# Patient Record
Sex: Male | Born: 1940 | Race: White | Hispanic: No | Marital: Single | Smoking: Former smoker
Health system: Southern US, Community
[De-identification: ages and names within clinical notes are randomized; demographics above are authoritative.]

## PROBLEM LIST (undated history)

## (undated) DIAGNOSIS — I5042 Chronic combined systolic (congestive) and diastolic (congestive) heart failure: Secondary | ICD-10-CM

## (undated) DIAGNOSIS — K5730 Diverticulosis of large intestine without perforation or abscess without bleeding: Secondary | ICD-10-CM

## (undated) DIAGNOSIS — J45909 Unspecified asthma, uncomplicated: Secondary | ICD-10-CM

## (undated) DIAGNOSIS — H547 Unspecified visual loss: Secondary | ICD-10-CM

## (undated) DIAGNOSIS — I4891 Unspecified atrial fibrillation: Secondary | ICD-10-CM

## (undated) DIAGNOSIS — R011 Cardiac murmur, unspecified: Secondary | ICD-10-CM

## (undated) DIAGNOSIS — K859 Acute pancreatitis without necrosis or infection, unspecified: Secondary | ICD-10-CM

## (undated) DIAGNOSIS — E119 Type 2 diabetes mellitus without complications: Secondary | ICD-10-CM

## (undated) DIAGNOSIS — J449 Chronic obstructive pulmonary disease, unspecified: Secondary | ICD-10-CM

## (undated) DIAGNOSIS — I350 Nonrheumatic aortic (valve) stenosis: Secondary | ICD-10-CM

## (undated) DIAGNOSIS — I6340 Cerebral infarction due to embolism of unspecified cerebral artery: Secondary | ICD-10-CM

## (undated) DIAGNOSIS — I499 Cardiac arrhythmia, unspecified: Secondary | ICD-10-CM

## (undated) DIAGNOSIS — H353190 Nonexudative age-related macular degeneration, unspecified eye, stage unspecified: Secondary | ICD-10-CM

## (undated) DIAGNOSIS — Z7409 Other reduced mobility: Secondary | ICD-10-CM

## (undated) DIAGNOSIS — R0609 Other forms of dyspnea: Secondary | ICD-10-CM

## (undated) DIAGNOSIS — R5381 Other malaise: Secondary | ICD-10-CM

## (undated) DIAGNOSIS — R0600 Dyspnea, unspecified: Secondary | ICD-10-CM

## (undated) DIAGNOSIS — D649 Anemia, unspecified: Secondary | ICD-10-CM

## (undated) DIAGNOSIS — G4733 Obstructive sleep apnea (adult) (pediatric): Secondary | ICD-10-CM

## (undated) DIAGNOSIS — Z953 Presence of xenogenic heart valve: Secondary | ICD-10-CM

## (undated) DIAGNOSIS — R609 Edema, unspecified: Secondary | ICD-10-CM

## (undated) DIAGNOSIS — M1990 Unspecified osteoarthritis, unspecified site: Secondary | ICD-10-CM

## (undated) DIAGNOSIS — Z8679 Personal history of other diseases of the circulatory system: Secondary | ICD-10-CM

## (undated) DIAGNOSIS — Z9889 Other specified postprocedural states: Secondary | ICD-10-CM

## (undated) DIAGNOSIS — I2510 Atherosclerotic heart disease of native coronary artery without angina pectoris: Secondary | ICD-10-CM

## (undated) DIAGNOSIS — I10 Essential (primary) hypertension: Secondary | ICD-10-CM

## (undated) DIAGNOSIS — I639 Cerebral infarction, unspecified: Secondary | ICD-10-CM

## (undated) DIAGNOSIS — R911 Solitary pulmonary nodule: Secondary | ICD-10-CM

## (undated) DIAGNOSIS — Z951 Presence of aortocoronary bypass graft: Secondary | ICD-10-CM

## (undated) DIAGNOSIS — E785 Hyperlipidemia, unspecified: Secondary | ICD-10-CM

## (undated) HISTORY — PX: TONSILLECTOMY: SUR1361

## (undated) HISTORY — DX: Edema, unspecified: R60.9

## (undated) HISTORY — PX: EYE SURGERY: SHX253

## (undated) HISTORY — DX: Nonrheumatic aortic (valve) stenosis: I35.0

## (undated) HISTORY — DX: Solitary pulmonary nodule: R91.1

## (undated) HISTORY — DX: Chronic combined systolic (congestive) and diastolic (congestive) heart failure: I50.42

## (undated) HISTORY — DX: Diverticulosis of large intestine without perforation or abscess without bleeding: K57.30

## (undated) HISTORY — DX: Obstructive sleep apnea (adult) (pediatric): G47.33

## (undated) HISTORY — DX: Other malaise: R53.81

## (undated) HISTORY — PX: CHOLECYSTECTOMY: SHX55

## (undated) HISTORY — DX: Other reduced mobility: Z74.09

## (undated) HISTORY — DX: Dyspnea, unspecified: R06.00

## (undated) HISTORY — DX: Anemia, unspecified: D64.9

## (undated) HISTORY — DX: Unspecified atrial fibrillation: I48.91

## (undated) HISTORY — DX: Nonexudative age-related macular degeneration, unspecified eye, stage unspecified: H35.3190

## (undated) HISTORY — DX: Morbid (severe) obesity due to excess calories: E66.01

## (undated) HISTORY — DX: Type 2 diabetes mellitus without complications: E11.9

## (undated) HISTORY — DX: Hyperlipidemia, unspecified: E78.5

## (undated) HISTORY — DX: Other forms of dyspnea: R06.09

## (undated) HISTORY — DX: Unspecified visual loss: H54.7

## (undated) HISTORY — DX: Atherosclerotic heart disease of native coronary artery without angina pectoris: I25.10

## (undated) HISTORY — DX: Unspecified osteoarthritis, unspecified site: M19.90

## (undated) HISTORY — DX: Chronic obstructive pulmonary disease, unspecified: J44.9

## (undated) HISTORY — DX: Acute pancreatitis without necrosis or infection, unspecified: K85.90

---

## 1995-03-02 DIAGNOSIS — I639 Cerebral infarction, unspecified: Secondary | ICD-10-CM

## 1995-03-02 HISTORY — DX: Cerebral infarction, unspecified: I63.9

## 2003-12-05 ENCOUNTER — Encounter: Payer: Self-pay | Admitting: Internal Medicine

## 2003-12-13 ENCOUNTER — Encounter: Payer: Self-pay | Admitting: Emergency Medicine

## 2003-12-19 ENCOUNTER — Encounter: Payer: Self-pay | Admitting: Internal Medicine

## 2008-04-18 ENCOUNTER — Encounter: Payer: Self-pay | Admitting: Internal Medicine

## 2009-07-04 ENCOUNTER — Encounter: Payer: Self-pay | Admitting: Internal Medicine

## 2009-08-07 LAB — HM COLONOSCOPY

## 2009-10-29 ENCOUNTER — Encounter: Payer: Self-pay | Admitting: Internal Medicine

## 2009-10-31 ENCOUNTER — Encounter: Payer: Self-pay | Admitting: Internal Medicine

## 2009-11-18 ENCOUNTER — Ambulatory Visit: Payer: Self-pay | Admitting: Internal Medicine

## 2009-11-24 DIAGNOSIS — H269 Unspecified cataract: Secondary | ICD-10-CM

## 2009-11-24 DIAGNOSIS — I2510 Atherosclerotic heart disease of native coronary artery without angina pectoris: Secondary | ICD-10-CM | POA: Insufficient documentation

## 2009-11-24 DIAGNOSIS — D649 Anemia, unspecified: Secondary | ICD-10-CM

## 2009-11-24 DIAGNOSIS — M25519 Pain in unspecified shoulder: Secondary | ICD-10-CM

## 2009-11-24 DIAGNOSIS — Z8719 Personal history of other diseases of the digestive system: Secondary | ICD-10-CM | POA: Insufficient documentation

## 2009-11-24 DIAGNOSIS — G4733 Obstructive sleep apnea (adult) (pediatric): Secondary | ICD-10-CM | POA: Insufficient documentation

## 2009-11-24 DIAGNOSIS — M1990 Unspecified osteoarthritis, unspecified site: Secondary | ICD-10-CM | POA: Insufficient documentation

## 2009-11-24 DIAGNOSIS — H353190 Nonexudative age-related macular degeneration, unspecified eye, stage unspecified: Secondary | ICD-10-CM | POA: Insufficient documentation

## 2009-11-24 DIAGNOSIS — Z9989 Dependence on other enabling machines and devices: Secondary | ICD-10-CM

## 2009-12-18 ENCOUNTER — Ambulatory Visit: Payer: Self-pay | Admitting: Internal Medicine

## 2009-12-27 ENCOUNTER — Encounter: Payer: Self-pay | Admitting: Internal Medicine

## 2009-12-31 ENCOUNTER — Ambulatory Visit: Payer: Self-pay | Admitting: Emergency Medicine

## 2010-01-13 ENCOUNTER — Ambulatory Visit: Payer: Self-pay | Admitting: Internal Medicine

## 2010-01-13 LAB — CONVERTED CEMR LAB
ALT: 14 units/L (ref 0–53)
Albumin: 3.8 g/dL (ref 3.5–5.2)
CO2: 31 meq/L (ref 19–32)
GFR calc non Af Amer: 61.65 mL/min (ref >60)
Glucose, Bld: 72 mg/dL (ref 70–99)
HDL: 33.7 mg/dL — ABNORMAL LOW (ref >39.00)
Potassium: 4.3 meq/L (ref 3.5–5.1)
Sodium: 138 meq/L (ref 135–145)
Total Bilirubin: 0.9 mg/dL (ref 0.3–1.2)
Total CHOL/HDL Ratio: 4
Triglycerides: 104 mg/dL (ref 0.0–149.0)
VLDL: 20.8 mg/dL (ref 0.0–40.0)

## 2010-01-15 ENCOUNTER — Encounter: Payer: Self-pay | Admitting: Internal Medicine

## 2010-02-02 ENCOUNTER — Telehealth: Payer: Self-pay | Admitting: Internal Medicine

## 2010-02-05 ENCOUNTER — Encounter: Payer: Self-pay | Admitting: Emergency Medicine

## 2010-02-09 ENCOUNTER — Ambulatory Visit: Payer: Self-pay | Admitting: Internal Medicine

## 2010-02-09 ENCOUNTER — Telehealth: Payer: Self-pay | Admitting: Internal Medicine

## 2010-02-11 ENCOUNTER — Ambulatory Visit: Payer: Self-pay | Admitting: Emergency Medicine

## 2010-02-27 ENCOUNTER — Telehealth (INDEPENDENT_AMBULATORY_CARE_PROVIDER_SITE_OTHER): Payer: Self-pay | Admitting: *Deleted

## 2010-03-17 ENCOUNTER — Ambulatory Visit: Payer: Self-pay | Admitting: Internal Medicine

## 2010-03-19 ENCOUNTER — Encounter (INDEPENDENT_AMBULATORY_CARE_PROVIDER_SITE_OTHER): Payer: Self-pay | Admitting: *Deleted

## 2010-03-19 DIAGNOSIS — K1120 Sialoadenitis, unspecified: Secondary | ICD-10-CM | POA: Insufficient documentation

## 2010-03-23 ENCOUNTER — Ambulatory Visit: Admit: 2010-03-23 | Payer: Self-pay | Admitting: Internal Medicine

## 2010-03-23 ENCOUNTER — Encounter: Payer: Self-pay | Admitting: Emergency Medicine

## 2010-03-24 ENCOUNTER — Encounter (INDEPENDENT_AMBULATORY_CARE_PROVIDER_SITE_OTHER): Payer: Self-pay | Admitting: *Deleted

## 2010-03-31 ENCOUNTER — Encounter: Payer: Self-pay | Admitting: Internal Medicine

## 2010-03-31 NOTE — Medication Information (Signed)
Summary: Glucose Supplies/[facility]  Glucose Supplies/[facility]   Imported By: Sherian Rein 11/25/2009 09:14:41  _____________________________________________________________________  External Attachment:    Type:   Image     Comment:   External Document

## 2010-03-31 NOTE — Letter (Signed)
Summary: CMN/[facility]  CMN/[facility]   Imported By: Lester Cumberland 12/30/2009 09:39:20  _____________________________________________________________________  External Attachment:    Type:   Image     Comment:   External Document

## 2010-03-31 NOTE — Assessment & Plan Note (Signed)
Summary: NEW PT /MEDICARE / [name]  #   Vital Signs:  Patient profile:   70 year old male Height:      73 inches Weight:      326 pounds BMI:     43.17 O2 Sat:      93 % on Room air Temp:     97.1 degrees F oral Pulse rate:   68 / minute BP sitting:   142 / 62  (left arm) Cuff size:   large  Vitals Entered By: Ami Bullins CMA (November 18, 2009 1:21 PM)  O2 Flow:  Room air CC: New pt here to est care with primary md/ ab   Primary Care Provider:  Jacques Navy MD  CC:  New pt here to est care with primary md/ ab.  History of Present Illness: Patient presents to [facility] for on-going care.   Patient is diabetic - he is well supplied with medications but needs insulin.  He is moving from [place] , [place] - he is a native of [place] and has decided to return home.   He reports that he is medically stable but does suffer from SOB and has some difficulty with steps/stairs.  He remains fully independent in all ADLs and manages all his affairs, including rental property and investment accounts..   Preventive Screening-Counseling & Management  Alcohol-Tobacco     Alcohol drinks/day: <1     Alcohol type: wine, beer,scotch     >5/day in last 3 mos: yes     Smoking Status: quit     Year Quit: 1996  Caffeine-Diet-Exercise     Caffeine use/day: 1 cup daily     Diet Comments: diabetic diet     Does Patient Exercise: no     MSH Depression Score: no depression  Hep-HIV-STD-Contraception     Hepatitis Risk: no risk noted     HIV Risk: no risk noted     STD Risk Counseling: not indicated-no STD risk noted     Dental Visit-last 6 months no     Dental Care Counseling: to seek dental care; no dental care within six months     Sun Exposure-Excessive: no  Safety-Violence-Falls     Seat Belt Use: yes     Helmet Use: yes     Firearms in the Home: no firearms in the home     Smoke Detectors: yes     Violence in the Home: no risk noted     Sexual Abuse: no      Drug  Use:  never.        Blood Transfusions:  yes and after 2001.    Current Medications (verified): 1)  Dorzolamide Hcl 2 % Soln (Dorzolamide Hcl) .Marland Kitchen.. 1 Drop Right Eye Once Daily 2)  Novolin N 100 Unit/ml Susp (Insulin Isophane Human) .... Inject 15 Units Subcutaneously Two Times A Day 3)  Onetouch Test  Strp (Glucose Blood) .Marland Kitchen.. 1 Strip Three Times A Day 4)  Proair Hfa 108 (90 Base) Mcg/act Aers (Albuterol Sulfate) .... Two Puffs Q 4 To 6 Hours 5)  Atenolol 50 Mg Tabs (Atenolol) .... One Tab At Bedtime 6)  Hydrochlorothiazide 25 Mg Tabs (Hydrochlorothiazide) .... One Tab Once Daily 7)  Lisinopril 20 Mg Tabs (Lisinopril) .... One Tab Daily 8)  Metformin Hcl 500 Mg Tabs (Metformin Hcl) .... One Tab Qam and Two Tabs Qpm 9)  Novolin R 100 Unit/ml Soln (Insulin Regular Human) .... Inject 15-20 Units Sub Q Twice Daily Before Breakfast  and Before Dinner 10)  Simvastatin 40 Mg Tabs (Simvastatin) .... Take One Tab Qpm 11)  Ofloxacin 0.3 % Soln (Ofloxacin) .... One Drop Three Times A Day After A Shot 12)  Spiriva Handihaler 18 Mcg Caps (Tiotropium Bromide Monohydrate) .... 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 Smoking Status:  quit Caffeine use/day:  1 cup daily Does Patient Exercise:  no Dental Care w/in 6 mos.:  no Sun Exposure-Excessive:  no Seat Belt Use:  yes Drug Use:  never Hepatitis Risk:  no risk noted HIV Risk:  no risk noted Blood Transfusions:  yes, after 2001  Review of Systems       The patient complains of difficulty walking.  The patient denies anorexia, fever, weight loss, weight gain, decreased hearing, hoarseness, chest pain, syncope, dyspnea on exertion, prolonged cough, headaches, abdominal pain, severe indigestion/heartburn, incontinence, suspicious skin lesions, depression, unusual weight change, abnormal bleeding, enlarged lymph nodes, and testicular masses.    Physical Exam  General:  obese white male in no distress Head:  Normocephalic and atraumatic without obvious abnormalities. No apparent alopecia or balding. Eyes:  C&S injected OD, pupil round and reactive. Blind OS Ears:  R ear normal and L ear normal.   Mouth:  partial dentures, nor oral lesions. Throat clear Neck:  supple, full ROM, no thyromegaly, and no carotid bruits.   Chest Wall:  No deformities, masses, tenderness or gynecomastia noted. Lungs:  Normal respiratory effort, chest expands symmetrically. Lungs are clear to auscultation, no crackles or wheezes. Heart:  Normal rate and regular rhythm. S1 and S2 normal without gallop, murmur, click, rub or other extra sounds. Abdomen:  obese,soft and normal bowel sounds.   Msk:  no joint swelling, no joint warmth, no redness over joints, and no joint deformities.   Pulses:  2+ radial Neurologic:  alert & oriented X3, cranial nerves II-XII intact except for blindness OS, and gait normal.   Skin:  turgor normal and color normal.   Cervical Nodes:  no anterior cervical adenopathy and no posterior cervical adenopathy.   Psych:  Oriented X3,  normally interactive, good eye contact, and not anxious appearing.    Diabetes Management Exam:    Eye Exam:       Eye Exam done elsewhere          Date: 11/17/2009          Results: normal          Done by: Dr Allyne Gee   Impression & Recommendations:  Problem # 1:  OBESITY, MORBID (ICD-278.01) Discussed weight management: smar food choices, PORTION SIZE CONTROL, and regular exercise.  Problem # 2:  COPD (ICD-496) He does report DOE. No Increased work of breathing at rest.  Plan - continue present medications.  His updated medication list for this problem includes:    Proair Hfa 108 (  90 Base) Mcg/act Aers (Albuterol sulfate) .Marland Kitchen..Marland Kitchen Two puffs q 4 to 6 hours    Spiriva Handihaler 18 Mcg Caps (Tiotropium bromide monohydrate) .Marland Kitchen... As needed  Problem # 3:  OBSTRUCTIVE SLEEP APNEA (ICD-327.23) Using CPAP and had recent titration study. 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 Had a negative PET scan. Will need annual follow-up: last study 04/18/08  Problem # 6:  OTHER AND UNSPECIFIED HYPERLIPIDEMIA (ICD-272.4) Will look for recent lab: needs q6 months follow-up  His updated medication list for this problem includes:    Simvastatin 40 Mg Tabs (Simvastatin) .Marland Kitchen... Take one tab qpm  Problem # 7:  DIABETES-TYPE 2 (ICD-250.00) On complex regimen but by his report he has been controlled and stable. Will need to review recent lab and he will need q 3-6 month follow-up.  His updated medication list for this problem includes:    Novolin N 100 Unit/ml Susp (Insulin isophane human) ..... Inject 15 units subcutaneously two times a day    Lisinopril 20 Mg Tabs (Lisinopril) ..... One tab daily    Metformin Hcl 500 Mg Tabs (Metformin hcl) ..... One tab qam and two tabs qpm    Novolin  R 100 Unit/ml Soln (Insulin regular human) ..... Inject 15-20 units sub q twice daily before breakfast and before dinner  Problem # 8:  CAD (ICD-414.00) Many risk factors and he does appear to have a program for risk reduction. At some point will need to establish with cardiology.  His updated medication list for this problem includes:    Atenolol 50 Mg Tabs (Atenolol) ..... One tab at bedtime    Hydrochlorothiazide 25 Mg Tabs (Hydrochlorothiazide) ..... One tab once daily    Lisinopril 20 Mg Tabs (Lisinopril) ..... One tab daily  Problem # 9:  Preventive Health Care (ICD-V70.0)  Current with colonscopy, current with opthalmology. Will be obtaining lab reports and will order lab as appropriate. His immunizations are up to date.  He has no signs of depression and has a good attitude. He is cognitively intact - see HPI. He has fall risk related to obesity and visual loss and is counselled on safety and fall risk reduction. He is counselled on diet management.   In summary- a nice man who presents to establish for on-going care. He will return in 4-8 weeks for follow-up.  Orders: [facility] -Subsequent Annual Wellness Visit [phone])  Complete Medication List: 1)  Dorzolamide Hcl 2 % Soln (Dorzolamide hcl) .Marland Kitchen.. 1 drop right eye once daily 2)  Novolin N 100 Unit/ml Susp (Insulin isophane human) .... Inject 15 units subcutaneously two times a day 3)  Onetouch Test Strp (Glucose blood) .Marland Kitchen.. 1 strip three times a day 4)  Proair Hfa 108 (90 Base) Mcg/act Aers (Albuterol sulfate) .... Two puffs q 4 to 6 hours 5)  Atenolol 50 Mg Tabs (Atenolol) .... One tab at bedtime 6)  Hydrochlorothiazide 25 Mg Tabs (Hydrochlorothiazide) .... One tab once daily 7)  Lisinopril 20 Mg Tabs (Lisinopril) .... One tab daily 8)  Metformin Hcl 500 Mg Tabs (Metformin hcl) .... One tab qam and two tabs qpm 9)  Novolin R 100 Unit/ml Soln (Insulin regular human) .... Inject 15-20 units sub q twice daily before breakfast and before  dinner 10)  Simvastatin 40 Mg Tabs (Simvastatin) .... Take one tab qpm  11)  Ofloxacin 0.3 % Soln (Ofloxacin) .... One drop three times a day after a shot 12)  Spiriva Handihaler 18 Mcg Caps (Tiotropium bromide monohydrate) .... As needed  Other Orders: Flu Vaccine 59yrs + MEDICARE PATIENTS (Q5956) Administration Flu vaccine - MCR (L8756) Prescriptions: SPIRIVA HANDIHALER 18 MCG CAPS (TIOTROPIUM BROMIDE MONOHYDRATE) as needed  #1 x 12   Entered by:   Ami Bullins CMA   Authorized by:   Jacques Navy MD   Signed by:   Bill Salinas CMA on 11/18/2009   Method used:   Electronically to        [facility]* (retail)       [address]       [place], [place]  [address]       Ph: [phone]       Fax: [phone]   RxID:   [id number] SIMVASTATIN 40 MG TABS (SIMVASTATIN) take one tab qpm  #30 x 12   Entered by:   Ami Bullins CMA   Authorized by:   Jacques Navy MD   Signed by:   Bill Salinas CMA on 11/18/2009   Method used:   Electronically to        [facility]* (retail)       [address]       [place], [place]  [address]       Ph: [phone]       Fax: [phone]   RxID:   [id number] NOVOLIN R 100 UNIT/ML SOLN (INSULIN REGULAR HUMAN) inject 15-20 units sub q twice daily before breakfast and before dinner  #1 month supp x 12   Entered by:   Ami Bullins CMA   Authorized by:   Jacques Navy MD   Signed by:   Bill Salinas CMA on 11/18/2009   Method used:   Electronically to        [facility]* (retail)       [address]       [place], [place]  [address]       Ph: [phone]       Fax: [phone]   RxID:   [id number] METFORMIN HCL 500 MG TABS (METFORMIN HCL) one tab qam and two tabs qpm  #90 x 12   Entered by:   Ami Bullins CMA   Authorized by:   Jacques Navy MD   Signed by:   Bill Salinas CMA on 11/18/2009   Method used:   Electronically to        [facility]* (retail)       [address]       [place], [place]  [address]       Ph: [phone]       Fax: [phone]   RxID:   [id number] LISINOPRIL 20 MG TABS (LISINOPRIL) one tab daily  #30 x 12   Entered by:   Ami Bullins CMA   Authorized by:   Jacques Navy MD   Signed by:   Bill Salinas CMA on 11/18/2009   Method used:   Electronically to        [facility]* (retail)       [address]       [place], [place]  [address]       Ph: [phone]       Fax: [phone]   RxID:   [id number] HYDROCHLOROTHIAZIDE 25 MG TABS (HYDROCHLOROTHIAZIDE) one tab once daily  #30 x 12  Entered by:   Bill Salinas CMA   Authorized by:   Jacques Navy MD   Signed by:   Bill Salinas CMA on 11/18/2009   Method used:   Electronically to        [facility]* (retail)       [address]       [place], [place]  [address]       Ph: [phone]       Fax: [phone]   RxID:   [id number] ATENOLOL 50 MG TABS (ATENOLOL) one tab at bedtime  #30 x 12   Entered by:   Ami Bullins CMA   Authorized by:   Jacques Navy MD   Signed by:   Bill Salinas CMA on 11/18/2009   Method used:   Electronically to        [facility]* (retail)       [address]       [place], [place]  [address]       Ph: [phone]       Fax: [phone]   RxID:   [id number] PROAIR HFA 108 (90 BASE) MCG/ACT AERS (ALBUTEROL SULFATE) two puffs q 4 to 6 hours  #1 x 12   Entered by:   Ami Bullins CMA   Authorized by:   Jacques Navy MD   Signed by:   Bill Salinas CMA on 11/18/2009   Method used:   Electronically to        [facility]* (retail)       [address]       [place], [place]  [address]       Ph: [phone]       Fax: [phone]   RxID:   [id number] [facility] TEST  STRP (GLUCOSE BLOOD) 1 strip three times a day  #1 month supp x 12   Entered by:   Ami Bullins CMA   Authorized by:   Jacques Navy MD   Signed by:   Bill Salinas CMA on 11/18/2009    Method used:   Electronically to        [facility]* (retail)       [address]       [place], [place]  [address]       Ph: [phone]       Fax: [phone]   RxID:   [id number] NOVOLIN N 100 UNIT/ML SUSP (INSULIN ISOPHANE HUMAN) Inject 15 units subcutaneously two times a day  #1 month supp x 12   Entered by:   Ami Bullins CMA   Authorized by:   Jacques Navy MD   Signed by:   Bill Salinas CMA on 11/18/2009   Method used:   Electronically to        [facility]* (retail)       [address]       [place], [place]  [address]       Ph: [phone]       Fax: [phone]   RxID:   [id number]    Preventive Care Screening  Last Flu Shot:    Date:  03/22/2008    Results:  given   Colonoscopy:    Date:  07/27/2007    Results:  Adenomatous Polyp   Last Pneumovax:    Date:  11/22/2006    Results:  given   Last Tetanus Booster:    Date:  10/02/2003    Results:  Historical  Flu Vaccine Consent Questions     Do you have a history of severe allergic reactions to this vaccine? no    Any prior history of allergic reactions to egg and/or gelatin? no    Do you have a sensitivity to the preservative Thimersol? no    Do you have a past history of Guillan-Barre Syndrome? no    Do you currently have an acute febrile illness? no    Have you ever had a severe reaction to latex? no    Vaccine information given and explained to patient? yes    Are you currently pregnant? no    Lot Number:[id number]   Exp Date:08/29/2010   Site Given  Left Deltoid IMedflu

## 2010-03-31 NOTE — Miscellaneous (Signed)
Summary: Orders Update  Clinical Lists Changes  Orders: Added new Referral order of Sleep Disorder Referral (Sleep Disorder) - Signed 

## 2010-03-31 NOTE — Miscellaneous (Signed)
Summary: [facility]   Imported By: Sherian Rein 11/28/2009 10:45:52  _____________________________________________________________________  External Attachment:    Type:   Image     Comment:   External Document

## 2010-03-31 NOTE — Progress Notes (Signed)
Summary: REQ FOR RX  Phone Note Call from Patient Call back at [phone]   Summary of Call: Patient is requesting rx to help with "flare" of rosacea.  Initial call taken by: Lamar Sprinkles, CMA,  February 02, 2010 2:10 PM    New/Updated Medications: METROGEL 1 % GEL (METRONIDAZOLE) apply daily to affected area of rosacea. May d/c when flare is under control. Prescriptions: METROGEL 1 % GEL (METRONIDAZOLE) apply daily to affected area of rosacea. May d/c when flare is under control.  #60g x 2   Entered and Authorized by:   Jacques Navy MD   Signed by:   Jacques Navy MD on 02/02/2010   Method used:   Electronically to        [facility]* (retail)       [address]       [place], [place]  [address]       Ph: [phone]       Fax: [phone]   RxID:   [id number]

## 2010-03-31 NOTE — Assessment & Plan Note (Signed)
Summary: 1 mos f.u    Vital Signs:  Patient profile:   70 year old male Height:      73 inches Weight:      321 pounds BMI:     42.50 O2 Sat:      94 % on Room air Temp:     97.4 degrees F oral Pulse rate:   67 / minute BP sitting:   124 / 64  (left arm) Cuff size:   large  Vitals Entered By: Bill Salinas CMA (December 18, 2009 2:14 PM)  O2 Flow:  Room air  Primary Care Breken Nazari:  Jacques Navy MD   History of Present Illness: Patient recently seen as a new patient. He reports to additions or corrections to the history as mailed to him. He has brought recent lab reports from my review. He has no additional problems or concerns.  Current Medications (verified): 1)  Dorzolamide Hcl 2 % Soln (Dorzolamide Hcl) .Marland Kitchen.. 1 Drop Right Eye Once Daily 2)  Novolin N 100 Unit/ml Susp (Insulin Isophane Human) .... Inject 20 Units Subcutaneously Every Morning and 30 Units At Bedtime 3)  Onetouch Test  Strp (Glucose Blood) .Marland Kitchen.. 1 Strip Three Times A Day 4)  Proair Hfa 108 (90 Base) Mcg/act Aers (Albuterol Sulfate) .... Two Puffs Q 4 To 6 Hours 5)  Atenolol 50 Mg Tabs (Atenolol) .... One Tab At Bedtime 6)  Hydrochlorothiazide 25 Mg Tabs (Hydrochlorothiazide) .... One Tab Once Daily 7)  Lisinopril 20 Mg Tabs (Lisinopril) .... One Tab Daily 8)  Metformin Hcl 500 Mg Tabs (Metformin Hcl) .... One Tab Qam and Two Tabs Qpm 9)  Novolin R 100 Unit/ml Soln (Insulin Regular Human) .... Inject 15-20 Units Sub Q Twice Daily Before Breakfast and Before Dinner and 5-15 At Bedtime 10)  Simvastatin 40 Mg Tabs (Simvastatin) .... Take One Tab Qpm 11)  Ofloxacin 0.3 % Soln (Ofloxacin) .... One Drop Three Times A Day After A Shot 12)  Spiriva Handihaler 18 Mcg Caps (Tiotropium Bromide Monohydrate) .... As Needed 13)  Fish Oil 1000 Mg Caps (Omega-3 Fatty Acids) .Marland Kitchen.. 1 Capsule Once Daily 14)  Preservision/lutein  Caps (Multiple Vitamins-Minerals) .... 2 Capsules Daily  Allergies (verified): No Known Drug  Allergies PMH-FH-SH reviewed-no changes except otherwise noted  Physical Exam  General:  obese white male in no distress Eyes:  vision grossly intact, pupils equal, pupils round, and corneas and lenses clear.   Lungs:  normal respiratory effort.   Heart:  normal rate and regular rhythm.     Impression & Recommendations:  Problem # 1:  OBESITY, MORBID (ICD-278.01) reviewed labs and previous notes. He is encouraged to follow a weight management program  Problem # 2:  COPD (ICD-496)  He will continue his present regimen. He does need to establish with a pulmonary doctor.  His updated medication list for this problem includes:    Proair Hfa 108 (90 Base) Mcg/act Aers (Albuterol sulfate) .Marland Kitchen..Marland Kitchen Two puffs q 4 to 6 hours    Spiriva Handihaler 18 Mcg Caps (Tiotropium bromide monohydrate) .Marland Kitchen... As needed  Orders: Pulmonary Referral (Pulmonary)  Complete Medication List: 1)  Dorzolamide Hcl 2 % Soln (Dorzolamide hcl) .Marland Kitchen.. 1 drop right eye once daily 2)  Novolin N 100 Unit/ml Susp (Insulin isophane human) .... Inject 20 units subcutaneously every morning and 30 units at bedtime 3)  Onetouch Test Strp (Glucose blood) .Marland Kitchen.. 1 strip three times a day 4)  Proair Hfa 108 (90 Base) Mcg/act Aers (Albuterol sulfate) .... Two  puffs q 4 to 6 hours 5)  Atenolol 50 Mg Tabs (Atenolol) .... One tab at bedtime 6)  Hydrochlorothiazide 25 Mg Tabs (Hydrochlorothiazide) .... One tab once daily 7)  Lisinopril 20 Mg Tabs (Lisinopril) .... One tab daily 8)  Metformin Hcl 500 Mg Tabs (Metformin hcl) .... One tab qam and two tabs qpm 9)  Novolin R 100 Unit/ml Soln (Insulin regular human) .... Inject 15-20 units sub q twice daily before breakfast and before dinner and 5-15 at bedtime 10)  Simvastatin 40 Mg Tabs (Simvastatin) .... Take one tab qpm 11)  Ofloxacin 0.3 % Soln (Ofloxacin) .... One drop three times a day after a shot 12)  Spiriva Handihaler 18 Mcg Caps (Tiotropium bromide monohydrate) .... As needed 13)   Fish Oil 1000 Mg Caps (Omega-3 fatty acids) .Marland Kitchen.. 1 capsule once daily 14)  Preservision/lutein Caps (Multiple vitamins-minerals) .... 2 capsules daily Prescriptions: SIMVASTATIN 40 MG TABS (SIMVASTATIN) take one tab qpm  #90 x 3   Entered and Authorized by:   Jacques Navy MD   Signed by:   Jacques Navy MD on 12/18/2009   Method used:   Electronically to        [facility]* (retail)       [address]       [place], [place]  [address]       Ph: [phone]       Fax: [phone]   RxID:   [id number] METFORMIN HCL 500 MG TABS (METFORMIN HCL) one tab qam and two tabs qpm  #180 x 3   Entered and Authorized by:   Jacques Navy MD   Signed by:   Jacques Navy MD on 12/18/2009   Method used:   Electronically to        [facility]* (retail)       [address]       [place], [place]  [address]       Ph: [phone]       Fax: [phone]   RxID:   [id number] LISINOPRIL 20 MG TABS (LISINOPRIL) one tab daily  #90 x 3   Entered and Authorized by:   Jacques Navy MD   Signed by:   Jacques Navy MD on 12/18/2009   Method used:   Electronically to        [facility]* (retail)       [address]       [place], [place]  [address]       Ph: [phone]       Fax: [phone]   RxID:   [id number] HYDROCHLOROTHIAZIDE 25 MG TABS (HYDROCHLOROTHIAZIDE) one tab once daily  #90 x 3   Entered and Authorized by:   Jacques Navy MD   Signed by:   Jacques Navy MD on 12/18/2009   Method used:   Electronically to        [facility]* (retail)       [address]       [place], [place]  [address]       Ph: [phone]       Fax: [phone]   RxID:   [id number] ATENOLOL 50 MG TABS (ATENOLOL) one tab at bedtime  #90 x 3   Entered and Authorized by:   Jacques Navy MD   Signed by:   Jacques Navy MD on 12/18/2009   Method used:   Electronically to  [facility]*  (retail)       [address]       [place], [place]  [address]       Ph: [phone]       Fax: [phone]   RxID:   [id number] NOVOLIN R 100 UNIT/ML SOLN (INSULIN REGULAR HUMAN) inject 15-20 units sub q twice daily before breakfast and before dinner and 5-15 at bedtime  #3 vials x 12   Entered and Authorized by:   Jacques Navy MD   Signed by:   Jacques Navy MD on 12/18/2009   Method used:   Electronically to        [facility]* (retail)       [address]       [place], [place]  [address]       Ph: [phone]       Fax: [phone]   RxID:   [id number] NOVOLIN N 100 UNIT/ML SUSP (INSULIN ISOPHANE HUMAN) Inject 20 units subcutaneously every morning and 30 units at bedtime  #3 vials x 12   Entered and Authorized by:   Jacques Navy MD   Signed by:   Jacques Navy MD on 12/18/2009   Method used:   Electronically to        [facility]* (retail)       [address]       [place], [place]  [address]       Ph: [phone]       Fax: [phone]   RxID:   [id number]    Orders Added: 1)  Pulmonary Referral [Pulmonary] 2)  Est. Patient Level II [[address]]

## 2010-03-31 NOTE — Letter (Signed)
[facility] [address] [place], [place]  [address] Phone: [phone]      January 15, 2010   [name] [address] [place], [place] [address]  RE:  LAB RESULTS  Dear  Mr. Beilke,  The following is an interpretation of your most recent lab tests.  Please take note of any instructions provided or changes to medications that have resulted from your lab work.  ELECTROLYTES:  Good - no changes needed  KIDNEY FUNCTION TESTS:  Good - no changes needed  LIVER FUNCTION TESTS:  Good - no changes needed  LIPID PANEL:  Good - no changes needed Triglyceride: 104.0   Cholesterol: 150   LDL: 96   HDL: 33.70   Chol/HDL%:  4  DIABETIC STUDIES:  Good - no changes needed Blood Glucose: 72   HgbA1C: 7.4     Lab results look fine. Please come see me if you have any questions about these lab results.   Sincerely Yours,    Jacques Navy MD  Patient: Charles [name] Note: All result statuses are Final unless otherwise noted.  Tests: (1) Hemoglobin A1C (A1C)   Hemoglobin A1C       [H]  7.4 %                       4.6-6.5     Glycemic Control Guidelines for People with Diabetes:     Non Diabetic:  <6%     Goal of Therapy: <7%     Additional Action Suggested:  >8%   Tests: (2) Lipid Panel (LIPID)   Cholesterol               150 mg/dL                   5-732     ATP III Classification            Desirable:  < 200 mg/dL                    Borderline High:  200 - 239 mg/dL               High:  > = 240 mg/dL   Triglycerides             104.0 mg/dL                 2.0-254.2     Normal:  <150 mg/dL     Borderline High:  706 - 199 mg/dL   HDL                  [L]  23.76 mg/dL                 >28.31   VLDL Cholesterol          20.8 mg/dL                  5.1-76.1   LDL Cholesterol           96 mg/dL                    6-07  CHO/HDL Ratio:  CHD Risk                             4  Men          Women     1/2 Average Risk     3.4           3.3     Average Risk          5.0          4.4     2X Average Risk          9.6          7.1     3X Average Risk          15.0          11.0                           Tests: (3) Hepatic/Liver Function Panel (HEPATIC)   Total Bilirubin           0.9 mg/dL                   0.9-8.1   Direct Bilirubin          0.2 mg/dL                   1.9-1.4   Alkaline Phosphatase      61 U/L                      39-117   AST                       31 U/L                      0-37   ALT                       14 U/L                      0-53   Total Protein             6.6 g/dL                    7.8-2.9   Albumin                   3.8 g/dL                    5.6-2.1  Tests: (4) BMP (METABOL)   Sodium                    138 mEq/L                   135-145   Potassium                 4.3 mEq/L                   3.5-5.1   Chloride                  98 mEq/L                    96-112   Carbon Dioxide            31 mEq/L                    19-32   Glucose  72 mg/dL                    16-10   BUN                       23 mg/dL                    9-60   Creatinine                1.2 mg/dL                   4.5-4.0   Calcium                   9.1 mg/dL                   9.8-11.9   GFR                       61.65 mL/min                >60

## 2010-03-31 NOTE — Assessment & Plan Note (Signed)
Summary: COPD, nodule, OSA   Visit Type:  Initial Consult Copy to:  Dr. Debby Bud Primary Provider/Referring Provider:  Jacques Navy MD  CC:  Pulmonary consult for COPD...the patient c/o increased SOB with exertion.  History of Present Illness: 70 yo former smoker, hx obesity, DM, HTN, OSA on CPAP 8. Also hx gallstone pancreatitis s/p chole in the past. Dx with COPD by pulmonologist in [place] many yrs ago. Has been on combivent, more recently Spiriva + ProAir. He doesn't take the Spiriva daily, doesn't see that it helps him. he does use ProAir, feels it helps him clear sputum. Also was followed for pulm nodule, he reports was stable on serial CTs over 5 yrs.   Able to walk around the house, but has to stop to rest if he walks over a block, climbs stairs. His last PFTs were done 6 -7  mo ago in [place]. Good compliance w CPAP, pressure never rechecked since '97.   Preventive Screening-Counseling & Management  Alcohol-Tobacco     Alcohol drinks/day: <1     Smoking Status: quit     Smoking Cessation Counseling: yes     Smoke Cessation Stage: quit     Packs/Day: 2.0     Year Started: 1958     Year Quit: 1996     Tobacco Counseling: to remain off tobacco products  Current Medications (verified): 1)  Dorzolamide Hcl 2 % Soln (Dorzolamide Hcl) .Marland Kitchen.. 1 Drop Right Eye Once Daily 2)  Novolin N 100 Unit/ml Susp (Insulin Isophane Human) .... Inject 20 Units Subcutaneously Every Morning and 30 Units At Bedtime 3)  Onetouch Test  Strp (Glucose Blood) .Marland Kitchen.. 1 Strip Three Times A Day 4)  Proair Hfa 108 (90 Base) Mcg/act Aers (Albuterol Sulfate) .... Two Puffs Q 4 To 6 Hours 5)  Atenolol 50 Mg Tabs (Atenolol) .... One Tab At Bedtime 6)  Hydrochlorothiazide 25 Mg Tabs (Hydrochlorothiazide) .... One Tab Once Daily 7)  Lisinopril 20 Mg Tabs (Lisinopril) .... One Tab Daily 8)  Metformin Hcl 500 Mg Tabs (Metformin Hcl) .... One Tab Qam and Two Tabs Qpm 9)  Novolin R 100 Unit/ml Soln (Insulin Regular Human) ....  Inject 15-20 Units Sub Q Twice Daily Before Breakfast and Before Dinner and 5-15 At Bedtime 10)  Simvastatin 40 Mg Tabs (Simvastatin) .... Take One Tab Qpm 11)  Ofloxacin 0.3 % Soln (Ofloxacin) .... One Drop Three Times A Day After A Shot 12)  Spiriva Handihaler 18 Mcg Caps (Tiotropium Bromide Monohydrate) .... As Needed 13)  Fish Oil 1000 Mg Caps (Omega-3 Fatty Acids) .Marland Kitchen.. 1 Capsule Once Daily 14)  Preservision/lutein  Caps (Multiple Vitamins-Minerals) .... 2 Capsules Daily 15)  Avastin 100 Mg/45ml Soln (Bevacizumab) .... Ocular Injection Every 4 Weeks  Allergies (verified): No Known Drug Allergies  Family History: Reviewed history from 11/18/2009 and no changes required. Father - deceased @ 84: metatstatic cancer; CHF Mogther - deceased @ 52: CAD/MI, HTN, DM Neg- colon or prostate cancer  Social History: Reviewed history from 11/18/2009 and no changes required. 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 Packs/Day:  2.0  Review of Systems       has been trying to lose wt.   Vital Signs:  Patient profile:   70 year old male Height:      73 inches (185.42 cm) Weight:      325.38 pounds (147.90 kg) BMI:     43.08 O2 Sat:  95 % on Room air Temp:     98.3 degrees F (36.83 degrees C) oral Pulse rate:   66 / minute BP sitting:   128 / 64  (left arm) Cuff size:   large  Vitals Entered By: Michel Bickers CMA (December 31, 2009 2:52 PM)  O2 Sat at Rest %:  95 O2 Flow:  Room air CC: Pulmonary consult for COPD...the patient c/o increased SOB with exertion Is Patient Diabetic? Yes Comments Medications reviewed with patient Michel Bickers CMA  December 31, 2009 3:04 PM   Physical Exam  General:  pleasant man, obese, comfortable on RA Head:  normocephalic and atraumatic Eyes:  conjunctiva and sclera clear Nose:  erythematous, no drainage Mouth:  no deformity or lesions Neck:  no  masses, thyromegaly, or abnormal cervical nodes Lungs:  distant, very soft end-exp wheeze Heart:  regular rate and rhythm, S1, S2 without murmurs, rubs, gallops, or clicks Extremities:  trace ankle edema Neurologic:  non-focal Psych:  alert and cooperative; normal mood and affect; normal attention span and concentration   Impression & Recommendations:  Problem # 1:  COPD (ICD-496)  Problem # 2:  OBESITY, MORBID (ICD-278.01)  Orders: Consultation Level IV (60454)  Problem # 3:  PULMONARY NODULE, 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 Ocular injection every 4 weeks  Patient Instructions: 1)  Stop your Spiriva for now 2)  Continue your ProAir as needed  3)  We will ask [facility] to perform a CPAP auto-titration 4)  We will obtain your PFT's from [facility] 5)  We will review your CT scans of the chest and decide when or if you need follow up studies.  6)  Follow with Dr Delton Coombes in 1 month or as needed

## 2010-03-31 NOTE — Letter (Signed)
Summary: Pt's Hx/[facility]  Pt's Hx/[facility]   Imported By: Sherian Rein 11/28/2009 10:57:17  _____________________________________________________________________  External Attachment:    Type:   Image     Comment:   External Document

## 2010-04-02 NOTE — Assessment & Plan Note (Signed)
Summary: COPD, OSA, RUL nodule   Visit Type:  Follow-up Copy to:  Dr. Debby Bud Primary Charles [name]/Referring Delawrence Fridman:  Jacques Navy MD  CC:  COPD...OSA.Marland KitchenMarland KitchenPt says his breathing is doing well...discuss cpap auto titrate.  History of Present Illness: 70 yo former smoker, hx obesity, DM, HTN, OSA on CPAP 8. Also hx gallstone pancreatitis s/p chole in the past. Dx with COPD by pulmonologist in [place] many yrs ago. Has been on combivent, more recently Spiriva + ProAir. He doesn't take the Spiriva daily, doesn't see that it helps him. he does use ProAir, feels it helps him clear sputum. Also was followed for pulm nodule, he reports was stable on serial CTs over 5 yrs.   Able to walk around the house, but has to stop to rest if he walks over a block, climbs stairs. His last PFTs were done 6 -7  mo ago in [place]. Good compliance w CPAP, pressure never rechecked since '97.   ROV 02/11/10 -- follows up today for OSA/OHS, COPD. Last time we stopped Spiriva because he was only using as needed. He occas uses ProAir (about qod), helps him clear phlegm. Auto-titration CPAP study performed and shows that optimal CPAP is 14cmH2O (currently set on 8). CT scans available for review dating back to 2005 - There has been very little change up to last one done in 11/01/08: In fact it looks smaller to me (1.6x1.5cm from 1.8x1.5).   Current Medications (verified): 1)  Dorzolamide Hcl 2 % Soln (Dorzolamide Hcl) .Marland Kitchen.. 1 Drop Right Eye Once Daily 2)  Novolin N 100 Unit/ml Susp (Insulin Isophane Human) .... Inject 20 Units Subcutaneously Every Morning and 30 Units At Bedtime 3)  Onetouch Test  Strp (Glucose Blood) .Marland Kitchen.. 1 Strip Three Times A Day 4)  Proair Hfa 108 (90 Base) Mcg/act Aers (Albuterol Sulfate) .... Two Puffs Q 4 To 6 Hours 5)  Atenolol 50 Mg Tabs (Atenolol) .... One Tab At Bedtime 6)  Hydrochlorothiazide 25 Mg Tabs (Hydrochlorothiazide) .... One Tab Once Daily 7)  Lisinopril 20 Mg Tabs (Lisinopril) .... One Tab Daily 8)   Metformin Hcl 500 Mg Tabs (Metformin Hcl) .... One Tab Qam and Two Tabs Qpm 9)  Novolin R 100 Unit/ml Soln (Insulin Regular Human) .... Inject 15-20 Units Sub Q Twice Daily Before Breakfast and Before Dinner and 5-15 At Bedtime 10)  Simvastatin 40 Mg Tabs (Simvastatin) .... Take One Tab Qpm 11)  Ofloxacin 0.3 % Soln (Ofloxacin) .... One Drop Three Times A Day After A Shot 12)  Fish Oil 1000 Mg Caps (Omega-3 Fatty Acids) .Marland Kitchen.. 1 Capsule Once Daily 13)  Preservision/lutein  Caps (Multiple Vitamins-Minerals) .... 2 Capsules Daily 14)  Avastin 100 Mg/19ml Soln (Bevacizumab) .... Ocular Injection Every 4 Weeks 15)  Metrogel 1 % Gel (Metronidazole) .... Apply Daily To Affected Area of Rosacea. May D/c When Flare Is Under Control. 16)  Amoxicillin-Pot Clavulanate 875-125 Mg Tabs (Amoxicillin-Pot Clavulanate) .Marland Kitchen.. 1 By Mouth Two Times A Day For Possible Parotiditis  Allergies (verified): No Known Drug Allergies  Vital Signs:  Patient profile:   70 year old male Height:      73 inches (185.42 cm) Weight:      322.50 pounds (146.59 kg) BMI:     42.70 O2 Sat:      93 % on Room air Temp:     97.6 degrees F (36.44 degrees C) oral Pulse rate:   72 / minute BP sitting:   118 / 70  (right arm) Cuff size:  regular  Vitals Entered By: Michel Bickers CMA (February 11, 2010 2:34 PM)  O2 Sat at Rest %:  93 O2 Flow:  Room air CC: COPD...OSA.Marland KitchenMarland KitchenPt says his breathing is doing well...discuss cpap auto titrate Comments Medications reviewed with patient Michel Bickers CMA  February 11, 2010 2:42 PM   Physical Exam  General:  pleasant man, obese, comfortable on RA Head:  normocephalic and atraumatic Eyes:  conjunctiva and sclera clear Nose:  erythematous, no drainage Mouth:  no deformity or lesions Neck:  no masses, thyromegaly, or abnormal cervical nodes Lungs:  distant, very soft end-exp wheeze Heart:  regular rate and rhythm, S1, S2 without murmurs, rubs, gallops, or clicks Extremities:  trace ankle  edema Neurologic:  non-focal Psych:  alert and cooperative; normal mood and affect; normal attention span and concentration   Impression & Recommendations:  Problem # 1:  PULMONARY NODULE, 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 Overall I believe the nodule has been stable. The measurements in 10/2008 are 1.6x1.5cm. Would not recommend any more scans at this time unless new symptoms or a change on CXR  Orders: Est. Patient Level IV (04540)  Problem # 2:  COPD (ICD-496) off scheduled meds - as needed ProAir  Problem # 3:  OBSTRUCTIVE SLEEP APNEA (ICD-327.23)  Auto-set study suggests increase from 8 to 14cmH2O  Orders: Est. Patient Level IV (98119) DME Referral (DME)  Patient Instructions: 1)  Your CT scan in 10/2008 is stable compared with 2006; the nodule looks slightly smaller than in 04/2008. I do not believe we need to keep doing repeat scans unless your symptoms or CXR change.  2)  We will increase your CPAP pressure to 14cmH2O using ramp-up feature.  3)  Continue to use ProAir as needed  4)  Follow up with Dr Delton Coombes in 6 months or as needed  5)  Call our office if you have any problems with the new CPAP pressure.

## 2010-04-02 NOTE — Letter (Signed)
Summary: Primary Care Consult Scheduled Letter  [facility]  [address] [place], [place] [address]   Phone: [phone]  Fax: [phone]      03/24/2010 MRN: [id number]  Charles [name] [address] [place], [place]  [address]    Dear [name],      We have scheduled an appointment for you.  At the recommendation of Dr.Norins, we have scheduled you a consult with [facility](Dr. Norins) on 03-31-2010 at 9:00 am .Their address is [address] [place], [place] [address]. The office phone number is [phone] .  If this appointment day and time is not convenient for you, please feel free to call the office of the doctor you are being referred to at the number listed above and reschedule the appointment.     It is important for you to keep your scheduled appointments. We are here to make sure you are given good patient care. If you have questions or you have made changes to your appointment, please notify us at  [phone]        , ask for    Debra          .    Thank you,  Patient Care Coordinator [facility]

## 2010-04-02 NOTE — Letter (Signed)
Summary: Certification for PAP/[facility]  Certification for PAP/[facility]   Imported By: Sherian Rein 03/26/2010 15:11:24  _____________________________________________________________________  External Attachment:    Type:   Image     Comment:   External Document

## 2010-04-02 NOTE — Progress Notes (Signed)
Summary: OV TODAY  Phone Note Call from Patient Call back at Home Phone [phone]   Summary of Call: Pt c/o swelling in a gland behind his right ear everytime he eats.  Initial call taken by: Lamar Sprinkles, CMA,  February 09, 2010 12:35 PM  Follow-up for Phone Call        Checked schedule - pt has office visit today w/MD for eval.  Follow-up by: Lamar Sprinkles, CMA,  February 09, 2010 12:36 PM

## 2010-04-02 NOTE — Progress Notes (Signed)
  Phone Note Other Incoming   Request: Send information Summary of Call: Records received from [facility]. 15 pages forwarded to Dr. Delton Coombes for review.

## 2010-04-02 NOTE — Assessment & Plan Note (Signed)
Summary: AFTER EAT BEHIND EAR SWELLING--TENDER-  AND DIARRHEA STC   Vital Signs:  Patient profile:   70 year old male Height:      73 inches Weight:      319 pounds BMI:     42.24 Temp:     98.5 degrees F oral Pulse rate:   68 / minute Pulse rhythm:   regular Resp:     16 per minute BP sitting:   110 / 64  (left arm) Cuff size:   regular  Vitals Entered By: Lanier Prude, Beverly Gust) (February 09, 2010 4:33 PM) CC: diarrhea X 4 days, Rt side jaw/nack pain and swollen Is Patient Diabetic? Yes Comments pt states he is holding Metformin since Sat 02-07-10   Primary Care Provider:  Jacques Navy MD  CC:  diarrhea X 4 days and Rt side jaw/nack pain and swollen.  History of Present Illness: last Tuesday had a meal that did not agree with him and he has had diearrhea, up to 5 times a day, after that.  Several days later he had another bout of diarrhea after a burger.He read on-line that metformin can cause diarrhea. He stopped several days ago and hasn't had recurrent diarrhea. He blood sugar has remained controlled.    He has also had swelling in the area of the right parotid gland that occurs with meals. He has some tenderness in this area but it is mild. No fevers, chills or sweats.   Current Medications (verified): 1)  Dorzolamide Hcl 2 % Soln (Dorzolamide Hcl) .Marland Kitchen.. 1 Drop Right Eye Once Daily 2)  Novolin N 100 Unit/ml Susp (Insulin Isophane Human) .... Inject 20 Units Subcutaneously Every Morning and 30 Units At Bedtime 3)  Onetouch Test  Strp (Glucose Blood) .Marland Kitchen.. 1 Strip Three Times A Day 4)  Proair Hfa 108 (90 Base) Mcg/act Aers (Albuterol Sulfate) .... Two Puffs Q 4 To 6 Hours 5)  Atenolol 50 Mg Tabs (Atenolol) .... One Tab At Bedtime 6)  Hydrochlorothiazide 25 Mg Tabs (Hydrochlorothiazide) .... One Tab Once Daily 7)  Lisinopril 20 Mg Tabs (Lisinopril) .... One Tab Daily 8)  Metformin Hcl 500 Mg Tabs (Metformin Hcl) .... One Tab Qam and Two Tabs Qpm 9)  Novolin R 100  Unit/ml Soln (Insulin Regular Human) .... Inject 15-20 Units Sub Q Twice Daily Before Breakfast and Before Dinner and 5-15 At Bedtime 10)  Simvastatin 40 Mg Tabs (Simvastatin) .... Take One Tab Qpm 11)  Ofloxacin 0.3 % Soln (Ofloxacin) .... One Drop Three Times A Day After A Shot 12)  Spiriva Handihaler 18 Mcg Caps (Tiotropium Bromide Monohydrate) .... As Needed 13)  Fish Oil 1000 Mg Caps (Omega-3 Fatty Acids) .Marland Kitchen.. 1 Capsule Once Daily 14)  Preservision/lutein  Caps (Multiple Vitamins-Minerals) .... 2 Capsules Daily 15)  Avastin 100 Mg/62ml Soln (Bevacizumab) .... Ocular Injection Every 4 Weeks 16)  Metrogel 1 % Gel (Metronidazole) .... Apply Daily To Affected Area of Rosacea. May D/c When Flare Is Under Control.  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    Physical Exam  General:  overweight white male in no distress Head:  normocephalic and atraumatic.  Right parotid gland slightly enlarged and tender. Eyes:  vision grossly intact, pupils equal, and pupils round.     Mouth:  no buccal lesions Neck:  supple.   Lungs:  normal respiratory effort.   Heart:  normal rate and regular rhythm.   Abdomen:  obese, normal bowel sounds.   Pulses:  2+ radial Neurologic:  alert & oriented X3, cranial nerves II-XII intact, and gait normal.     Impression & Recommendations:  Problem # 1:  SIALOLITHIASIS (ICD-527.5) symptoms are c/w intermittent obstruction of parotid gland duct. No palpable mass.  Plan - CT face to r/o parotid stone           sugar free sour balls  Orders: Radiology Referral (Radiology)  Problem # 2:  DIARRHEA (ICD-787.91) Patient with several bouts of diarrhea. Doubt that this is a delayed (several years) reaction to metformin but more likely an infectious process, most likely viral.  Plan - adequate hydration           restart metformin when the diarrhea has completely stopped.   Complete Medication List: 1)  Dorzolamide Hcl 2 % Soln (Dorzolamide hcl) .Marland Kitchen.. 1 drop right eye once daily 2)  Novolin N 100 Unit/ml Susp (Insulin isophane human) .... Inject 20 units subcutaneously every morning and 30 units at bedtime 3)  Onetouch Test Strp (Glucose blood) .Marland Kitchen.. 1 strip three times a day 4)  Proair Hfa 108 (90 Base) Mcg/act Aers (Albuterol sulfate) .... Two puffs q 4 to 6 hours 5)  Atenolol 50 Mg Tabs (Atenolol) .... One tab at bedtime 6)  Hydrochlorothiazide 25 Mg Tabs (Hydrochlorothiazide) .... One tab once daily 7)  Lisinopril 20 Mg Tabs (Lisinopril) .... One tab daily 8)  Metformin Hcl 500 Mg Tabs (Metformin hcl) .... One tab qam and two tabs qpm 9)  Novolin R 100 Unit/ml Soln (Insulin regular human) .... Inject 15-20 units sub q twice daily before breakfast and before dinner and 5-15 at bedtime 10)  Simvastatin 40 Mg Tabs (Simvastatin) .... Take one tab qpm 11)  Ofloxacin 0.3 % Soln (Ofloxacin) .... One drop three times a day after a shot 12)  Spiriva Handihaler 18 Mcg Caps (Tiotropium bromide monohydrate) .... As needed 13)  Fish Oil 1000  Mg Caps (Omega-3 fatty acids) .Marland Kitchen.. 1 capsule once daily 14)  Preservision/lutein Caps (Multiple vitamins-minerals) .... 2 capsules daily 15)  Avastin 100 Mg/60ml Soln (Bevacizumab) .... Ocular injection every 4 weeks 16)  Metrogel 1 % Gel (Metronidazole) .... Apply daily to affected area of rosacea. may d/c when flare is under control. 17)  Amoxicillin-pot Clavulanate 875-125 Mg Tabs (Amoxicillin-pot clavulanate) .Marland Kitchen.. 1 by mouth two times a day for possible parotiditis Prescriptions: AMOXICILLIN-POT CLAVULANATE 875-125 MG TABS (AMOXICILLIN-POT CLAVULANATE) 1 by mouth two times a day for possible parotiditis  #10 x 0   Entered and Authorized by:   Jacques Navy MD   Signed by:   Jacques Navy MD on 02/09/2010   Method used:   Electronically to        [facility] (retail)       [address]       [place], [place]  [id number]  Ph: [phone]       Fax: [phone]   RxID:   [id number]    Orders Added: 1)  Radiology Referral [Radiology] 2)  Est. Patient Level IV [84132]

## 2010-04-16 NOTE — Consult Note (Signed)
Summary: Serena Colonel MD/[facility]  Serena Colonel MD/[facility]   Imported By: Lester [place] 04/09/2010 09:13:30  _____________________________________________________________________  External Attachment:    Type:   Image     Comment:   External Document

## 2010-04-16 NOTE — Letter (Signed)
Summary: Dorthula Rue MD/[facility]  Dorthula Rue MD/[facility]   Imported By: Lester [place] 04/06/2010 09:32:17  _____________________________________________________________________  External Attachment:    Type:   Image     Comment:   External Document

## 2010-04-22 NOTE — Miscellaneous (Addendum)
Summary: Orders Update  Clinical Lists Changes  Problems: Added new problem of PAROTITIS (ICD-527.2) - Signed Orders: Added new Referral order of ENT Referral (ENT) - Signed

## 2010-08-04 ENCOUNTER — Telehealth: Payer: Self-pay | Admitting: *Deleted

## 2010-08-04 MED ORDER — METFORMIN HCL 500 MG PO TABS: 500.0000 mg | ORAL_TABLET | Freq: Every day | ORAL | Status: DC

## 2010-08-04 NOTE — Telephone Encounter (Signed)
refill 

## 2010-08-24 ENCOUNTER — Encounter: Payer: Self-pay | Admitting: Emergency Medicine

## 2010-08-25 ENCOUNTER — Ambulatory Visit: Payer: Self-pay | Admitting: Emergency Medicine

## 2010-08-25 ENCOUNTER — Ambulatory Visit (INDEPENDENT_AMBULATORY_CARE_PROVIDER_SITE_OTHER): Payer: Medicare Other | Admitting: Emergency Medicine

## 2010-08-25 ENCOUNTER — Encounter: Payer: Self-pay | Admitting: Emergency Medicine

## 2010-08-25 DIAGNOSIS — J449 Chronic obstructive pulmonary disease, unspecified: Secondary | ICD-10-CM

## 2010-08-25 DIAGNOSIS — G4733 Obstructive sleep apnea (adult) (pediatric): Secondary | ICD-10-CM

## 2010-08-25 DIAGNOSIS — J984 Other disorders of lung: Secondary | ICD-10-CM

## 2010-08-25 NOTE — Assessment & Plan Note (Addendum)
Trial Symbicort bid, samples given, he will call us if he wants [place] to order at pharmacy Continue SABA prn.

## 2010-08-25 NOTE — Assessment & Plan Note (Signed)
Stable over > 5 yrs by CT scans.

## 2010-08-25 NOTE — Assessment & Plan Note (Signed)
CPAP 14 qhs

## 2010-08-25 NOTE — Progress Notes (Signed)
70 yo former smoker, hx obesity, DM, HTN, OSA on CPAP 8. Also hx gallstone pancreatitis s/p chole in the past. Dx with COPD by pulmonologist in [place] many yrs ago. Has been on combivent, more recently Spiriva + ProAir. He doesn't take the Spiriva daily, doesn't see that it helps him. he does use ProAir, feels it helps him clear sputum. Also was followed for pulm nodule, he reports was stable on serial CTs over 5 yrs.  Able to walk around the house, but has to stop to rest if he walks over a block, climbs stairs. His last PFTs were done 6 -7 mo ago in [place]. Good compliance w CPAP, pressure never rechecked since '97.   ROV 02/11/10 -- follows up today for OSA/OHS, COPD. Last time we stopped Spiriva because he was only using as needed. He occas uses ProAir (about qod), helps him clear phlegm. Auto-titration CPAP study performed and shows that optimal CPAP is 14cmH2O (currently set on 8). CT scans available for review dating back to 2005 - There has been very little change up to last one done in 11/01/08: In fact it looks smaller to me (1.6x1.5cm from 1.8x1.5).   ROV 08/25/10 -- OSA/OHS, COPD. He is tolerating CPAP 14cmH2O. He is doing well, using ProAir prn - uses it when he is congested, very rare. He gets SOB with exertion, moves slowly,able to do projects around the house, work in the yard. He is happy w the breathing meds right now.   Gen: Pleasant, obese man, in no distress,  normal affect  ENT: No lesions,  mouth clear,  oropharynx clear, no postnasal drip  Neck: No JVD, no TMG, no carotid bruits  Lungs: No use of accessory muscles, no dullness to percussion, clear without rales or rhonchi  Cardiovascular: RRR, heart sounds normal, no murmur or gallops, no peripheral edema  Abdomen: obese, non-tender  Musculoskeletal: No deformities, no cyanosis or clubbing  Neuro: alert, non focal  Skin: Warm, no lesions or rashes   COPD Trial Symbicort bid, samples given, he will call us if he wants [place] to  order at pharmacy Continue SABA prn.   OBSTRUCTIVE SLEEP APNEA CPAP 14 qhs  PULMONARY NODULE, SOLITARY Stable over > 5 yrs by CT scans.

## 2010-08-25 NOTE — Patient Instructions (Signed)
We will try Symbicort 2 puffs twice a day If you benefit on the samples then we we will order this medication at your pharmacy Continue your CPAP every night Follow up with Dr Delton Coombes in 6 months or as needed.

## 2010-10-08 ENCOUNTER — Telehealth: Payer: Self-pay | Admitting: Emergency Medicine

## 2010-10-08 NOTE — Telephone Encounter (Signed)
All records faxed to [facility]/Jamie @ [phone]  10/08/10/km

## 2010-10-19 ENCOUNTER — Other Ambulatory Visit: Payer: Self-pay | Admitting: *Deleted

## 2010-10-19 MED ORDER — METFORMIN HCL 500 MG PO TABS: 500.0000 mg | ORAL_TABLET | Freq: Four times a day (QID) | ORAL | Status: DC

## 2010-10-22 ENCOUNTER — Other Ambulatory Visit: Payer: Self-pay | Admitting: Internal Medicine

## 2010-12-30 ENCOUNTER — Ambulatory Visit (INDEPENDENT_AMBULATORY_CARE_PROVIDER_SITE_OTHER): Payer: Medicare Other

## 2010-12-30 DIAGNOSIS — Z23 Encounter for immunization: Secondary | ICD-10-CM

## 2011-01-08 ENCOUNTER — Other Ambulatory Visit: Payer: Self-pay | Admitting: Internal Medicine

## 2011-01-29 ENCOUNTER — Other Ambulatory Visit: Payer: Self-pay | Admitting: Internal Medicine

## 2011-03-04 ENCOUNTER — Ambulatory Visit: Payer: Medicare Other | Admitting: Emergency Medicine

## 2011-03-25 ENCOUNTER — Encounter: Payer: Self-pay | Admitting: Emergency Medicine

## 2011-03-25 ENCOUNTER — Ambulatory Visit (INDEPENDENT_AMBULATORY_CARE_PROVIDER_SITE_OTHER): Payer: Medicare Other | Admitting: Emergency Medicine

## 2011-03-25 DIAGNOSIS — J984 Other disorders of lung: Secondary | ICD-10-CM

## 2011-03-25 DIAGNOSIS — G4733 Obstructive sleep apnea (adult) (pediatric): Secondary | ICD-10-CM

## 2011-03-25 DIAGNOSIS — B079 Viral wart, unspecified: Secondary | ICD-10-CM | POA: Insufficient documentation

## 2011-03-25 DIAGNOSIS — J449 Chronic obstructive pulmonary disease, unspecified: Secondary | ICD-10-CM

## 2011-03-25 NOTE — Assessment & Plan Note (Signed)
Stable by CT scan, no further scans planned

## 2011-03-25 NOTE — Patient Instructions (Signed)
Please start Symbicort 160/4.56mcg, 2 puffs twice a day for the next month.  Keep track of how your breathing is doing so we can discuss next time Use your ProAir as needed Wear your CPAP every night Follow with Dr Delton Coombes in 4 -6 weeks

## 2011-03-25 NOTE — Assessment & Plan Note (Signed)
He wants to be referred to see about having this removed

## 2011-03-25 NOTE — Assessment & Plan Note (Signed)
CPAP 14

## 2011-03-25 NOTE — Progress Notes (Signed)
71 yo former smoker, hx obesity, DM, HTN, OSA on CPAP 8. Also hx gallstone pancreatitis s/p chole in the past. Dx with COPD by pulmonologist in [place] many yrs ago. Has been on combivent, more recently Spiriva + ProAir. He doesn't take the Spiriva daily, doesn't see that it helps him. he does use ProAir, feels it helps him clear sputum. Also was followed for pulm nodule, he reports was stable on serial CTs over 5 yrs.  Able to walk around the house, but has to stop to rest if he walks over a block, climbs stairs. His last PFTs were done 6 -7 mo ago in [place]. Good compliance w CPAP, pressure never rechecked since '97.   ROV 02/11/10 -- follows up today for OSA/OHS, COPD. Last time we stopped Spiriva because he was only using as needed. He occas uses ProAir (about qod), helps him clear phlegm. Auto-titration CPAP study performed and shows that optimal CPAP is 14cmH2O (currently set on 8). CT scans available for review dating back to 2005 - There has been very little change up to last one done in 11/01/08: In fact it looks smaller to me (1.6x1.5cm from 1.8x1.5).   ROV 08/25/10 -- OSA/OHS, COPD. He is tolerating CPAP 14cmH2O. He is doing well, using ProAir prn - uses it when he is congested, very rare. He gets SOB with exertion, moves slowly,able to do projects around the house, work in the yard. He is happy w the breathing meds right now.   ROV 03/25/11 --  OSA/OHS, COPD. Regular f/u. We tried Symbicort last time to see if he would benefit. He took it for a week and then stopped. He didn't notice much difference. He uses ProAir but rarely. He is still having chest congestion, daily phlegm.   Gen: Pleasant, obese man, in no distress,  normal affect  ENT: No lesions,  mouth clear,  oropharynx clear, no postnasal drip  Neck: No JVD, no TMG, no carotid bruits  Lungs: No use of accessory muscles, no dullness to percussion, clear without rales or rhonchi  Cardiovascular: RRR, heart sounds normal, no murmur or gallops,  no peripheral edema  Abdomen: obese, non-tender  Musculoskeletal: No deformities, no cyanosis or clubbing  Neuro: alert, non focal  Skin: Warm, no lesions or rashes   OBSTRUCTIVE SLEEP APNEA CPAP 14  PULMONARY NODULE, SOLITARY Stable by CT scan, no further scans planned  COPD Has not noticed improvement w Spiriva or Symbicort (although only tried this one for a week). He does use some ProAir. Still has exertional SOB. One aspect of this is his deconditioning. Discussed today possibly retrying long-acting meds.  - retry Symbicort, this time for longer to see if he benefit.  - prn SABA

## 2011-03-25 NOTE — Assessment & Plan Note (Signed)
Has not noticed improvement w Spiriva or Symbicort (although only tried this one for a week). He does use some ProAir. Still has exertional SOB. One aspect of this is his deconditioning. Discussed today possibly retrying long-acting meds.  - retry Symbicort, this time for longer to see if he benefit.  - prn SABA

## 2011-05-01 ENCOUNTER — Other Ambulatory Visit: Payer: Self-pay | Admitting: Internal Medicine

## 2011-05-03 ENCOUNTER — Encounter: Payer: Self-pay | Admitting: Emergency Medicine

## 2011-05-03 ENCOUNTER — Ambulatory Visit (INDEPENDENT_AMBULATORY_CARE_PROVIDER_SITE_OTHER): Payer: Medicare Other | Admitting: Emergency Medicine

## 2011-05-03 DIAGNOSIS — R0609 Other forms of dyspnea: Secondary | ICD-10-CM

## 2011-05-03 DIAGNOSIS — J4489 Other specified chronic obstructive pulmonary disease: Secondary | ICD-10-CM

## 2011-05-03 DIAGNOSIS — J984 Other disorders of lung: Secondary | ICD-10-CM

## 2011-05-03 DIAGNOSIS — J449 Chronic obstructive pulmonary disease, unspecified: Secondary | ICD-10-CM

## 2011-05-03 DIAGNOSIS — G4733 Obstructive sleep apnea (adult) (pediatric): Secondary | ICD-10-CM

## 2011-05-03 MED ORDER — BUDESONIDE-FORMOTEROL FUMARATE 160-4.5 MCG/ACT IN AERO: 2.0000 | INHALATION_SPRAY | Freq: Two times a day (BID) | RESPIRATORY_TRACT | Status: DC

## 2011-05-03 NOTE — Progress Notes (Signed)
71 yo former smoker, hx obesity, DM, HTN, OSA on CPAP 8. Also hx gallstone pancreatitis s/p chole in the past. Dx with COPD by pulmonologist in [place] many yrs ago. Has been on combivent, more recently Spiriva + ProAir. He doesn't take the Spiriva daily, doesn't see that it helps him. he does use ProAir, feels it helps him clear sputum. Also was followed for pulm nodule, he reports was stable on serial CTs over 5 yrs.  Able to walk around the house, but has to stop to rest if he walks over a block, climbs stairs. His last PFTs were done 6 -7 mo ago in [place]. Good compliance w CPAP, pressure never rechecked since '97.   ROV 02/11/10 -- follows up today for OSA/OHS, COPD. Last time we stopped Spiriva because he was only using as needed. He occas uses ProAir (about qod), helps him clear phlegm. Auto-titration CPAP study performed and shows that optimal CPAP is 14cmH2O (currently set on 8). CT scans available for review dating back to 2005 - There has been very little change up to last one done in 11/01/08: In fact it looks smaller to me (1.6x1.5cm from 1.8x1.5).   ROV 08/25/10 -- OSA/OHS, COPD. He is tolerating CPAP 14cmH2O. He is doing well, using ProAir prn - uses it when he is congested, very rare. He gets SOB with exertion, moves slowly,able to do projects around the house, work in the yard. He is happy w the breathing meds right now.   ROV 03/25/11 --  OSA/OHS, COPD. Regular f/u. We tried Symbicort last time to see if he would benefit. He took it for a week and then stopped. He didn't notice much difference. He uses ProAir but rarely. He is still having chest congestion, daily phlegm.   ROV 05/03/11 --  OSA/OHS, COPD. Has not benefited in past from long-acting meds, but last time we restarted Symbicort to see if it would help. He believes that his cough and chest congestion are both better. Still with significant exertional SOB. Good compliance w CPAP.     Gen: Pleasant, obese man, in no distress,  normal  affect  ENT: No lesions,  mouth clear,  oropharynx clear, no postnasal drip  Neck: No JVD, no TMG, no carotid bruits  Lungs: No use of accessory muscles, no dullness to percussion, clear without rales or rhonchi  Cardiovascular: RRR, heart sounds normal, no murmur or gallops, no peripheral edema  Abdomen: obese, non-tender  Musculoskeletal: No deformities, no cyanosis or clubbing  Neuro: alert, non focal  Skin: Warm, no lesions or rashes   PULMONARY NODULE, SOLITARY Stable by serial CT scans  OBSTRUCTIVE SLEEP APNEA Continue current CPAP   DOE (dyspnea on exertion) Walking oximetry today  Check screening TTE to r/o PAH rov to review the results  COPD Continue symbicort bid SABA prn.

## 2011-05-03 NOTE — Assessment & Plan Note (Signed)
Continue symbicort bid SABA prn.

## 2011-05-03 NOTE — Patient Instructions (Signed)
Please continue your CPAP every night We will continue Symbicort twice a day Use your albuterol inhaler 2 puffs  We will check a walking oxygen test today We will check an echocardiogram and review the results at your next visit Follow with Dr Delton Coombes in 6 weeks to review your testing.

## 2011-05-03 NOTE — Assessment & Plan Note (Signed)
Continue current CPAP 

## 2011-05-03 NOTE — Assessment & Plan Note (Signed)
Walking oximetry today  Check screening TTE to r/o PAH rov to review the results

## 2011-05-03 NOTE — Assessment & Plan Note (Signed)
Stable by serial CT scans 

## 2011-05-12 ENCOUNTER — Other Ambulatory Visit (HOSPITAL_COMMUNITY): Payer: Self-pay | Admitting: Radiology

## 2011-05-12 DIAGNOSIS — R0600 Dyspnea, unspecified: Secondary | ICD-10-CM

## 2011-05-13 ENCOUNTER — Ambulatory Visit (HOSPITAL_COMMUNITY): Payer: Medicare Other | Attending: Cardiology

## 2011-05-13 ENCOUNTER — Other Ambulatory Visit: Payer: Self-pay

## 2011-05-13 DIAGNOSIS — R0989 Other specified symptoms and signs involving the circulatory and respiratory systems: Secondary | ICD-10-CM | POA: Insufficient documentation

## 2011-05-13 DIAGNOSIS — I379 Nonrheumatic pulmonary valve disorder, unspecified: Secondary | ICD-10-CM | POA: Insufficient documentation

## 2011-05-13 DIAGNOSIS — R0600 Dyspnea, unspecified: Secondary | ICD-10-CM

## 2011-05-13 DIAGNOSIS — E119 Type 2 diabetes mellitus without complications: Secondary | ICD-10-CM | POA: Insufficient documentation

## 2011-05-13 DIAGNOSIS — R0609 Other forms of dyspnea: Secondary | ICD-10-CM | POA: Insufficient documentation

## 2011-05-13 DIAGNOSIS — E669 Obesity, unspecified: Secondary | ICD-10-CM | POA: Insufficient documentation

## 2011-05-13 DIAGNOSIS — I359 Nonrheumatic aortic valve disorder, unspecified: Secondary | ICD-10-CM | POA: Insufficient documentation

## 2011-05-13 DIAGNOSIS — I10 Essential (primary) hypertension: Secondary | ICD-10-CM | POA: Insufficient documentation

## 2011-06-16 ENCOUNTER — Other Ambulatory Visit: Payer: Self-pay | Admitting: Internal Medicine

## 2011-06-21 ENCOUNTER — Encounter: Payer: Self-pay | Admitting: Emergency Medicine

## 2011-06-21 ENCOUNTER — Ambulatory Visit (INDEPENDENT_AMBULATORY_CARE_PROVIDER_SITE_OTHER): Payer: Medicare Other | Admitting: Emergency Medicine

## 2011-06-21 VITALS — BP 124/74 | HR 51 | Temp 97.8°F | Ht 73.0 in | Wt 331.6 lb

## 2011-06-21 DIAGNOSIS — J4489 Other specified chronic obstructive pulmonary disease: Secondary | ICD-10-CM

## 2011-06-21 DIAGNOSIS — J449 Chronic obstructive pulmonary disease, unspecified: Secondary | ICD-10-CM

## 2011-06-21 DIAGNOSIS — R0609 Other forms of dyspnea: Secondary | ICD-10-CM

## 2011-06-21 DIAGNOSIS — J984 Other disorders of lung: Secondary | ICD-10-CM

## 2011-06-21 DIAGNOSIS — G4733 Obstructive sleep apnea (adult) (pediatric): Secondary | ICD-10-CM

## 2011-06-21 MED ORDER — ALBUTEROL SULFATE HFA 108 (90 BASE) MCG/ACT IN AERS: 2.0000 | INHALATION_SPRAY | Freq: Four times a day (QID) | RESPIRATORY_TRACT | Status: DC | PRN

## 2011-06-21 NOTE — Assessment & Plan Note (Signed)
Continue the Spiriva, prn SABA

## 2011-06-21 NOTE — Assessment & Plan Note (Signed)
I believe that obesity and deconditioning are a big part of this. No indication of PAH on TTE. I believe that obesity and deconditioning are largest contributor right now.

## 2011-06-21 NOTE — Progress Notes (Signed)
71 yo former smoker, hx obesity, DM, HTN, OSA on CPAP 8. Also hx gallstone pancreatitis s/p chole in the past. Dx with COPD by pulmonologist in [place] many yrs ago. Has been on combivent, more recently Spiriva + ProAir. He doesn't take the Spiriva daily, doesn't see that it helps him. he does use ProAir, feels it helps him clear sputum. Also was followed for pulm nodule, he reports was stable on serial CTs over 5 yrs.  Able to walk around the house, but has to stop to rest if he walks over a block, climbs stairs. His last PFTs were done 6 -7 mo ago in [place]. Good compliance w CPAP, pressure never rechecked since '97.   ROV 02/11/10 -- follows up today for OSA/OHS, COPD. Last time we stopped Spiriva because he was only using as needed. He occas uses ProAir (about qod), helps him clear phlegm. Auto-titration CPAP study performed and shows that optimal CPAP is 14cmH2O (currently set on 8). CT scans available for review dating back to 2005 - There has been very little change up to last one done in 11/01/08: In fact it looks smaller to me (1.6x1.5cm from 1.8x1.5).   ROV 08/25/10 -- OSA/OHS, COPD. He is tolerating CPAP 14cmH2O. He is doing well, using ProAir prn - uses it when he is congested, very rare. He gets SOB with exertion, moves slowly,able to do projects around the house, work in the yard. He is happy w the breathing meds right now.   ROV 03/25/11 --  OSA/OHS, COPD. Regular f/u. We tried Symbicort last time to see if he would benefit. He took it for a week and then stopped. He didn't notice much difference. He uses ProAir but rarely. He is still having chest congestion, daily phlegm.   ROV 05/03/11 --  OSA/OHS, COPD. Has not benefited in past from long-acting meds, but last time we restarted Symbicort to see if it would help. He believes that his cough and chest congestion are both better. Still with significant exertional SOB. Good compliance w CPAP.   ROV 06/21/11 -- OSA/OHS, COPD. He had TTE since last time to  screen for PAH > shows some LAE, normal RV size and fxn (05/13/11). He remains on CPAP. He is using symbicort, probably some improvement.    Gen: Pleasant, obese man, in no distress,  normal affect  ENT: No lesions,  mouth clear,  oropharynx clear, no postnasal drip  Neck: No JVD, no TMG, no carotid bruits  Lungs: No use of accessory muscles, no dullness to percussion, clear without rales or rhonchi  Cardiovascular: RRR, heart sounds normal, no murmur or gallops, no peripheral edema  Abdomen: obese, non-tender  Musculoskeletal: No deformities, no cyanosis or clubbing  Neuro: alert, non focal  Skin: Warm, no lesions or rashes   PULMONARY NODULE, SOLITARY Stable by serial CT's  OBSTRUCTIVE SLEEP APNEA Stable on CPAP 14  OBESITY, MORBID Discussed exercise today, possible cardiopulm rehab vs an overall exercise regimen  DOE (dyspnea on exertion) I believe that obesity and deconditioning are a big part of this. No indication of PAH on TTE. I believe that obesity and deconditioning are largest contributor right now.   COPD Continue the Spiriva, prn SABA

## 2011-06-21 NOTE — Assessment & Plan Note (Signed)
Discussed exercise today, possible cardiopulm rehab vs an overall exercise regimen

## 2011-06-21 NOTE — Assessment & Plan Note (Signed)
Stable on CPAP 14

## 2011-06-21 NOTE — Assessment & Plan Note (Signed)
Stable by serial CTs. 

## 2011-06-21 NOTE — Patient Instructions (Signed)
Please continue your Spiriva We will refer you to Pulmonary Rehab to initiate an exercise program Continue your CPAP Follow with Dr Delton Coombes in 4 months

## 2011-08-11 ENCOUNTER — Encounter (HOSPITAL_COMMUNITY)
Admission: RE | Admit: 2011-08-11 | Discharge: 2011-08-11 | Disposition: A | Discharge status: Home / Self Care | Payer: Medicare Other | Source: Ambulatory Visit | Attending: Emergency Medicine | Admitting: Emergency Medicine

## 2011-08-11 ENCOUNTER — Telehealth: Payer: Self-pay | Admitting: Emergency Medicine

## 2011-08-11 ENCOUNTER — Encounter (HOSPITAL_COMMUNITY): Payer: Self-pay

## 2011-08-11 ENCOUNTER — Ambulatory Visit (HOSPITAL_COMMUNITY)
Admission: RE | Admit: 2011-08-11 | Discharge: 2011-08-11 | Disposition: A | Discharge status: Home / Self Care | Payer: Medicare Other | Source: Ambulatory Visit | Attending: Internal Medicine | Admitting: Internal Medicine

## 2011-08-11 DIAGNOSIS — I4891 Unspecified atrial fibrillation: Secondary | ICD-10-CM

## 2011-08-11 DIAGNOSIS — R9431 Abnormal electrocardiogram [ECG] [EKG]: Secondary | ICD-10-CM | POA: Insufficient documentation

## 2011-08-11 HISTORY — DX: Unspecified asthma, uncomplicated: J45.909

## 2011-08-11 HISTORY — DX: Essential (primary) hypertension: I10

## 2011-08-11 HISTORY — DX: Cerebral infarction, unspecified: I63.9

## 2011-08-11 NOTE — Telephone Encounter (Signed)
Called by Jamesetta So at [facility] and notified that pt is in A Fib, HR in the 50-60's. He is hemodynamically stable, feels fine, tells them that he isn't sure whether he has ever had A Fib before (no hx A Fib in my records). He has had other cardiac eval before - echo, cath, etc. He has not established with cardiology in [place]. We agreed to defer starting his exercise program until he can be seen by cardiology. I will refer him now.   Current Outpatient Prescriptions on File Prior to Visit  Medication Sig Dispense Refill  . Aflibercept (EYLEA) 2 MG/0.05ML SOLN Injection every 12 weeks      . albuterol (PROAIR HFA) 108 (90 BASE) MCG/ACT inhaler Inhale 2 puffs into the lungs every 6 (six) hours as needed.  18 g  11  . atenolol (TENORMIN) 50 MG tablet TAKE ONE TABLET AT BEDTIME.  90 tablet  3  . B-D INS SYRINGE 0.5CC/30GX1/2" 30G X 1/2" 0.5 ML MISC USE AS DIRECTED.  200 each  PRN  . budesonide-formoterol (SYMBICORT) 160-4.5 MCG/ACT inhaler Inhale 2 puffs into the lungs 2 (two) times daily.  1 Inhaler  12  . doxycycline (VIBRAMYCIN) 100 MG capsule Take 100 mg by mouth 1 day or 1 dose. As needed for rosacea      . hydrochlorothiazide (HYDRODIURIL) 25 MG tablet TAKE 1 TABLET ONCE DAILY.  90 tablet  3  . lisinopril (PRINIVIL,ZESTRIL) 20 MG tablet TAKE 1 TABLET ONCE DAILY.  90 tablet  3  . metFORMIN (GLUCOPHAGE) 500 MG tablet TAKE 1 TABLET IN THE MORNING AND 2 TABLETS IN THE EVENING.  270 tablet  3  . NOVOLIN N 100 UNIT/ML injection 15 units every morning and 20 to 30 units every evening      . NOVOLIN R 100 UNIT/ML injection INJECT 15-20 UNITS TWICE DAILY BEFORE BREAKFAST AND DINNER,AND 5-15 UNTIS AT BEDTIME.  30 mL  0  . Omega-3 Fatty Acids (FISH OIL) 1000 MG CAPS Take 1 capsule by mouth daily.        . ONE TOUCH ULTRA TEST test strip CHECK BLOOD SUGAR 3 TIMES A DAY.  100 each  11  . simvastatin (ZOCOR) 40 MG tablet TAKE 1 TABLET IN THE P.M.  90 tablet  3  . Sulfacetamide Sodium 10 % SUSP Apply  topically daily as directed       Levy Pupa, MD, PhD 08/11/2011, 4:50 PM [facility] [phone] or if no answer [phone]

## 2011-08-11 NOTE — Progress Notes (Signed)
Mr. Phegley came to Pulmonary Rehab today for orientation.  Health history reviewed, medications reviewed and nurse assessment done. Vital signs were taken and patient was being prepared for 6 min walk test.  He was placed on Zoll monitor and rhythm strip showed atrial fibrillation.  Mr Suastegui states he has had an irregular heart beat for several years and his heart rate is also slow.  He is not currently on an anticoagulant. He took baby aspirin several years ago and discontinued after he had some problem with possible bleeding.  Unable to find an EKG for comparison, a 12 lead EKG was requested and done. This patient also states he has had 3 heart catheterizations in the [place] area and was told he did has some small blockages.  Dr Delton Coombes was notified of our finding and EKG was faxed to him at office.  Patient was reassured, given full explanation of the situation and advised if any problems to call 911.  Signs and symptoms were reviewed with patient and he voiced understanding.  He was informed that Dr. Delton Coombes is requesting a cardiology consult for evaluation of atrial fib. Since the patient had informed us he had a previous stroke, we did his grip strength test on the right and left.  Right 52  And left 33.   We will delay walk test and start of exercise in Pulmonary Rehab until cardiac work up is done and patient is cleared to exercise.

## 2011-08-12 ENCOUNTER — Encounter (HOSPITAL_COMMUNITY): Payer: Medicare Other

## 2011-08-12 NOTE — Telephone Encounter (Signed)
Thanks

## 2011-08-12 NOTE — Telephone Encounter (Signed)
Appointment scheduled with Dr. Myrtis Ser for Monday 08/16/11 at 4:00 at [facility] [address] 3rd floor. Pt was called and given appointment with directions to the clinic. Pt voiced understanding and was advised to call us if he had any questions or concerns. Rhonda J Cobb

## 2011-08-13 ENCOUNTER — Encounter: Payer: Self-pay | Admitting: Cardiology

## 2011-08-13 DIAGNOSIS — R9430 Abnormal result of cardiovascular function study, unspecified: Secondary | ICD-10-CM | POA: Insufficient documentation

## 2011-08-13 DIAGNOSIS — I350 Nonrheumatic aortic (valve) stenosis: Secondary | ICD-10-CM | POA: Insufficient documentation

## 2011-08-13 DIAGNOSIS — IMO0002 Reserved for concepts with insufficient information to code with codable children: Secondary | ICD-10-CM | POA: Insufficient documentation

## 2011-08-16 ENCOUNTER — Encounter: Payer: Self-pay | Admitting: Cardiology

## 2011-08-16 ENCOUNTER — Ambulatory Visit (INDEPENDENT_AMBULATORY_CARE_PROVIDER_SITE_OTHER): Payer: Medicare Other | Admitting: Cardiology

## 2011-08-16 VITALS — BP 138/80 | HR 78 | Resp 18 | Ht 74.0 in | Wt 319.1 lb

## 2011-08-16 DIAGNOSIS — I6350 Cerebral infarction due to unspecified occlusion or stenosis of unspecified cerebral artery: Secondary | ICD-10-CM

## 2011-08-16 DIAGNOSIS — I359 Nonrheumatic aortic valve disorder, unspecified: Secondary | ICD-10-CM

## 2011-08-16 DIAGNOSIS — G4733 Obstructive sleep apnea (adult) (pediatric): Secondary | ICD-10-CM

## 2011-08-16 DIAGNOSIS — R0609 Other forms of dyspnea: Secondary | ICD-10-CM

## 2011-08-16 DIAGNOSIS — E785 Hyperlipidemia, unspecified: Secondary | ICD-10-CM

## 2011-08-16 DIAGNOSIS — I10 Essential (primary) hypertension: Secondary | ICD-10-CM

## 2011-08-16 DIAGNOSIS — K859 Acute pancreatitis without necrosis or infection, unspecified: Secondary | ICD-10-CM

## 2011-08-16 DIAGNOSIS — R609 Edema, unspecified: Secondary | ICD-10-CM | POA: Insufficient documentation

## 2011-08-16 DIAGNOSIS — D649 Anemia, unspecified: Secondary | ICD-10-CM

## 2011-08-16 DIAGNOSIS — J449 Chronic obstructive pulmonary disease, unspecified: Secondary | ICD-10-CM

## 2011-08-16 DIAGNOSIS — K5730 Diverticulosis of large intestine without perforation or abscess without bleeding: Secondary | ICD-10-CM | POA: Insufficient documentation

## 2011-08-16 DIAGNOSIS — I4891 Unspecified atrial fibrillation: Secondary | ICD-10-CM | POA: Insufficient documentation

## 2011-08-16 DIAGNOSIS — R911 Solitary pulmonary nodule: Secondary | ICD-10-CM

## 2011-08-16 DIAGNOSIS — I639 Cerebral infarction, unspecified: Secondary | ICD-10-CM

## 2011-08-16 DIAGNOSIS — I350 Nonrheumatic aortic (valve) stenosis: Secondary | ICD-10-CM

## 2011-08-16 DIAGNOSIS — I2510 Atherosclerotic heart disease of native coronary artery without angina pectoris: Secondary | ICD-10-CM

## 2011-08-16 MED ORDER — ATENOLOL 50 MG PO TABS: 25.0000 mg | ORAL_TABLET | Freq: Every day | ORAL | Status: DC

## 2011-08-16 NOTE — Assessment & Plan Note (Signed)
By history there may be mild aortic stenosis. I did not hear a significant murmur today.

## 2011-08-16 NOTE — Assessment & Plan Note (Signed)
I believe that his venous insufficiency explains his edema. The patient himself is not even aware that he has edema.

## 2011-08-16 NOTE — Progress Notes (Signed)
HPI   The patient is seen as a new cardiac evaluation. He was noted recently in one of the rehabilitation programs that his monitor showed atrial fibrillation. According to the patient this had not been seen in the past. He was referred to me for further evaluation. The patient has a very extensive history. I reviewed it in the electronic medical record. I also discussed with him. He gave me a very long history about his problems in the past in the [place] area. The patient underwent cardiac catheterization on several occasions in the past according to him. He tells me he had no major coronary disease. His last cath was done at least 15 years ago. He has never been told of any significant arrhythmia. It sounds like he may have had some PACs or PVCs. He did have a stroke many years ago. He was not told of atrial fibrillation at that time. He was not put on any type of anticoagulation.   The patient has significant lung disease and is being treated for this.   The patient had a 2-D echo in March, 2013 for general assessment. He had good left ventricular function at that time.He also had good right ventricular function.  I have spent greater than 90 minutes reviewing old records, talking with the patient, calling his primary care doctor, and trying to figure out how to proceed in this case. No Known Allergies  Current Outpatient Prescriptions  Medication Sig Dispense Refill  . Aflibercept (EYLEA) 2 MG/0.05ML SOLN Injection every 12 weeks      . albuterol (PROAIR HFA) 108 (90 BASE) MCG/ACT inhaler Inhale 2 puffs into the lungs every 6 (six) hours as needed.  18 g  11  . atenolol (TENORMIN) 50 MG tablet Take 0.5 tablets (25 mg total) by mouth daily.  90 tablet  3  . B-D INS SYRINGE 0.5CC/30GX1/2" 30G X 1/2" 0.5 ML MISC USE AS DIRECTED.  200 each  PRN  . budesonide-formoterol (SYMBICORT) 160-4.5 MCG/ACT inhaler Inhale 2 puffs into the lungs 2 (two) times daily.  1 Inhaler  12  . doxycycline  (VIBRAMYCIN) 100 MG capsule Take 100 mg by mouth 1 day or 1 dose. As needed for rosacea      . hydrochlorothiazide (HYDRODIURIL) 25 MG tablet TAKE 1 TABLET ONCE DAILY.  90 tablet  3  . lisinopril (PRINIVIL,ZESTRIL) 20 MG tablet TAKE 1 TABLET ONCE DAILY.  90 tablet  3  . metFORMIN (GLUCOPHAGE) 500 MG tablet TAKE 1 TABLET IN THE MORNING AND 2 TABLETS IN THE EVENING.  270 tablet  3  . NOVOLIN N 100 UNIT/ML injection 15 units every morning and 20 to 30 units every evening      . NOVOLIN R 100 UNIT/ML injection INJECT 15-20 UNITS TWICE DAILY BEFORE BREAKFAST AND DINNER,AND 5-15 UNTIS AT BEDTIME.  30 mL  0  . Omega-3 Fatty Acids (FISH OIL) 1000 MG CAPS Take 1 capsule by mouth daily.        . ONE TOUCH ULTRA TEST test strip CHECK BLOOD SUGAR 3 TIMES A DAY.  100 each  11  . simvastatin (ZOCOR) 40 MG tablet TAKE 1 TABLET IN THE P.M.  90 tablet  3  . Sulfacetamide Sodium 10 % SUSP Apply topically daily as directed      . DISCONTD: atenolol (TENORMIN) 50 MG tablet TAKE ONE TABLET AT BEDTIME.  90 tablet  3    History   Social History  . Marital Status: Single    Spouse Name:  N/A    Number of Children: N/A  . Years of Education: N/A   Occupational History  . retired     Camera operator. professional musician for 12 years.    Social History Main Topics  . Smoking status: Former Smoker -- 3.0 packs/day for 35 years    Quit date: 03/01/1990  . Smokeless tobacco: Not on file  . Alcohol Use: Yes  . Drug Use: No  . Sexually Active: Not on file   Other Topics Concern  . 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     Family History  Problem Relation Age of Onset  . Cancer Father     metastatic  . Heart failure Father   . Heart disease Mother   . Heart attack Mother   . Hypertension Mother   . Diabetes Mother     Past Medical History    Diagnosis Date  . Morbid obesity   . Diverticulosis of colon   . Acute pancreatitis     Gallstone pancreatitis  . COPD (chronic obstructive pulmonary disease)   . OSA (obstructive sleep apnea)     Dr. Delton Coombes  . CAD (coronary artery disease)   . Cataract   . Nonexudative senile macular degeneration of retina   . Solitary pulmonary nodule     Dr. Delton Coombes  Followed by serial CTs  . Hyperlipidemia   . Osteoarthritis   . Anemia   . Diabetes mellitus, type 2   . Asthma   . Hypertension   . Stroke   1996     Remote past  . Ejection fraction     EF 55-60%, echo, March, 2013, Good RV function  . Aortic stenosis     Mild, echo, March, 2013,     Past Surgical History  Procedure Date  . Cholecystectomy   . Tonsillectomy     ROS Patient denies fever, chills, headache, sweats, rash, change in vision, change in hearing, chest pain, cough, nausea vomiting, urinary symptoms. All other systems are reviewed and are negative.  PHYSICAL EXAM   Patient is overweight. He is oriented to person time and place. Affect is normal. There is no jugular venous distention. There are no carotid bruits. Lung exam reveals scattered rhonchi. There is no respiratory distress. Cardiac exam reveals an S1 and S2. There no clicks or significant murmurs. The rhythm is irregularly irregular. The abdomen is soft. The patient does have 1+ peripheral edema. He has chronic venous stasis changes with skin discoloration in his lower legs. There are no musculoskeletal deformities. Filed Vitals:   08/16/11 1553  BP: 138/80  Pulse: 78  Resp: 18  Height: 6\' 2"  (1.88 m)  Weight: 319 lb 1.9 oz (144.752 kg)    I have reviewed the EKG from August 11, 2011. Patient has decreased anterior R wave progression. He has atrial fib with a slow ventricular response.  ASSESSMENT & PLAN

## 2011-08-16 NOTE — Assessment & Plan Note (Signed)
The patient is overweight. This plays a significant role in his overall shortness of breath.

## 2011-08-16 NOTE — Assessment & Plan Note (Signed)
We really do not know the specifics of the question of coronary disease. However it appears that in the past he did not have any significant coronary disease.

## 2011-08-16 NOTE — Assessment & Plan Note (Addendum)
The patient is a history of anemia. He says that he had a full GI workup at some time. He was told to stop his aspirin. He says he does not know of any definite proven GI bleeding. I do not think that these had any recent labs. I realize that only after he is left today. We will contact him to obtain more recent labs.There seems to be a hemoglobin of 15 documented from outside lab in 2011.

## 2011-08-16 NOTE — Assessment & Plan Note (Signed)
The patient has significant shortness of breath with exertion. It is felt by the pulmonary team that deconditioning and weight plate major role. At this point it does not appear that he has congestive heart failure. He does have some peripheral edema but I suspect this is from venous insufficiency. He does have normal systolic left ventricular function and right ventricular function by echo earlier this year. He had this problem before his atrial fibrillation was diagnosed. Although I really cannot tell when his last EKG was.

## 2011-08-16 NOTE — Assessment & Plan Note (Signed)
The patient has a small pulmonary nodule that is followed carefully by pulmonary.

## 2011-08-16 NOTE — Assessment & Plan Note (Addendum)
Atrial fibrillation his newly diagnosed from an EKG on August 11, 2011. He says he is not aware of atrial fib in the past. I cannot document any recent EKGs. The patient does not appear to have significant symptoms from the atrial fib. The rate today is slow. I've decided to cut his atenolol dose down to 25 mg daily. From the echo report of March, 2013, the left atrium was reported as only mildly dilated. It is not unreasonable to consider cardioversion. This could be done on a single occasion and we could then decide how to proceed on the long-term based on his response. The key issue however is anticoagulation. There is history of some type of stroke in the past. He has atrial fibrillation now. From the cardiac viewpoint he needs to be anticoagulated. I have called and asked Dr.Norins to help me with this decision. I am uncomfortable deciding on my own whether or not it is safe to use anticoagulation in this patient with his multitude of issues including the history of anemia. I told the patient that I would get back to him to let him know the next step.

## 2011-08-16 NOTE — Assessment & Plan Note (Signed)
This is followed carefully by the pulmonary team.

## 2011-08-16 NOTE — Assessment & Plan Note (Signed)
This is treated by the pulmonary team.

## 2011-08-16 NOTE — Assessment & Plan Note (Signed)
Historically it appears that the patient was not in atrial fibrillation at the time of his stroke in 1996.

## 2011-08-16 NOTE — Patient Instructions (Addendum)
Your physician recommends that you schedule a follow-up appointment in: 2-3 weeks  Your physician has recommended you make the following change in your medication: Decrease your atenolol to 25mg 

## 2011-08-17 ENCOUNTER — Telehealth: Payer: Self-pay | Admitting: Cardiology

## 2011-08-17 ENCOUNTER — Encounter (HOSPITAL_COMMUNITY): Payer: Medicare Other

## 2011-08-17 ENCOUNTER — Other Ambulatory Visit: Payer: Self-pay

## 2011-08-17 DIAGNOSIS — D649 Anemia, unspecified: Secondary | ICD-10-CM

## 2011-08-17 DIAGNOSIS — I10 Essential (primary) hypertension: Secondary | ICD-10-CM

## 2011-08-17 DIAGNOSIS — R0609 Other forms of dyspnea: Secondary | ICD-10-CM

## 2011-08-17 DIAGNOSIS — I4891 Unspecified atrial fibrillation: Secondary | ICD-10-CM

## 2011-08-17 DIAGNOSIS — J449 Chronic obstructive pulmonary disease, unspecified: Secondary | ICD-10-CM

## 2011-08-17 DIAGNOSIS — I2510 Atherosclerotic heart disease of native coronary artery without angina pectoris: Secondary | ICD-10-CM

## 2011-08-17 DIAGNOSIS — I359 Nonrheumatic aortic valve disorder, unspecified: Secondary | ICD-10-CM

## 2011-08-18 ENCOUNTER — Other Ambulatory Visit (INDEPENDENT_AMBULATORY_CARE_PROVIDER_SITE_OTHER): Payer: Medicare Other

## 2011-08-18 DIAGNOSIS — D649 Anemia, unspecified: Secondary | ICD-10-CM

## 2011-08-18 DIAGNOSIS — R0989 Other specified symptoms and signs involving the circulatory and respiratory systems: Secondary | ICD-10-CM

## 2011-08-18 DIAGNOSIS — I10 Essential (primary) hypertension: Secondary | ICD-10-CM

## 2011-08-18 DIAGNOSIS — I2510 Atherosclerotic heart disease of native coronary artery without angina pectoris: Secondary | ICD-10-CM

## 2011-08-18 DIAGNOSIS — J449 Chronic obstructive pulmonary disease, unspecified: Secondary | ICD-10-CM

## 2011-08-18 DIAGNOSIS — R0609 Other forms of dyspnea: Secondary | ICD-10-CM

## 2011-08-18 DIAGNOSIS — I359 Nonrheumatic aortic valve disorder, unspecified: Secondary | ICD-10-CM

## 2011-08-18 DIAGNOSIS — I4891 Unspecified atrial fibrillation: Secondary | ICD-10-CM

## 2011-08-18 LAB — CBC WITH DIFFERENTIAL/PLATELET
Basophils Absolute: 0 10*3/uL (ref 0.0–0.1)
Basophils Relative: 0.6 % (ref 0.0–3.0)
Hemoglobin: 15 g/dL (ref 13.0–17.0)
Lymphocytes Relative: 22.5 % (ref 12.0–46.0)
Monocytes Relative: 7.8 % (ref 3.0–12.0)
Neutro Abs: 5.6 10*3/uL (ref 1.4–7.7)
Neutrophils Relative %: 66.4 % (ref 43.0–77.0)
RBC: 4.94 Mil/uL (ref 4.22–5.81)

## 2011-08-18 LAB — HEPATIC FUNCTION PANEL
AST: 30 U/L (ref 0–37)
Albumin: 3.8 g/dL (ref 3.5–5.2)
Alkaline Phosphatase: 58 U/L (ref 39–117)
Bilirubin, Direct: 0.2 mg/dL (ref 0.0–0.3)

## 2011-08-18 LAB — BASIC METABOLIC PANEL
Calcium: 8.8 mg/dL (ref 8.4–10.5)
Creatinine, Ser: 1 mg/dL (ref 0.4–1.5)
GFR: 75.32 mL/min (ref >60.00)
Sodium: 140 mEq/L (ref 135–145)

## 2011-08-18 LAB — PROTIME-INR: Prothrombin Time: 11.2 s (ref 10.2–12.4)

## 2011-08-19 ENCOUNTER — Encounter (HOSPITAL_COMMUNITY): Payer: Medicare Other

## 2011-08-24 ENCOUNTER — Encounter (HOSPITAL_COMMUNITY): Payer: Medicare Other

## 2011-08-26 ENCOUNTER — Encounter (HOSPITAL_COMMUNITY): Payer: Medicare Other

## 2011-08-27 ENCOUNTER — Other Ambulatory Visit: Payer: Self-pay | Admitting: Internal Medicine

## 2011-08-27 NOTE — Telephone Encounter (Signed)
NOTIFIED PATIENT OF NEED TO MAKE APPT. WITH DR. Debby Bud. LEFT MESSAGE ON HOME # TO  CALL OFFICE. LAST OFFICE VISIT 01/2010

## 2011-08-30 ENCOUNTER — Ambulatory Visit (INDEPENDENT_AMBULATORY_CARE_PROVIDER_SITE_OTHER): Payer: Medicare Other | Admitting: Internal Medicine

## 2011-08-30 ENCOUNTER — Encounter: Payer: Self-pay | Admitting: Internal Medicine

## 2011-08-30 VITALS — BP 110/54 | HR 60 | Temp 98.5°F | Resp 16 | Wt 318.0 lb

## 2011-08-30 DIAGNOSIS — E119 Type 2 diabetes mellitus without complications: Secondary | ICD-10-CM

## 2011-08-30 DIAGNOSIS — I4891 Unspecified atrial fibrillation: Secondary | ICD-10-CM

## 2011-08-30 DIAGNOSIS — D649 Anemia, unspecified: Secondary | ICD-10-CM

## 2011-08-30 NOTE — Progress Notes (Signed)
Subjective:    Patient ID: Charles [name], male    DOB: 21-Dec-1940, 71 y.o.   MRN: [id number]  HPI In the interval since his last visit  he has been seen on several occasions by Dr. Delton Coombes for pulmonary disease - he has been stable on symbicort. He has been referred for pulmonary rehab. During his initial eval he had abnormal rhythm strip, EKG June '13 with a. Atrial fibrillation and was referred to Dr. Myrtis Ser. Dr. Myrtis Ser did reduce tenormin to 25 mg daily.  He has seen Dr. 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 He does have a h/o GI bleed with melanotic stools and anemia that lead to syncope several years ago with no history or recurrent problems. He does not have a chronic anemia problem by his record.   Past Medical History  Diagnosis Date  . Morbid obesity   . Diverticulosis of colon   . Acute pancreatitis     Gallstone pancreatitis  . COPD (chronic obstructive pulmonary disease)   . OSA (obstructive sleep apnea)     Dr. Delton Coombes  . CAD (coronary artery disease)   . Cataract   . Nonexudative senile macular degeneration of retina   . Solitary pulmonary nodule     Dr. Delton Coombes  Followed by serial CTs  . Hyperlipidemia   . Osteoarthritis   . Anemia   . Diabetes mellitus, type 2   . Asthma   . Hypertension   . Stroke   1996     Remote past  . Ejection fraction     EF 55-60%, echo, March, 2013, Good RV function  . Aortic stenosis     Mild, echo, March, 2013,   . Atrial fibrillation     New diagnosis August 11, 2011  . DOE (dyspnea on exertion)     Dr. Delton Coombes,  obesity and deconditioning  playing a major role. No definite pulmonary hypertension on echo.  . Edema     Probably from venous insufficiency   Past Surgical History  Procedure Date  . Cholecystectomy   . Tonsillectomy    Family History  Problem Relation Age of Onset  . Cancer Father     metastatic  . Heart failure Father   . Heart disease Mother   . Heart attack Mother   . Hypertension  Mother   . Diabetes Mother    History   Social History  . Marital Status: Single    Spouse Name: N/A    Number of Children: N/A  . Years of Education: N/A   Occupational History  . retired     Camera operator. professional musician for 12 years.    Social History Main Topics  . Smoking status: Former Smoker -- 3.0 packs/day for 35 years    Quit date: 03/01/1990  . Smokeless tobacco: Not on file  . Alcohol Use: Yes  . Drug Use: No  . Sexually Active: Not on file   Other Topics Concern  . 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     Current Outpatient Prescriptions on File Prior to Visit  Medication Sig Dispense Refill  . Aflibercept (EYLEA) 2 MG/0.05ML SOLN Injection every 12 weeks      . albuterol (PROAIR HFA) 108 (90 BASE) MCG/ACT inhaler Inhale 2 puffs into the lungs every 6 (six) hours as needed.  18 g  11  .  atenolol (TENORMIN) 50 MG tablet Take 0.5 tablets (25 mg total) by mouth daily.  90 tablet  3  . B-D INS SYRINGE 0.5CC/30GX1/2" 30G X 1/2" 0.5 ML MISC USE AS DIRECTED.  200 each  PRN  . budesonide-formoterol (SYMBICORT) 160-4.5 MCG/ACT inhaler Inhale 2 puffs into the lungs 2 (two) times daily.  1 Inhaler  12  . doxycycline (VIBRAMYCIN) 100 MG capsule Take 100 mg by mouth 1 day or 1 dose. As needed for rosacea      . hydrochlorothiazide (HYDRODIURIL) 25 MG tablet TAKE 1 TABLET ONCE DAILY.  90 tablet  3  . lisinopril (PRINIVIL,ZESTRIL) 20 MG tablet TAKE 1 TABLET ONCE DAILY.  90 tablet  3  . metFORMIN (GLUCOPHAGE) 500 MG tablet TAKE 1 TABLET IN THE MORNING AND 2 TABLETS IN THE EVENING.  270 tablet  3  . NOVOLIN N 100 UNIT/ML injection 15 units every morning and 20 to 30 units every evening      . NOVOLIN R 100 UNIT/ML injection INJECT 15-20 UNITS TWICE DAILY BEFORE BREAKFAST AND DINNER,AND 5-15 UNTIS AT BEDTIME.  3 mL  30    . Omega-3 Fatty Acids (FISH OIL) 1000 MG CAPS Take 1 capsule by mouth daily.        . ONE TOUCH ULTRA TEST test strip CHECK BLOOD SUGAR 3 TIMES A DAY.  100 each  11  . simvastatin (ZOCOR) 40 MG tablet TAKE 1 TABLET IN THE P.M.  90 tablet  3  . Sulfacetamide Sodium 10 % SUSP Apply topically daily as directed         Review of Systems System review is negative for any constitutional, cardiac, pulmonary, GI or neuro symptoms or complaints other than as described in the HPI.     Objective:   Physical Exam Filed Vitals:   08/30/11 1530  BP: 110/54  Pulse: 60  Temp: 98.5 F (36.9 C)  Resp: 16   Wt Readings from Last 3 Encounters:  08/30/11 318 lb (144.244 kg)  08/16/11 319 lb 1.9 oz (144.752 kg)  06/21/11 331 lb 9.6 oz (150.413 kg)   Obese White man in no distress HEENT- C&S clear, hearing is normal Chest - good breath sounds, no rales, wheezes or rhonchi Cor- 2+ radial, trace to 1+ DP pulse Abdomen - obese Neuro - A&O, cognition is normal, memory is good.  Lab Results  Component Value Date   WBC 8.4 08/18/2011   HGB 15.0 08/18/2011   HCT 45.7 08/18/2011   PLT 209.0 08/18/2011   GLUCOSE 136* 08/18/2011   CHOL 150 01/13/2010   TRIG 104.0 01/13/2010   HDL 33.70* 01/13/2010   LDLCALC 96 01/13/2010   ALT 17 08/18/2011   AST 30 08/18/2011   NA 140 08/18/2011   K 3.9 08/18/2011   CL 102 08/18/2011   CREATININE 1.0 08/18/2011   BUN 19 08/18/2011   CO2 31 08/18/2011   TSH 1.30 08/18/2011   INR 1.0 08/18/2011   HGBA1C 7.4* 01/13/2010          Assessment & Plan:

## 2011-08-31 ENCOUNTER — Encounter (HOSPITAL_COMMUNITY): Payer: Medicare Other

## 2011-08-31 NOTE — Assessment & Plan Note (Signed)
Lab Results  Component Value Date   HGBA1C 7.4* 01/13/2010   Definitely due for follow -up lab with recommendations to follow.

## 2011-08-31 NOTE — Assessment & Plan Note (Signed)
Newly diagnosed A. Fib. Reviewed 12 lead EKG on file: c/w a.fib He has been asytmptomatic and has not had SRVR. He is in A. Fib on exam.  Plan Medical cardiology management per Dr. Myrtis Ser  Anticoagulation seems appropriate with his increased risk of CVA - will defer to Dr. Myrtis Ser as to appropriate agent: cocumadin vs xeralto vs ASA

## 2011-08-31 NOTE — Assessment & Plan Note (Signed)
Lab Results  Component Value Date   HGB 15.0 08/18/2011   Excellent - not c/w active anemia

## 2011-09-02 ENCOUNTER — Encounter (HOSPITAL_COMMUNITY): Payer: Medicare Other

## 2011-09-02 DIAGNOSIS — J449 Chronic obstructive pulmonary disease, unspecified: Secondary | ICD-10-CM | POA: Insufficient documentation

## 2011-09-02 DIAGNOSIS — Z5189 Encounter for other specified aftercare: Secondary | ICD-10-CM | POA: Insufficient documentation

## 2011-09-02 DIAGNOSIS — G4733 Obstructive sleep apnea (adult) (pediatric): Secondary | ICD-10-CM | POA: Insufficient documentation

## 2011-09-02 DIAGNOSIS — J4489 Other specified chronic obstructive pulmonary disease: Secondary | ICD-10-CM | POA: Insufficient documentation

## 2011-09-06 ENCOUNTER — Ambulatory Visit (INDEPENDENT_AMBULATORY_CARE_PROVIDER_SITE_OTHER): Payer: Medicare Other | Admitting: Cardiology

## 2011-09-06 ENCOUNTER — Encounter: Payer: Self-pay | Admitting: Cardiology

## 2011-09-06 VITALS — BP 120/68 | HR 66 | Ht 74.0 in | Wt 319.0 lb

## 2011-09-06 DIAGNOSIS — D649 Anemia, unspecified: Secondary | ICD-10-CM

## 2011-09-06 DIAGNOSIS — I4891 Unspecified atrial fibrillation: Secondary | ICD-10-CM

## 2011-09-06 MED ORDER — RIVAROXABAN 20 MG PO TABS: 20.0000 mg | ORAL_TABLET | Freq: Every day | ORAL | Status: DC

## 2011-09-06 NOTE — Patient Instructions (Addendum)
Your physician has recommended you make the following change in your medication: START Xarelto 20mg  daily  Your physician recommends that you schedule a follow-up appointment in: 4 weeks

## 2011-09-06 NOTE — Assessment & Plan Note (Signed)
The patient's rate is controlled. Today we will start Xarelto. I've described this and discussed with the patient at great length. I will then see him back in several weeks and we will decide about proceeding with cardioversion. I am in favor of trying this on one occasion.

## 2011-09-06 NOTE — Assessment & Plan Note (Signed)
Hemoglobin is stable. We are able now to start anticoagulation.

## 2011-09-06 NOTE — Progress Notes (Signed)
OBJECTIVE:  HPI Patient is seen for followup atrial fibrillation. I've seen him for cardiac evaluation and felt that he should be treated with anticoagulants. However I was worried about his prior history of anemia. He has seen his primary physician since then. His hemoglobin is stable and is felt that he is not had any significant bleeding risk at this time. His atrial fibrillation controlled.  No Known Allergies  Current Outpatient Prescriptions  Medication Sig Dispense Refill  . Aflibercept (EYLEA) 2 MG/0.05ML SOLN Injection every 12 weeks      . albuterol (PROAIR HFA) 108 (90 BASE) MCG/ACT inhaler Inhale 2 puffs into the lungs every 6 (six) hours as needed.  18 g  11  . atenolol (TENORMIN) 50 MG tablet Take 0.5 tablets (25 mg total) by mouth daily.  90 tablet  3  . B-D INS SYRINGE 0.5CC/30GX1/2" 30G X 1/2" 0.5 ML MISC USE AS DIRECTED.  200 each  PRN  . budesonide-formoterol (SYMBICORT) 160-4.5 MCG/ACT inhaler Inhale 2 puffs into the lungs 2 (two) times daily.  1 Inhaler  12  . doxycycline (VIBRAMYCIN) 100 MG capsule Take 100 mg by mouth 1 day or 1 dose. As needed for rosacea      . hydrochlorothiazide (HYDRODIURIL) 25 MG tablet TAKE 1 TABLET ONCE DAILY.  90 tablet  3  . lisinopril (PRINIVIL,ZESTRIL) 20 MG tablet TAKE 1 TABLET ONCE DAILY.  90 tablet  3  . metFORMIN (GLUCOPHAGE) 500 MG tablet TAKE 1 TABLET IN THE MORNING AND 2 TABLETS IN THE EVENING.  270 tablet  3  . NOVOLIN N 100 UNIT/ML injection 15 units every morning and 20 to 30 units every evening      . NOVOLIN R 100 UNIT/ML injection INJECT 15-20 UNITS TWICE DAILY BEFORE BREAKFAST AND DINNER,AND 5-15 UNTIS AT BEDTIME.  3 mL  30  . Omega-3 Fatty Acids (FISH OIL) 1000 MG CAPS Take 1 capsule by mouth daily.        . ONE TOUCH ULTRA TEST test strip CHECK BLOOD SUGAR 3 TIMES A DAY.  100 each  11  . simvastatin (ZOCOR) 40 MG tablet TAKE 1 TABLET IN THE P.M.  90 tablet  3  . Sulfacetamide Sodium 10 % SUSP Apply topically daily as directed       . Rivaroxaban (XARELTO) 20 MG TABS Take 1 tablet (20 mg total) by mouth daily.  30 tablet  11    History   Social History  . Marital Status: Single    Spouse Name: N/A    Number of Children: N/A  . Years of Education: N/A   Occupational History  . retired     Camera operator. professional musician for 12 years.    Social History Main Topics  . Smoking status: Former Smoker -- 3.0 packs/day for 35 years    Quit date: 03/01/1990  . Smokeless tobacco: Not on file  . Alcohol Use: Yes  . Drug Use: No  . Sexually Active: Not on file   Other Topics Concern  . 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     Family History  Problem Relation Age of Onset  . Cancer Father     metastatic  . Heart failure Father   . Heart disease Mother   . Heart attack Mother   . Hypertension Mother   . Diabetes Mother     Past Medical History  Diagnosis Date  . Morbid obesity   . Diverticulosis of colon   . Acute pancreatitis     Gallstone pancreatitis  . COPD (chronic obstructive pulmonary disease)   . OSA (obstructive sleep apnea)     Dr. Delton Coombes  . CAD (coronary artery disease)   . Cataract   . Nonexudative senile macular degeneration of retina   . Solitary pulmonary nodule     Dr. Delton Coombes  Followed by serial CTs  . Hyperlipidemia   . Osteoarthritis   . Anemia   . Diabetes mellitus, type 2   . Asthma   . Hypertension   . Stroke   1996     Remote past  . Ejection fraction     EF 55-60%, echo, March, 2013, Good RV function  . Aortic stenosis     Mild, echo, March, 2013,   . Atrial fibrillation     New diagnosis August 11, 2011  . DOE (dyspnea on exertion)     Dr. Delton Coombes,  obesity and deconditioning  playing a major role. No definite pulmonary hypertension on echo.  . Edema     Probably from venous insufficiency    Past Surgical  History  Procedure Date  . Cholecystectomy   . Tonsillectomy     ROS  Patient denies fever, chills, headache, sweats, rash, change in vision, change in hearing, chest pain, cough, nausea vomiting, urinary symptoms. All other systems are reviewed and are negative.  PHYSICAL EXAM  Patient overweight. He is oriented to person time and place. Affect is normal. There is no jugular venous distention. Lungs are clear. Respiratory effort is nonlabored. Cardiac exam reveals S1 and S2. There no clicks or significant murmurs. The abdomen is soft. Is no peripheral edema.  Filed Vitals:   09/06/11 1153  BP: 120/68  Pulse: 66  Height: 6\' 2"  (1.88 m)  Weight: 319 lb (144.697 kg)   EKG is done today and reviewed by me. He is still in atrial fibrillation. The rate is controlled.  ASSESSMENT & PLAN

## 2011-09-07 ENCOUNTER — Encounter (HOSPITAL_COMMUNITY): Payer: Medicare Other

## 2011-09-08 ENCOUNTER — Telehealth: Payer: Self-pay

## 2011-09-08 NOTE — Telephone Encounter (Signed)
Preauthorization approved on pt.  Pt ID: [id number].  Reference #: [id number].  Exp date: 09/07/2012.  [facility] pharmacy was given this information.  Pt was notified.

## 2011-09-09 ENCOUNTER — Encounter (HOSPITAL_COMMUNITY)
Admission: RE | Admit: 2011-09-09 | Discharge: 2011-09-09 | Disposition: A | Discharge status: Home / Self Care | Payer: Medicare Other | Source: Ambulatory Visit | Attending: Emergency Medicine | Admitting: Emergency Medicine

## 2011-09-09 NOTE — Progress Notes (Signed)
Ist day of exercise in Pulmonary Rehab.  Charles [name] was oriented to equipment use and safety.  Demonstration and practice of PLB on each work station.  Tolerated exercise well.  We will support and encourage.

## 2011-09-14 ENCOUNTER — Encounter (HOSPITAL_COMMUNITY)
Admission: RE | Admit: 2011-09-14 | Discharge: 2011-09-14 | Disposition: A | Discharge status: Home / Self Care | Payer: Medicare Other | Source: Ambulatory Visit | Attending: Emergency Medicine | Admitting: Emergency Medicine

## 2011-09-14 LAB — GLUCOSE, CAPILLARY: Glucose-Capillary: 194 mg/dL — ABNORMAL HIGH (ref 70–99)

## 2011-09-16 ENCOUNTER — Encounter (HOSPITAL_COMMUNITY)
Admission: RE | Admit: 2011-09-16 | Discharge: 2011-09-16 | Disposition: A | Discharge status: Home / Self Care | Payer: Medicare Other | Source: Ambulatory Visit | Attending: Emergency Medicine | Admitting: Emergency Medicine

## 2011-09-16 LAB — GLUCOSE, CAPILLARY: Glucose-Capillary: 195 mg/dL — ABNORMAL HIGH (ref 70–99)

## 2011-09-21 ENCOUNTER — Encounter (HOSPITAL_COMMUNITY)
Admission: RE | Admit: 2011-09-21 | Discharge: 2011-09-21 | Disposition: A | Discharge status: Home / Self Care | Payer: Medicare Other | Source: Ambulatory Visit | Attending: Emergency Medicine | Admitting: Emergency Medicine

## 2011-09-23 ENCOUNTER — Encounter (HOSPITAL_COMMUNITY)
Admission: RE | Admit: 2011-09-23 | Discharge: 2011-09-23 | Disposition: A | Discharge status: Home / Self Care | Payer: Medicare Other | Source: Ambulatory Visit | Attending: Emergency Medicine | Admitting: Emergency Medicine

## 2011-09-28 ENCOUNTER — Encounter (HOSPITAL_COMMUNITY)
Admission: RE | Admit: 2011-09-28 | Discharge: 2011-09-28 | Disposition: A | Discharge status: Home / Self Care | Payer: Medicare Other | Source: Ambulatory Visit | Attending: Emergency Medicine | Admitting: Emergency Medicine

## 2011-09-30 ENCOUNTER — Encounter (HOSPITAL_COMMUNITY)
Admission: RE | Admit: 2011-09-30 | Discharge: 2011-09-30 | Disposition: A | Discharge status: Home / Self Care | Payer: Medicare Other | Source: Ambulatory Visit | Attending: Emergency Medicine | Admitting: Emergency Medicine

## 2011-09-30 DIAGNOSIS — J449 Chronic obstructive pulmonary disease, unspecified: Secondary | ICD-10-CM | POA: Insufficient documentation

## 2011-09-30 DIAGNOSIS — G4733 Obstructive sleep apnea (adult) (pediatric): Secondary | ICD-10-CM | POA: Insufficient documentation

## 2011-09-30 DIAGNOSIS — Z5189 Encounter for other specified aftercare: Secondary | ICD-10-CM | POA: Insufficient documentation

## 2011-09-30 DIAGNOSIS — J4489 Other specified chronic obstructive pulmonary disease: Secondary | ICD-10-CM | POA: Insufficient documentation

## 2011-09-30 LAB — GLUCOSE, CAPILLARY: Glucose-Capillary: 138 mg/dL — ABNORMAL HIGH (ref 70–99)

## 2011-10-05 ENCOUNTER — Encounter (HOSPITAL_COMMUNITY)
Admission: RE | Admit: 2011-10-05 | Discharge: 2011-10-05 | Disposition: A | Discharge status: Home / Self Care | Payer: Medicare Other | Source: Ambulatory Visit | Attending: Emergency Medicine | Admitting: Emergency Medicine

## 2011-10-07 ENCOUNTER — Telehealth: Payer: Self-pay

## 2011-10-07 ENCOUNTER — Encounter (HOSPITAL_COMMUNITY)
Admission: RE | Admit: 2011-10-07 | Discharge: 2011-10-07 | Payer: Medicare Other | Source: Ambulatory Visit | Attending: Emergency Medicine | Admitting: Emergency Medicine

## 2011-10-07 ENCOUNTER — Other Ambulatory Visit: Payer: Self-pay | Admitting: Cardiology

## 2011-10-07 ENCOUNTER — Ambulatory Visit (INDEPENDENT_AMBULATORY_CARE_PROVIDER_SITE_OTHER)
Admission: RE | Admit: 2011-10-07 | Discharge: 2011-10-07 | Disposition: A | Discharge status: Home / Self Care | Payer: Medicare Other | Source: Ambulatory Visit | Attending: Cardiology | Admitting: Cardiology

## 2011-10-07 ENCOUNTER — Ambulatory Visit (INDEPENDENT_AMBULATORY_CARE_PROVIDER_SITE_OTHER): Payer: Medicare Other | Admitting: Cardiology

## 2011-10-07 ENCOUNTER — Encounter: Payer: Self-pay | Admitting: Cardiology

## 2011-10-07 VITALS — BP 110/60 | HR 57 | Ht 74.0 in | Wt 316.0 lb

## 2011-10-07 DIAGNOSIS — R0609 Other forms of dyspnea: Secondary | ICD-10-CM

## 2011-10-07 DIAGNOSIS — I4891 Unspecified atrial fibrillation: Secondary | ICD-10-CM

## 2011-10-07 DIAGNOSIS — R0989 Other specified symptoms and signs involving the circulatory and respiratory systems: Secondary | ICD-10-CM

## 2011-10-07 LAB — BASIC METABOLIC PANEL
CO2: 29 mEq/L (ref 19–32)
Chloride: 99 mEq/L (ref 96–112)
Potassium: 3.7 mEq/L (ref 3.5–5.1)
Sodium: 138 mEq/L (ref 135–145)

## 2011-10-07 LAB — CBC WITH DIFFERENTIAL/PLATELET
Basophils Relative: 0.6 % (ref 0.0–3.0)
Eosinophils Absolute: 0.3 10*3/uL (ref 0.0–0.7)
HCT: 44.5 % (ref 39.0–52.0)
Hemoglobin: 14.4 g/dL (ref 13.0–17.0)
Lymphocytes Relative: 17.5 % (ref 12.0–46.0)
Lymphs Abs: 1.5 10*3/uL (ref 0.7–4.0)
MCHC: 32.4 g/dL (ref 30.0–36.0)
MCV: 93.1 fl (ref 78.0–100.0)
Monocytes Absolute: 0.5 10*3/uL (ref 0.1–1.0)
Neutro Abs: 6 10*3/uL (ref 1.4–7.7)
RBC: 4.78 Mil/uL (ref 4.22–5.81)

## 2011-10-07 NOTE — Progress Notes (Signed)
Charles [name] 71 y.o. male             Pulmonary Rehab Nutrition Screen  Please answer the following questions:             YES  NO    Do you live in a nursing home?  X   Do you eat out more than 3 times per week?    X If yes, how many times per week do you eat out?   Do you have food allergies?   X If yes, what are you allergic to?  Have you gained or lost more than 10 lbs without trying?               X If yes, how much weight have you  lost or gained?  lbs over  weeks/mo   Do you want to lose weight?    X  If yes, what is a goal weight or amount of weight you would like to lose? 100 lbs  Do you eat alone most of the time?  X    Do you eat less than 2 meals/day?  X If yes, how many meals do you eat?  Do you use canned and convenience food? X    Do you use a salt shaker?  X   Do you drink more than 3 alcoholic drinks/day?  X If yes, how many drinks per day?  Are you having trouble with constipation? * X  If yes, what are you doing to help relieve constipation?  Do you have financial difficulties with buying food? *  X   Do you usually need help with grocery shopping or with cooking? *  X   Do you have a poor appetite? *                                       X   Do you have trouble chewing/ swallowing? *   X   Do you take vitamin and mineral or herbal supplements? * X  If yes, what kind of supplements do you currently take? Fish oil    Ht: 73" Ht Readings from Last 1 Encounters:  10/07/11 6\' 2"  (1.88 m)    Wt:   323.8 lb (147.2 kg) Wt Readings from Last 3 Encounters:  10/07/11 316 lb (143.337 kg)  09/06/11 319 lb (144.697 kg)  08/30/11 318 lb (144.244 kg)    BMI: 42.8  40.6%body fat                       Rate Your Plate Score: 44  Diagnosis: COPD Past Medical History  Diagnosis Date  . Morbid obesity   . Diverticulosis of colon   . Acute pancreatitis     Gallstone pancreatitis  . COPD (chronic obstructive pulmonary disease)   . OSA (obstructive sleep apnea)     Dr.  Delton Coombes  . CAD (coronary artery disease)   . Cataract   . Nonexudative senile macular degeneration of retina   . Solitary pulmonary nodule     Dr. Delton Coombes  Followed by serial CTs  . Hyperlipidemia   . Osteoarthritis   . Anemia   . Diabetes mellitus, type 2   . Asthma   . Hypertension   . Stroke   1996     Remote past  . Ejection fraction     EF 55-60%, echo, March,  2013, Good RV function  . Aortic stenosis     Mild, echo, March, 2013,   . Atrial fibrillation     New diagnosis August 11, 2011  . DOE (dyspnea on exertion)     Dr. Delton Coombes,  obesity and deconditioning  playing a major role. No definite pulmonary hypertension on echo.  . Edema     Probably from venous insufficiency     Meds reviewed: Hydrochlorothiazide, Metformin, Novolin N, Novolin R, Fish oil    Wt goal: 300-312 lb (136.3-141.7 kg) Current tobacco use? No Food/Drug Interaction? No Labs:  Lipid Panel  No recent lipids noted. Lab Results  Component Value Date   HGBA1C 7.4* 01/13/2010   Estimated Daily Nutrition Needs for: ? wt loss  2200-2450 Kcal, 120-145 gm protein, sodium less than 1500 mg, Grams of CHO 325  Nutrition Risk Level: High

## 2011-10-07 NOTE — Telephone Encounter (Signed)
Pt was called and given the following instructions per Dr Myrtis Ser regarding his cardioversion instructions:" Hold your insulin and metformin the morning of your procedure.  Take a half-dose of the insulin the evening before your procedure.  Have some clear liquids with sugar before 7:00am the morning of your procedure to prevent your blood sugar from dropping too low."

## 2011-10-07 NOTE — Assessment & Plan Note (Signed)
The patient has persistent atrial fibrillation. He is now been very carefully anticoagulated. We will proceed with cardioversion. I've had an extensive discussion with him today about the pros and cons. Our plan will be to try cardioversion on one occasion. I explained risks and benefits to him.

## 2011-10-07 NOTE — Patient Instructions (Addendum)
You are scheduled for a cardioversion on 10/11/11, please refer to your instruction letter  Hold your insulin and metformin the morning of your procedure.  Take a half-dose of the insulin the evening before your procedure.  Have some clear liquids with sugar before 7:00am the morning of your procedure to prevent your blood sugar from dropping too low per Dr Myrtis Ser.

## 2011-10-07 NOTE — Progress Notes (Signed)
Charles [name] 71 y.o. male             Pulmonary Rehab Nutrition Screen  Please answer the following questions:             YES  NO    Do you live in a nursing home?  X   Do you eat out more than 3 times per week?    X If yes, how many times per week do you eat out?   Do you have food allergies?   X If yes, what are you allergic to?  Have you gained or lost more than 10 lbs without trying?               X If yes, how much weight have you  lost or gained?  lbs over  weeks/mo   Do you want to lose weight?    X  If yes, what is a goal weight or amount of weight you would like to lose? 100 lbs  Do you eat alone most of the time?  X    Do you eat less than 2 meals/day?  X If yes, how many meals do you eat?  Do you use canned and convenience food? X    Do you use a salt shaker?  X   Do you drink more than 3 alcoholic drinks/day?  X If yes, how many drinks per day?  Are you having trouble with constipation? * X  If yes, what are you doing to help relieve constipation?  Do you have financial difficulties with buying food? *  X   Do you usually need help with grocery shopping or with cooking? *  X   Do you have a poor appetite? *                                       X   Do you have trouble chewing/ swallowing? *   X   Do you take vitamin and mineral or herbal supplements? * X  If yes, what kind of supplements do you currently take? Fish oil    Ht: 73" Ht Readings from Last 1 Encounters:  09/06/11 6\' 2"  (1.88 m)    Wt:   323.8 lb (147.2 kg) Wt Readings from Last 3 Encounters:  09/06/11 319 lb (144.697 kg)  08/30/11 318 lb (144.244 kg)  08/16/11 319 lb 1.9 oz (144.752 kg)    BMI: 42.8  40.6%body fat                       Rate Your Plate Score: 44  Diagnosis: COPD Past Medical History  Diagnosis Date  . Morbid obesity   . Diverticulosis of colon   . Acute pancreatitis     Gallstone pancreatitis  . COPD (chronic obstructive pulmonary disease)   . OSA (obstructive sleep apnea)    Dr. Delton Coombes  . CAD (coronary artery disease)   . Cataract   . Nonexudative senile macular degeneration of retina   . Solitary pulmonary nodule     Dr. Delton Coombes  Followed by serial CTs  . Hyperlipidemia   . Osteoarthritis   . Anemia   . Diabetes mellitus, type 2   . Asthma   . Hypertension   . Stroke   1996     Remote past  . Ejection fraction     EF 55-60%, echo, March,  2013, Good RV function  . Aortic stenosis     Mild, echo, March, 2013,   . Atrial fibrillation     New diagnosis August 11, 2011  . DOE (dyspnea on exertion)     Dr. Delton Coombes,  obesity and deconditioning  playing a major role. No definite pulmonary hypertension on echo.  . Edema     Probably from venous insufficiency   Meds reviewed: Hydrochlorothiazide, Metformin, Novolin N, Novolin R, Fish oil   Wt goal: 300-312 lb ( 136.3-141.7 kg) Current tobacco use? No Food/Drug Interaction? No Labs:  Lipid Panel  No recent lipids noted. Lab Results  Component Value Date   HGBA1C 7.4* 01/13/2010   CBG's: Pre-exercise CBG's: 97-201 mg/dL Post-exercise CBG's: 16-109 mg/dL  Estimated Daily Nutrition Needs for: ? wt loss  2200-2450 Kcal, 120-145 gm protein, Sodium less than 1500 mg, Grams of CHO 325  Nutrition Risk Level: High

## 2011-10-07 NOTE — Progress Notes (Signed)
HPI  Patient is seen today to followup atrial fibrillation. He has now been on Xarelto for one month very carefully. He missed only one dose. I had a careful discussion with him and we will proceed with cardioversion. His EKG today shows atrial fib. He has a fairly slow response. No Known Allergies  Current Outpatient Prescriptions  Medication Sig Dispense Refill  . Aflibercept (EYLEA) 2 MG/0.05ML SOLN Injection every 12 weeks      . albuterol (PROAIR HFA) 108 (90 BASE) MCG/ACT inhaler Inhale 2 puffs into the lungs every 6 (six) hours as needed.  18 g  11  . atenolol (TENORMIN) 50 MG tablet Take 0.5 tablets (25 mg total) by mouth daily.  90 tablet  3  . B-D INS SYRINGE 0.5CC/30GX1/2" 30G X 1/2" 0.5 ML MISC USE AS DIRECTED.  200 each  PRN  . budesonide-formoterol (SYMBICORT) 160-4.5 MCG/ACT inhaler Inhale 2 puffs into the lungs 2 (two) times daily.  1 Inhaler  12  . doxycycline (VIBRAMYCIN) 100 MG capsule Take 100 mg by mouth 1 day or 1 dose. As needed for rosacea      . hydrochlorothiazide (HYDRODIURIL) 25 MG tablet TAKE 1 TABLET ONCE DAILY.  90 tablet  3  . lisinopril (PRINIVIL,ZESTRIL) 20 MG tablet TAKE 1 TABLET ONCE DAILY.  90 tablet  3  . metFORMIN (GLUCOPHAGE) 500 MG tablet TAKE 1 TABLET IN THE MORNING AND 2 TABLETS IN THE EVENING.  270 tablet  3  . NOVOLIN N 100 UNIT/ML injection 15 units every morning and 20 to 30 units every evening      . NOVOLIN R 100 UNIT/ML injection INJECT 15-20 UNITS TWICE DAILY BEFORE BREAKFAST AND DINNER,AND 5-15 UNTIS AT BEDTIME.  3 mL  30  . ONE TOUCH ULTRA TEST test strip CHECK BLOOD SUGAR 3 TIMES A DAY.  100 each  11  . Rivaroxaban (XARELTO) 20 MG TABS Take 1 tablet (20 mg total) by mouth daily.  30 tablet  11  . simvastatin (ZOCOR) 40 MG tablet TAKE 1 TABLET IN THE P.M.  90 tablet  3  . Sulfacetamide Sodium 10 % SUSP Apply topically daily as directed      . Omega-3 Fatty Acids (FISH OIL) 1000 MG CAPS Take 1 capsule by mouth daily.          History    Social History  . Marital Status: Single    Spouse Name: N/A    Number of Children: N/A  . Years of Education: N/A   Occupational History  . retired     Camera operator. professional musician for 12 years.    Social History Main Topics  . Smoking status: Former Smoker -- 3.0 packs/day for 35 years    Quit date: 03/01/1990  . Smokeless tobacco: Not on file  . Alcohol Use: Yes  . Drug Use: No  . Sexually Active: Not on file   Other Topics Concern  . 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     Family History  Problem Relation Age of Onset  . Cancer Father     metastatic  . Heart failure Father   . Heart disease Mother   . Heart attack Mother   . Hypertension Mother   . Diabetes Mother     Past Medical History  Diagnosis Date  . Morbid obesity   . Diverticulosis of colon   .  Acute pancreatitis     Gallstone pancreatitis  . COPD (chronic obstructive pulmonary disease)   . OSA (obstructive sleep apnea)     Dr. Delton Coombes  . CAD (coronary artery disease)   . Cataract   . Nonexudative senile macular degeneration of retina   . Solitary pulmonary nodule     Dr. Delton Coombes  Followed by serial CTs  . Hyperlipidemia   . Osteoarthritis   . Anemia   . Diabetes mellitus, type 2   . Asthma   . Hypertension   . Stroke   1996     Remote past  . Ejection fraction     EF 55-60%, echo, March, 2013, Good RV function  . Aortic stenosis     Mild, echo, March, 2013,   . Atrial fibrillation     New diagnosis August 11, 2011  . DOE (dyspnea on exertion)     Dr. Delton Coombes,  obesity and deconditioning  playing a major role. No definite pulmonary hypertension on echo.  . Edema     Probably from venous insufficiency    Past Surgical History  Procedure Date  . Cholecystectomy   . Tonsillectomy     ROS   Patient denies fever, chills,  headache, sweats, rash, change in vision, change in hearing, chest pain, cough, nausea vomiting, urinary symptoms. He does have shortness of breath. He has fatigue.  PHYSICAL EXAM   Patient is overweight. He is oriented to person time and place. Affect is normal. Lungs are clear. Respiratory effort is nonlabored. Cardiac exam reveals S1 and S2. There no clicks or significant murmurs. The rhythm is irregularly irregular. Abdomen is soft. Is no peripheral edema.  Filed Vitals:   10/07/11 1213  BP: 110/60  Pulse: 57  Height: 6\' 2"  (1.88 m)  Weight: 316 lb (143.337 kg)  SpO2: 97%   EKG is done today and reviewed by me. There is atrial fibrillation with a slow rate.  ASSESSMENT & PLAN

## 2011-10-07 NOTE — Assessment & Plan Note (Signed)
Patient has significant lung disease. We are trying to see if getting him back to sinus rhythm will help with his breathing.

## 2011-10-07 NOTE — H&P (Signed)
Charles [name]   10/07/2011 12:00 PM Office Visit  MRN: [id number]   Description: 71 year old male  Provider: Willa Rough, MD  Department: [facility]        Referring Provider     Jacques Navy, MD      Diagnoses     Atrial fibrillation   - Primary    427.31    DOE (dyspnea on exertion)     786.09      Reason for Visit     Appointment         Progress Notes     Willa Rough, MD  10/07/2011  3:29 PM  Signed    HPI  Patient is seen today to followup atrial fibrillation. He has now been on Xarelto for one month very carefully. He missed only one dose. I had a careful discussion with him and we will proceed with cardioversion. His EKG today shows atrial fib. He has a fairly slow response. No Known Allergies    Current Outpatient Prescriptions   Medication  Sig  Dispense  Refill   .  Aflibercept (EYLEA) 2 MG/0.05ML SOLN  Injection every 12 weeks         .  albuterol (PROAIR HFA) 108 (90 BASE) MCG/ACT inhaler  Inhale 2 puffs into the lungs every 6 (six) hours as needed.   18 g   11   .  atenolol (TENORMIN) 50 MG tablet  Take 0.5 tablets (25 mg total) by mouth daily.   90 tablet   3   .  B-D INS SYRINGE 0.5CC/30GX1/2" 30G X 1/2" 0.5 ML MISC  USE AS DIRECTED.   200 each   PRN   .  budesonide-formoterol (SYMBICORT) 160-4.5 MCG/ACT inhaler  Inhale 2 puffs into the lungs 2 (two) times daily.   1 Inhaler   12   .  doxycycline (VIBRAMYCIN) 100 MG capsule  Take 100 mg by mouth 1 day or 1 dose. As needed for rosacea         .  hydrochlorothiazide (HYDRODIURIL) 25 MG tablet  TAKE 1 TABLET ONCE DAILY.   90 tablet   3   .  lisinopril (PRINIVIL,ZESTRIL) 20 MG tablet  TAKE 1 TABLET ONCE DAILY.   90 tablet   3   .  metFORMIN (GLUCOPHAGE) 500 MG tablet  TAKE 1 TABLET IN THE MORNING AND 2 TABLETS IN THE EVENING.   270 tablet   3   .  NOVOLIN N 100 UNIT/ML injection  15 units every morning and 20 to 30 units every evening         .  NOVOLIN R 100 UNIT/ML injection  INJECT  15-20 UNITS TWICE DAILY BEFORE BREAKFAST AND DINNER,AND 5-15 UNTIS AT BEDTIME.   3 mL   30   .  ONE TOUCH ULTRA TEST test strip  CHECK BLOOD SUGAR 3 TIMES A DAY.   100 each   11   .  Rivaroxaban (XARELTO) 20 MG TABS  Take 1 tablet (20 mg total) by mouth daily.   30 tablet   11   .  simvastatin (ZOCOR) 40 MG tablet  TAKE 1 TABLET IN THE P.M.   90 tablet   3   .  Sulfacetamide Sodium 10 % SUSP  Apply topically daily as directed         .  Omega-3 Fatty Acids (FISH OIL) 1000 MG CAPS  Take 1 capsule by mouth daily.  History       Social History   .  Marital Status:  Single       Spouse Name:  N/A       Number of Children:  N/A   .  Years of Education:  N/A       Occupational History   .  retired         Camera operator. professional musician for 12 years.        Social History Main Topics   .  Smoking status:  Former Smoker -- 3.0 packs/day for 35 years       Quit date:  03/01/1990   .  Smokeless tobacco:  Not on file   .  Alcohol Use:  Yes   .  Drug Use:  No   .  Sexually Active:  Not on file       Other Topics  Concern   .  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        Family History   Problem  Relation  Age of Onset   .  Cancer  Father         metastatic   .  Heart failure  Father     .  Heart disease  Mother     .  Heart attack  Mother     .  Hypertension  Mother     .  Diabetes  Mother         Past Medical History   Diagnosis  Date   .  Morbid obesity     .  Diverticulosis of colon     .  Acute pancreatitis         Gallstone pancreatitis   .  COPD (chronic obstructive pulmonary disease)     .  OSA (obstructive sleep apnea)         Dr. Delton Coombes   .  CAD (coronary artery disease)     .  Cataract     .  Nonexudative senile macular degeneration of retina     .  Solitary pulmonary nodule          Dr. Delton Coombes  Followed by serial CTs   .  Hyperlipidemia     .  Osteoarthritis     .  Anemia     .  Diabetes mellitus, type 2     .  Asthma     .  Hypertension     .  Stroke   1996         Remote past   .  Ejection fraction         EF 55-60%, echo, March, 2013, Good RV function   .  Aortic stenosis         Mild, echo, March, 2013,    .  Atrial fibrillation         New diagnosis August 11, 2011   .  DOE (dyspnea on exertion)         Dr. Delton Coombes,  obesity and deconditioning  playing a major role. No definite pulmonary hypertension on echo.   .  Edema         Probably from venous insufficiency       Past Surgical History   Procedure  Date   .  Cholecystectomy     .  Tonsillectomy        ROS    Patient denies fever, chills, headache, sweats, rash, change in vision, change in hearing, chest pain, cough, nausea vomiting, urinary symptoms. He does have shortness of breath. He has fatigue.   PHYSICAL EXAM   Patient is overweight. He is oriented to person time and place. Affect is normal. Lungs are clear. Respiratory effort is nonlabored. Cardiac exam reveals S1 and S2. There no clicks or significant murmurs. The rhythm is irregularly irregular. Abdomen is soft. Is no peripheral edema.    Filed Vitals:     10/07/11 1213   BP:  110/60   Pulse:  57   Height:  6\' 2"  (1.88 m)   Weight:  316 lb (143.337 kg)   SpO2:  97%    EKG is done today and reviewed by me. There is atrial fibrillation with a slow rate.   ASSESSMENT & PLAN        DOE (dyspnea on exertion) - Willa Rough, MD  10/07/2011 12:48 PM  Signed Patient has significant lung disease. We are trying to see if getting him back to sinus rhythm will help with his breathing.  Atrial fibrillation - Willa Rough, MD  10/07/2011 12:49 PM  Signed The patient has persistent atrial fibrillation. He is now been very carefully anticoagulated. We will proceed with cardioversion. I've had an extensive discussion with him today about the pros  and cons. Our plan will be to try cardioversion on one occasion. I explained risks and benefits to him.

## 2011-10-09 ENCOUNTER — Telehealth: Payer: Self-pay | Admitting: Cardiology

## 2011-10-09 NOTE — Telephone Encounter (Signed)
Returned a call to Mr. Tursi. Reviewed previous clinic note. Patient has long standing atrial fibrillation, is on chronic xarelto, and is scheduled for DCCV on 10/11/11. He noted hematuria today and wanted to know if he should continue his xarelto. He has only urinated once today and noted a small amount of blood in the toilet. He denied dysuria or any other areas of bleeding. He noted that two weeks ago he had hematuria one day that resolved by the end of the day. He denies any chest pain, sob, or dizziness. After speaking with Dr. Myrtis Ser, I instructed the patient to stop taking his xarelto and call the office on Monday to arrange for an appointment with Dr. Myrtis Ser to discuss further evaluation of his hematuria. The DCCV will need to be cancelled. I also instructed him to seek evaluation if the bleeding worsens, he notices bleeding elsewhere, or he develops any of the symptoms above. He stated understanding.  [place], PA-C 10/09/2011 12:47 PM

## 2011-10-10 MED ORDER — SODIUM CHLORIDE 0.45 % IV SOLN: INTRAVENOUS | Status: DC

## 2011-10-11 ENCOUNTER — Encounter (HOSPITAL_COMMUNITY): Admission: RE | Payer: Self-pay | Source: Ambulatory Visit

## 2011-10-11 ENCOUNTER — Telehealth: Payer: Self-pay | Admitting: Cardiology

## 2011-10-11 ENCOUNTER — Ambulatory Visit (HOSPITAL_COMMUNITY): Admission: RE | Admit: 2011-10-11 | Payer: Medicare Other | Source: Ambulatory Visit | Admitting: Cardiology

## 2011-10-11 ENCOUNTER — Encounter: Payer: Self-pay | Admitting: Cardiology

## 2011-10-11 SURGERY — CARDIOVERSION
Anesthesia: Monitor Anesthesia Care

## 2011-10-11 NOTE — Telephone Encounter (Signed)
Pt was told that the cardioversion is on indefinate hold at this time.  Dr Myrtis Ser will call and advise him further.

## 2011-10-11 NOTE — Telephone Encounter (Signed)
New problem:  Patient calling checking on the status of cardioversion.

## 2011-10-11 NOTE — Progress Notes (Signed)
   The patient was being prepared for cardioversion. He had been on Xarelto for one month. I saw him in the office in finalize the plan on October 07, 2011. Cardioversion was to have been done today. However the patient developed hematuria on August 10. When he called I made a decision at that time to stop his Xarelto and cancel his cardioversion. On August 11 he had persistent hematuria but today it is improved. For now we will not plan to go ahead with cardioversion until he has had urology consultation. I discussed this with his primary physician Dr.Norins who will arrange for urology consultation. I called and spoke with the patient about this and he understands and agrees.

## 2011-10-12 ENCOUNTER — Other Ambulatory Visit: Payer: Self-pay | Admitting: Internal Medicine

## 2011-10-12 ENCOUNTER — Encounter (HOSPITAL_COMMUNITY)
Admission: RE | Admit: 2011-10-12 | Discharge: 2011-10-12 | Disposition: A | Discharge status: Home / Self Care | Payer: Medicare Other | Source: Ambulatory Visit | Attending: Emergency Medicine | Admitting: Emergency Medicine

## 2011-10-12 DIAGNOSIS — R319 Hematuria, unspecified: Secondary | ICD-10-CM

## 2011-10-12 NOTE — Telephone Encounter (Signed)
noted 

## 2011-10-14 ENCOUNTER — Encounter (HOSPITAL_COMMUNITY)
Admission: RE | Admit: 2011-10-14 | Discharge: 2011-10-14 | Disposition: A | Discharge status: Home / Self Care | Payer: Medicare Other | Source: Ambulatory Visit | Attending: Emergency Medicine | Admitting: Emergency Medicine

## 2011-10-19 ENCOUNTER — Encounter (HOSPITAL_COMMUNITY)
Admission: RE | Admit: 2011-10-19 | Discharge: 2011-10-19 | Disposition: A | Discharge status: Home / Self Care | Payer: Medicare Other | Source: Ambulatory Visit | Attending: Emergency Medicine | Admitting: Emergency Medicine

## 2011-10-20 ENCOUNTER — Other Ambulatory Visit: Payer: Self-pay | Admitting: Internal Medicine

## 2011-10-21 ENCOUNTER — Encounter (HOSPITAL_COMMUNITY)
Admission: RE | Admit: 2011-10-21 | Discharge: 2011-10-21 | Disposition: A | Discharge status: Home / Self Care | Payer: Medicare Other | Source: Ambulatory Visit | Attending: Emergency Medicine | Admitting: Emergency Medicine

## 2011-10-22 ENCOUNTER — Ambulatory Visit (INDEPENDENT_AMBULATORY_CARE_PROVIDER_SITE_OTHER): Payer: Medicare Other | Admitting: Emergency Medicine

## 2011-10-22 ENCOUNTER — Encounter: Payer: Self-pay | Admitting: Emergency Medicine

## 2011-10-22 VITALS — BP 122/62 | HR 72 | Temp 97.5°F | Ht 74.0 in | Wt 301.0 lb

## 2011-10-22 DIAGNOSIS — G4733 Obstructive sleep apnea (adult) (pediatric): Secondary | ICD-10-CM

## 2011-10-22 DIAGNOSIS — J449 Chronic obstructive pulmonary disease, unspecified: Secondary | ICD-10-CM

## 2011-10-22 NOTE — Progress Notes (Signed)
71 yo former smoker, hx obesity, DM, HTN, OSA on CPAP 8. Also hx gallstone pancreatitis s/p chole in the past. Dx with COPD by pulmonologist in [place] many yrs ago. Has been on combivent, more recently Spiriva + ProAir. He doesn't take the Spiriva daily, doesn't see that it helps him. he does use ProAir, feels it helps him clear sputum. Also was followed for pulm nodule, he reports was stable on serial CTs over 5 yrs.  Able to walk around the house, but has to stop to rest if he walks over a block, climbs stairs. His last PFTs were done 6 -7 mo ago in [place]. Good compliance w CPAP, pressure never rechecked since '97.   ROV 02/11/10 -- follows up today for OSA/OHS, COPD. Last time we stopped Spiriva because he was only using as needed. He occas uses ProAir (about qod), helps him clear phlegm. Auto-titration CPAP study performed and shows that optimal CPAP is 14cmH2O (currently set on 8). CT scans available for review dating back to 2005 - There has been very little change up to last one done in 11/01/08: In fact it looks smaller to me (1.6x1.5cm from 1.8x1.5).   ROV 08/25/10 -- OSA/OHS, COPD. He is tolerating CPAP 14cmH2O. He is doing well, using ProAir prn - uses it when he is congested, very rare. He gets SOB with exertion, moves slowly,able to do projects around the house, work in the yard. He is happy w the breathing meds right now.   ROV 03/25/11 --  OSA/OHS, COPD. Regular f/u. We tried Symbicort last time to see if he would benefit. He took it for a week and then stopped. He didn't notice much difference. He uses ProAir but rarely. He is still having chest congestion, daily phlegm.   ROV 05/03/11 --  OSA/OHS, COPD. Has not benefited in past from long-acting meds, but last time we restarted Symbicort to see if it would help. He believes that his cough and chest congestion are both better. Still with significant exertional SOB. Good compliance w CPAP.   ROV 06/21/11 -- OSA/OHS, COPD. He had TTE since last time to  screen for PAH > shows some LAE, normal RV size and fxn (05/13/11). He remains on CPAP. He is using symbicort, probably some improvement.   ROV 10/22/11 -- OSA/OHS, COPD. He is doing CPAP, has benefited from pulm rehab, has lost 10 lbs. He is on Symbicort, but stopped doing it regularly. He does have SABA but doesn't use regularly.   Filed Vitals:   10/22/11 1342  BP: 122/62  Pulse: 72  Temp: 97.5 F (36.4 C)   Gen: Pleasant, obese man, in no distress,  normal affect  ENT: No lesions,  mouth clear,  oropharynx clear, no postnasal drip  Neck: No JVD, no TMG, no carotid bruits  Lungs: No use of accessory muscles, no dullness to percussion, clear without rales or rhonchi  Cardiovascular: RRR, heart sounds normal, no murmur or gallops, no peripheral edema  Abdomen: obese, non-tender  Musculoskeletal: No deformities, no cyanosis or clubbing  Neuro: alert, non focal  Skin: Warm, no lesions or rashes   OBSTRUCTIVE SLEEP APNEA Continue CPAP, needs to have it checked by [facility].   COPD Stop symbicort Continue the albuterol prn [email or web address]

## 2011-10-22 NOTE — Assessment & Plan Note (Signed)
Stop symbicort Continue the albuterol prn [email or web address]

## 2011-10-22 NOTE — Patient Instructions (Addendum)
Continue your exercise regimen Stop Symbicort Use your albuterol 2 puffs as needed for your breathing Wear your CPAP every night. We will ask [facility] to service your machine and mask Follow with Dr Delton Coombes in 4 months or sooner if you have any problems.

## 2011-10-22 NOTE — Assessment & Plan Note (Signed)
Continue CPAP, needs to have it checked by [facility].

## 2011-10-26 ENCOUNTER — Encounter (HOSPITAL_COMMUNITY)
Admission: RE | Admit: 2011-10-26 | Discharge: 2011-10-26 | Disposition: A | Discharge status: Home / Self Care | Payer: Medicare Other | Source: Ambulatory Visit | Attending: Emergency Medicine | Admitting: Emergency Medicine

## 2011-10-26 NOTE — Progress Notes (Signed)
Zalen Picado 71 y.o. male Nutrition Note Spoke with pt. Pt is obese.  Pt wt is down 18 lb over the past 6 weeks. Rate of wt loss greater than desired rate. Pt reports intentionally trying to lose wt by decreasing portion sizes consumed. Pt eats 2 meals a day; most prepared at home.  Pt eats out or picks food up from a restaurant 2 times a week. There are some ways the pt can make his eating habits healthier.  Pt's Rate Your Plate results reviewed with pt.  Pt expressed understanding.  Pt does not avoid salty food; uses canned food.  Pt does not add salt to food.  The role of sodium in lung disease reviewed with pt.  Pt is diabetic.  Pt checks CBG's 3 times a day.  CBG's reportedly around 105 mg/dL before meals. Before bed, pt reports his CBG's range from 140-150 mg/dL. Pt states he has not had an A1c check in 2 years due to "moving to [place]." Pt's last A1c 7.2 per pt. Pre-exercise CBG's have been 97-145 mg/dL and post-exercise CBG's have been 87-194 mg/dL; most CBG's above desired goal.  Pt unable to recall recommended CBG ranges before meals and 1-2 hours after meals.  Recommended CBG ranges reviewed with pt.  Pt has attended part of a Diabetes [employer] class. Per nutrition screen, pt c/o constipation. Pt has increased his consumption of water and has added fruit to his diet by using a Vita mix. Pt encouraged to eat fresh fruit, which pt replied "I won't eat fresh fruit. I always buy fruit and it goes bad." Pt expressed understanding of information reviewed. Nutrition Diagnosis   Excessive sodium intake related to over consumption of processed food as evidenced by frequent consumption of convenience food/ canned vegetables and eating out frequently.   Food-and nutrition-related knowledge deficit related to lack of exposure to information as related to diagnosis of pulmonary disease   Obesity related to excessive energy intake as evidenced by a BMI of 42.8 Nutrition Rx/Est. Daily Nutrition Needs for: ? wt  loss 2200-2450 Kcal  120-145 gm protein   1500-2000 mg or less sodium     325 gm CHO Nutrition Intervention   Pt's individual nutrition plan and goals reviewed with pt.   Benefits of adopting healthy eating habits discussed when pt's Rate Your Plate reviewed.   Pt to attend the Nutrition and Lung Disease class   Continual client-centered nutrition education by RD, as part of interdisciplinary care. Goal(s) 1. Pt to identify and limit food sources of sodium. 2. Identify food quantities necessary to achieve wt loss of  -2# per week to a goal wt of 136.3-141.7 kg (300-312 lb) at graduation from pulmonary rehab. 3. Describe the benefit of including fruits, vegetables, whole grains, and low-fat dairy products in a healthy meal plan. Monitor and Evaluate progress toward nutrition goal with team. Nutrition Risk: change to Moderate Risk

## 2011-10-27 ENCOUNTER — Telehealth: Payer: Self-pay | Admitting: *Deleted

## 2011-10-27 NOTE — Telephone Encounter (Signed)
Patient states his metor he is using to check blood sugar is  Called TRUE Results

## 2011-10-28 ENCOUNTER — Encounter (HOSPITAL_COMMUNITY)
Admission: RE | Admit: 2011-10-28 | Discharge: 2011-10-28 | Disposition: A | Discharge status: Home / Self Care | Payer: Medicare Other | Source: Ambulatory Visit | Attending: Emergency Medicine | Admitting: Emergency Medicine

## 2011-11-02 ENCOUNTER — Encounter (HOSPITAL_COMMUNITY): Payer: Medicare Other

## 2011-11-04 ENCOUNTER — Encounter (HOSPITAL_COMMUNITY): Payer: Medicare Other

## 2011-11-09 ENCOUNTER — Encounter (HOSPITAL_COMMUNITY): Payer: Medicare Other

## 2011-11-11 ENCOUNTER — Encounter (HOSPITAL_COMMUNITY): Payer: Medicare Other

## 2011-11-16 ENCOUNTER — Encounter (HOSPITAL_COMMUNITY)
Admission: RE | Admit: 2011-11-16 | Discharge: 2011-11-16 | Disposition: A | Discharge status: Home / Self Care | Payer: Medicare Other | Source: Ambulatory Visit | Attending: Emergency Medicine | Admitting: Emergency Medicine

## 2011-11-16 DIAGNOSIS — Z5189 Encounter for other specified aftercare: Secondary | ICD-10-CM | POA: Insufficient documentation

## 2011-11-16 DIAGNOSIS — J4489 Other specified chronic obstructive pulmonary disease: Secondary | ICD-10-CM | POA: Insufficient documentation

## 2011-11-16 DIAGNOSIS — J449 Chronic obstructive pulmonary disease, unspecified: Secondary | ICD-10-CM | POA: Insufficient documentation

## 2011-11-16 DIAGNOSIS — G4733 Obstructive sleep apnea (adult) (pediatric): Secondary | ICD-10-CM | POA: Insufficient documentation

## 2011-11-18 ENCOUNTER — Encounter (HOSPITAL_COMMUNITY)
Admission: RE | Admit: 2011-11-18 | Discharge: 2011-11-18 | Disposition: A | Discharge status: Home / Self Care | Payer: Medicare Other | Source: Ambulatory Visit | Attending: Emergency Medicine | Admitting: Emergency Medicine

## 2011-11-23 ENCOUNTER — Encounter (HOSPITAL_COMMUNITY)
Admission: RE | Admit: 2011-11-23 | Discharge: 2011-11-23 | Disposition: A | Discharge status: Home / Self Care | Payer: Medicare Other | Source: Ambulatory Visit | Attending: Emergency Medicine | Admitting: Emergency Medicine

## 2011-11-25 ENCOUNTER — Encounter (HOSPITAL_COMMUNITY)
Admission: RE | Admit: 2011-11-25 | Discharge: 2011-11-25 | Disposition: A | Discharge status: Home / Self Care | Payer: Medicare Other | Source: Ambulatory Visit | Attending: Emergency Medicine | Admitting: Emergency Medicine

## 2011-11-26 ENCOUNTER — Telehealth: Payer: Self-pay | Admitting: Emergency Medicine

## 2011-11-26 NOTE — Telephone Encounter (Signed)
LMTCB

## 2011-11-29 ENCOUNTER — Ambulatory Visit (INDEPENDENT_AMBULATORY_CARE_PROVIDER_SITE_OTHER): Payer: Medicare Other | Admitting: Internal Medicine

## 2011-11-29 ENCOUNTER — Other Ambulatory Visit (INDEPENDENT_AMBULATORY_CARE_PROVIDER_SITE_OTHER): Payer: Medicare Other

## 2011-11-29 VITALS — BP 112/50 | HR 66 | Temp 97.5°F | Resp 16 | Wt 317.0 lb

## 2011-11-29 DIAGNOSIS — R319 Hematuria, unspecified: Secondary | ICD-10-CM

## 2011-11-29 DIAGNOSIS — E119 Type 2 diabetes mellitus without complications: Secondary | ICD-10-CM

## 2011-11-29 DIAGNOSIS — I4891 Unspecified atrial fibrillation: Secondary | ICD-10-CM

## 2011-11-29 DIAGNOSIS — I10 Essential (primary) hypertension: Secondary | ICD-10-CM

## 2011-11-29 DIAGNOSIS — E785 Hyperlipidemia, unspecified: Secondary | ICD-10-CM

## 2011-11-29 DIAGNOSIS — Z23 Encounter for immunization: Secondary | ICD-10-CM | POA: Insufficient documentation

## 2011-11-29 DIAGNOSIS — Z0000 Encounter for general adult medical examination without abnormal findings: Secondary | ICD-10-CM

## 2011-11-29 LAB — LIPID PANEL
HDL: 34.7 mg/dL — ABNORMAL LOW (ref >39.00)
LDL Cholesterol: 82 mg/dL (ref 0–99)
Total CHOL/HDL Ratio: 4
VLDL: 23.8 mg/dL (ref 0.0–40.0)

## 2011-11-29 LAB — HEMOGLOBIN A1C: Hgb A1c MFr Bld: 7.6 % — ABNORMAL HIGH (ref 4.6–6.5)

## 2011-11-29 NOTE — Telephone Encounter (Signed)
Yes saw it today and signed, thanks.

## 2011-11-29 NOTE — Progress Notes (Signed)
Subjective:    Patient ID: Charles [name], male    DOB: 03-Dec-1940, 71 y.o.   MRN: [id number]  HPI The patient is here for annual Medicare wellness examination and management of other chronic and acute problems. In the main he has been stable and feeling pretty good.   Mr. Reiland has been seeing Dr. Delton Coombes - he is off inhalers but is doing well with pulmonary rehab which he says makes him feel better.  He was to have DC cardioversion but developed gross hematuria so this procedure was cancelled. He is to see Urology in the next several days to complete that evaluation.   An appointment is scheduled for him to see Dr. Danella Deis for follow-up of rosacea.   He had a root canal done in late September - good relief of dental pain.    The risk factors are reflected in the social history.  The roster of all physicians providing medical care to patient - is listed in the Snapshot section of the chart.  Activities of daily living:  The patient is 100% inedpendent in all ADLs: dressing, toileting, feeding as well as independent mobility  Home safety : The patient has smoke detectors in the home.Fall - fall safe environment. They wear seatbelts. No firearms at home   There is no risks for hepatitis, STDs or HIV. There is no   history of blood transfusion. They have no travel history to infectious disease endemic areas of the world.  The patient has seen their dentist in the last six month. They have seen their eye doctor in the last year. They admit to hearing difficulty and have not had audiologic testing in the last year.    They do not  have excessive sun exposure. Discussed the need for sun protection: hats, long sleeves and use of sunscreen if there is significant sun exposure.   Diet: the importance of a healthy diet is discussed. They do have a less than healthy diet.  The patient has a regular exercise program: pulmonary rehab , 45 min duration, 2 days per week.  The benefits of  regular aerobic exercise were discussed.They could relate day,date,year and events; recalled  Depression screen: there are no signs or vegative symptoms of depression- irritability, change in appetite, anhedonia, sadness/tearfullness.  Cognitive assessment: the patient manages all their financial and personal affairs and is actively engaged.   Vision, hearing, body mass index were assessed and reviewed.   During the course of the visit the patient was educated and counseled about appropriate screening and preventive services including : fall prevention , diabetes screening, nutrition counseling, colorectal cancer screening, and recommended immunizations.  Past Medical History  Diagnosis Date  . Morbid obesity   . Diverticulosis of colon   . Acute pancreatitis     Gallstone pancreatitis  . COPD (chronic obstructive pulmonary disease)   . OSA (obstructive sleep apnea)     Dr. Delton Coombes  . CAD (coronary artery disease)   . Cataract   . Nonexudative senile macular degeneration of retina   . Solitary pulmonary nodule     Dr. Delton Coombes  Followed by serial CTs  . Hyperlipidemia   . Osteoarthritis   . Anemia   . Diabetes mellitus, type 2   . Asthma   . Hypertension   . Stroke   1996     Remote past  . Ejection fraction     EF 55-60%, echo, March, 2013, Good RV function  . Aortic stenosis     Mild,  echo, March, 2013,   . Atrial fibrillation     New diagnosis August 11, 2011  . DOE (dyspnea on exertion)     Dr. Delton Coombes,  obesity and deconditioning  playing a major role. No definite pulmonary hypertension on echo.  . Edema     Probably from venous insufficiency   Past Surgical History  Procedure Date  . Cholecystectomy   . Tonsillectomy    Family History  Problem Relation Age of Onset  . Cancer Father     metastatic  . Heart failure Father   . Heart disease Mother   . Heart attack Mother   . Hypertension Mother   . Diabetes Mother    History   Social History  . Marital Status:  Single    Spouse Name: N/A    Number of Children: N/A  . Years of Education: N/A   Occupational History  . retired     Camera operator. professional musician for 12 years.    Social History Main Topics  . Smoking status: Former Smoker -- 3.0 packs/day for 35 years    Types: Cigarettes    Quit date: 03/01/1990  . Smokeless tobacco: Never Used  . Alcohol Use: Yes  . Drug Use: No  . Sexually Active: Not on file   Other Topics Concern  . 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     Current Outpatient Prescriptions on File Prior to Visit  Medication Sig Dispense Refill  . Aflibercept (EYLEA) 2 MG/0.05ML SOLN Injection every 12 weeks      . albuterol (PROAIR HFA) 108 (90 BASE) MCG/ACT inhaler Inhale 2 puffs into the lungs every 6 (six) hours as needed.  18 g  11  . atenolol (TENORMIN) 50 MG tablet Take 0.5 tablets (25 mg total) by mouth daily.  90 tablet  3  . B-D INS SYRINGE 0.5CC/30GX1/2" 30G X 1/2" 0.5 ML MISC USE AS DIRECTED.  200 each  PRN  . budesonide-formoterol (SYMBICORT) 160-4.5 MCG/ACT inhaler Inhale 2 puffs into the lungs 2 (two) times daily.  1 Inhaler  12  . doxycycline (VIBRAMYCIN) 100 MG capsule Take 100 mg by mouth 1 day or 1 dose. As needed for rosacea      . hydrochlorothiazide (HYDRODIURIL) 25 MG tablet TAKE 1 TABLET ONCE DAILY.  90 tablet  3  . lisinopril (PRINIVIL,ZESTRIL) 20 MG tablet TAKE 1 TABLET ONCE DAILY.  90 tablet  3  . metFORMIN (GLUCOPHAGE) 500 MG tablet TAKE 1 TABLET IN THE MORNING AND 2 TABLETS IN THE EVENING.  270 tablet  3  . NOVOLIN N 100 UNIT/ML injection 15 units every morning and 20 to 30 units every evening      . NOVOLIN R 100 UNIT/ML injection INJECT 15-20 UNITS TWICE DAILY BEFORE BREAKFAST AND DINNER,AND 5-15 UNITS AT BEDTIME.  30 mL  3  . Omega-3 Fatty Acids (FISH OIL) 1000 MG CAPS Take 1  capsule by mouth daily.        . ONE TOUCH ULTRA TEST test strip CHECK BLOOD SUGAR 3 TIMES A DAY.  100 each  11  . simvastatin (ZOCOR) 40 MG tablet TAKE 1 TABLET IN THE P.M.  90 tablet  3  . Sulfacetamide Sodium 10 % SUSP Apply topically daily as directed          Review of Systems Constitutional:  Negative for fever, chills, activity change and  unexpected weight change.  HEENT:  Negative for hearing loss, ear pain, congestion, neck stiffness and postnasal drip. Negative for sore throat or swallowing problems. Negative for dental complaints.   Eyes: Negative for vision loss or change in visual acuity.  Respiratory: Negative for chest tightness and wheezing. Negative for DOE.   Cardiovascular: Negative for chest pain or palpitations. No decreased exercise tolerance Gastrointestinal: No change in bowel habit. No bloating or gas. No reflux or indigestion Genitourinary: Negative for urgency, frequency, flank pain and difficulty urinating.  Musculoskeletal: Negative for myalgias, back pain and gait problem. Right shoulder pain - ?rotator cuff. Did have acupuncture with good results. Neurological: Negative for dizziness, tremors, weakness and headaches.  Hematological: Negative for adenopathy.  Psychiatric/Behavioral: Negative for behavioral problems and dysphoric mood.       Objective:   Physical Exam Filed Vitals:   11/29/11 1429  BP: 112/50  Pulse: 66  Temp: 97.5 F (36.4 C)  Resp: 16   Wt Readings from Last 3 Encounters:  11/29/11 317 lb (143.79 kg)  10/22/11 301 lb (136.533 kg)  10/07/11 316 lb (143.337 kg)   Gen'l - obese white man in no distress HEENT - EAC/TM normal, oropharynx normal, C&S clear Neck -supple Nodes - negative Cor - RRR Pulm - normal respirations Abd - obese Ext - no deformity Neuro - A&O x 3, CN II-XII normal, normal gait.       Assessment & Plan:

## 2011-11-29 NOTE — Assessment & Plan Note (Signed)
Had single episode - over more than a day - while on Xarelto. Sept '13  Plan -  He remains off anticoagulation at this time  Has appointment with Dr. Mena Goes, Urology

## 2011-11-29 NOTE — Assessment & Plan Note (Signed)
Followed by Dr. Myrtis Ser. He had ablation cancelled due to hematuria.  Today he seems to be in sinus rhythm on exam.  Plan - per Dr. Myrtis Ser.

## 2011-11-29 NOTE — Assessment & Plan Note (Signed)
BP Readings from Last 3 Encounters:  11/29/11 112/50  10/22/11 122/62  10/07/11 110/60   Very good control.

## 2011-11-29 NOTE — Assessment & Plan Note (Signed)
LDL 82 - close to goal of 80 or less. HDL 35  Plan - continue zocor 40 mg

## 2011-11-29 NOTE — Telephone Encounter (Signed)
Charles [name], spoke with [name].  States they faxed over a rx for cpap supplies on 9/19 and 9/27 for RB to sign.  Checking on the status of this.  RB, have you seen this form?  Pls advise. Thank you.

## 2011-11-29 NOTE — Telephone Encounter (Signed)
Returned call to [place] from [place] to update on status of form.  No answer, left message on machine that paperwork has been signed given to [facility] and should be faxed back.

## 2011-11-29 NOTE — Patient Instructions (Addendum)
General exam - need to loose weight. This is a major threat to your health. If you don't think you can loose weight you may want to inquire at [facility] about bariatric surgery. Locally the cut off age is 6.  Will check cholesterol levels and A1C today and results will be sent to you or available through MyChart

## 2011-11-29 NOTE — Assessment & Plan Note (Signed)
Lab Results  Component Value Date   HGBA1C 7.6* 11/29/2011   suboptimal control  On Humulin N and Humlin R along with metformin but below threshold of 8% for major change in medication.  Plan - continue present regimen  Improve life-style management: strict diet - no sugar and very low carbs, needs exercise.  Recheck A1C 3 months (order entered)

## 2011-11-29 NOTE — Assessment & Plan Note (Signed)
Very complex medical history - unresolved A. Fib, unresolved hematuria plus many active medical problems. His limited physical was ok. Labs reveal good cholesterol control, suboptimal DM control. See problem based charting. No record of colonoscopy in EPIC - will ask patient for more information. Immunizations are up to date and Rx provided for shingles vaccine under his Part D coverage.   In summary - a nice man with multiple medical problems who seems stable. Obesity is a major health problem and he should consider Bariatric consultation, although the local [facility] program usually is reluctant with patients over 65, plus his multiple co-morbities.  He will return in 3 months for follow-up.

## 2011-11-29 NOTE — Assessment & Plan Note (Signed)
Major problem. May want to consider bariatric surgery - he can contact [facility] given his age.

## 2011-11-30 ENCOUNTER — Encounter (HOSPITAL_COMMUNITY)
Admission: RE | Admit: 2011-11-30 | Discharge: 2011-11-30 | Disposition: A | Discharge status: Home / Self Care | Payer: Medicare Other | Source: Ambulatory Visit | Attending: Emergency Medicine | Admitting: Emergency Medicine

## 2011-11-30 ENCOUNTER — Encounter (HOSPITAL_COMMUNITY): Payer: Medicare Other

## 2011-11-30 DIAGNOSIS — Z5189 Encounter for other specified aftercare: Secondary | ICD-10-CM | POA: Insufficient documentation

## 2011-11-30 DIAGNOSIS — J449 Chronic obstructive pulmonary disease, unspecified: Secondary | ICD-10-CM | POA: Insufficient documentation

## 2011-11-30 DIAGNOSIS — G4733 Obstructive sleep apnea (adult) (pediatric): Secondary | ICD-10-CM | POA: Insufficient documentation

## 2011-11-30 DIAGNOSIS — J4489 Other specified chronic obstructive pulmonary disease: Secondary | ICD-10-CM | POA: Insufficient documentation

## 2011-11-30 NOTE — Telephone Encounter (Signed)
LMOM TCB for [name] again informing her that the paperwork has been given to [facility] and faxed.  Will sign off.

## 2011-12-02 ENCOUNTER — Encounter (HOSPITAL_COMMUNITY)
Admission: RE | Admit: 2011-12-02 | Discharge: 2011-12-02 | Disposition: A | Discharge status: Home / Self Care | Payer: Medicare Other | Source: Ambulatory Visit | Attending: Emergency Medicine | Admitting: Emergency Medicine

## 2011-12-07 ENCOUNTER — Other Ambulatory Visit: Payer: Self-pay | Admitting: *Deleted

## 2011-12-07 ENCOUNTER — Encounter (HOSPITAL_COMMUNITY)
Admission: RE | Admit: 2011-12-07 | Discharge: 2011-12-07 | Disposition: A | Discharge status: Home / Self Care | Payer: Medicare Other | Source: Ambulatory Visit | Attending: Emergency Medicine | Admitting: Emergency Medicine

## 2011-12-07 MED ORDER — GLUCOSE BLOOD VI STRP: ORAL_STRIP | Status: DC

## 2011-12-09 ENCOUNTER — Encounter (HOSPITAL_COMMUNITY)
Admission: RE | Admit: 2011-12-09 | Discharge: 2011-12-09 | Disposition: A | Discharge status: Home / Self Care | Payer: Medicare Other | Source: Ambulatory Visit | Attending: Emergency Medicine | Admitting: Emergency Medicine

## 2011-12-14 ENCOUNTER — Encounter (HOSPITAL_COMMUNITY)
Admission: RE | Admit: 2011-12-14 | Discharge: 2011-12-14 | Disposition: A | Discharge status: Home / Self Care | Payer: Medicare Other | Source: Ambulatory Visit | Attending: Emergency Medicine | Admitting: Emergency Medicine

## 2011-12-14 NOTE — Progress Notes (Addendum)
Pulmonary Rehabilitation Program Outcomes Report   Orientation:  08/11/2011 Graduate Date:  12/14/2011 Discharge Date:  12/14/2011 # of sessions completed: 24  Pulmonologist: Byrum  Class Time:  1330  A.  Exercise Program:  Tolerates exercise @ 2.0 METS for 45 minutes, Walk Test Results:  Pre: 525 ft and Post: 625 ft, Improved functional capacity  19.05 %, Decreased  muscular strength  -15.38 %, Improved dyspnea score 46.30 %, Improved education score 16.67 %, Patient plans to continue pulmonary rehab maintenance program, Discharged to home exercise program.  Anticipated compliance:  good and Discharged  B.  Mental Health:  Quality of Life (QOL)  changes:  Overall  +26.98 %, Health/Functioning +24.38 %, Socioeconomics -5.97 %, Psych/Spiritual +9.14 %, Family +29.00 %    C.  Education/Instruction/Skills  Uses Perceived Exertion Scale and/or Dyspnea Scale and Attended 10 education classes  Demonstrates accurate diaphragmatic breathing and pursed lip breathing. Home exercise given: 09/30/2011  D.  Nutrition/Weight Control/Body Composition:  There is some room for improvement for pt diet to become healthier.  Pt wt down 4 kg.  Wt loss desired. BMI 41.6, % body fat 39.3%. Pt with 3%  decrease in % body fat. Section Completed by: Mickle Plumb, M.Ed, RD, LDN, CDE    E.  Blood Lipids   Lab Results  Component Value Date   CHOL 140 11/29/2011   HDL 34.70* 11/29/2011   LDLCALC 82 11/29/2011   TRIG 119.0 11/29/2011   CHOLHDL 4 11/29/2011    F.  Lifestyle Changes:  Making positive lifestyle changes  G.  Symptoms noted with exercise:  Shortness of breath, Fatigue and Exertional hypertension  Report Completed By:  Frederik Schmidt. Allred, MS, NASM, CES   Comments:  Patient has graduated our undergrad pulmonary rehab program and did well. Patient completed all program and personal goals. Patient increased workloads as he could handle per session. Patient very satisfied with  the outcome of program. As shown above patient showed improvements in all aspects of rehab. Patient plans to come to the pulmonary maintenance program to continue his exercise. Mr. Eimers was very compliant with his attendance as well as his home exercise and it showed during class. Please refer to the top for all program outcomes.  Great client to work with. He had a smile on everyday and worked hard everyday.  Courtney L. Allred, MS, NASM, CES    Agree with the above note.  Cathie Olden RN

## 2011-12-16 ENCOUNTER — Encounter (HOSPITAL_COMMUNITY): Payer: Medicare Other

## 2011-12-17 ENCOUNTER — Telehealth: Payer: Self-pay | Admitting: Emergency Medicine

## 2011-12-17 NOTE — Telephone Encounter (Signed)
Called, spoke with pt who states Dr. Mena Goes ordered a CT abd approx 1 wk ago.  Was advised they seen a nodule on lung.  Pt informed them he was aware of having a lung nodule but they want pt to follow up with Dr. Delton Coombes for this.  States they were going to send these results.  Pt would like RB to look at this advise.  He is aware RB off until Monday and ok with this.  Pls advise.  Thank you.

## 2011-12-21 ENCOUNTER — Encounter (HOSPITAL_COMMUNITY): Payer: Medicare Other

## 2011-12-22 ENCOUNTER — Encounter (HOSPITAL_COMMUNITY)
Admission: RE | Admit: 2011-12-22 | Discharge: 2011-12-22 | Disposition: A | Discharge status: Home / Self Care | Payer: Medicare Other | Source: Ambulatory Visit | Attending: Emergency Medicine | Admitting: Emergency Medicine

## 2011-12-23 NOTE — Telephone Encounter (Signed)
Do we have this CT scan?

## 2011-12-24 ENCOUNTER — Encounter (HOSPITAL_COMMUNITY)
Admission: RE | Admit: 2011-12-24 | Discharge: 2011-12-24 | Disposition: A | Discharge status: Home / Self Care | Payer: Medicare Other | Source: Ambulatory Visit | Attending: Emergency Medicine | Admitting: Emergency Medicine

## 2011-12-24 NOTE — Telephone Encounter (Signed)
I have not seen these results for patient.  I have called Dr. Estil Daft office with no answer.  WCB for them to fax CT results.

## 2011-12-27 ENCOUNTER — Encounter (HOSPITAL_COMMUNITY)
Admission: RE | Admit: 2011-12-27 | Discharge: 2011-12-27 | Disposition: A | Discharge status: Home / Self Care | Payer: Medicare Other | Source: Ambulatory Visit | Attending: Emergency Medicine | Admitting: Emergency Medicine

## 2011-12-28 NOTE — Telephone Encounter (Signed)
Spoke with medical records at Dr. Greer Ee office, apparently CT images/results were sent to another office.  They are now faxing over results. Will place in Dr. Kavin Leech Look at.

## 2011-12-29 ENCOUNTER — Encounter (HOSPITAL_COMMUNITY)
Admission: RE | Admit: 2011-12-29 | Discharge: 2011-12-29 | Disposition: A | Discharge status: Home / Self Care | Payer: Medicare Other | Source: Ambulatory Visit | Attending: Emergency Medicine | Admitting: Emergency Medicine

## 2011-12-31 ENCOUNTER — Encounter (HOSPITAL_COMMUNITY)
Admission: RE | Admit: 2011-12-31 | Discharge: 2011-12-31 | Disposition: A | Discharge status: Home / Self Care | Payer: Self-pay | Source: Ambulatory Visit | Attending: Emergency Medicine | Admitting: Emergency Medicine

## 2011-12-31 DIAGNOSIS — G4733 Obstructive sleep apnea (adult) (pediatric): Secondary | ICD-10-CM | POA: Insufficient documentation

## 2011-12-31 DIAGNOSIS — Z5189 Encounter for other specified aftercare: Secondary | ICD-10-CM | POA: Insufficient documentation

## 2011-12-31 DIAGNOSIS — J4489 Other specified chronic obstructive pulmonary disease: Secondary | ICD-10-CM | POA: Insufficient documentation

## 2011-12-31 DIAGNOSIS — J449 Chronic obstructive pulmonary disease, unspecified: Secondary | ICD-10-CM | POA: Insufficient documentation

## 2011-12-31 NOTE — Telephone Encounter (Signed)
Nodule measures 1.3x1.6cm, actually slightly smaller than documented measurements from CT scans of the chest in our system from 2005 and 2010. I don't think there is any intervention needed at this time.

## 2011-12-31 NOTE — Progress Notes (Signed)
Charles [name] checked his CBG with his own meter CBG 63.  Charles [name] took his own glucose tablets. Patient given a Tang. Repeat CBG 90.  Charles [name] ate a banana .  Patient complained of feeling sweaty after exercise..  Repeat blood sugar 127. Charles [name] left pulmonary maintenance without complaints. Dr Debby Bud office called and notiofed. Will continue to monitor the patient throughout  the program.

## 2012-01-01 ENCOUNTER — Other Ambulatory Visit: Payer: Self-pay | Admitting: Internal Medicine

## 2012-01-03 ENCOUNTER — Encounter (HOSPITAL_COMMUNITY): Payer: Self-pay

## 2012-01-05 ENCOUNTER — Encounter (HOSPITAL_COMMUNITY)
Admission: RE | Admit: 2012-01-05 | Discharge: 2012-01-05 | Disposition: A | Discharge status: Home / Self Care | Payer: Self-pay | Source: Ambulatory Visit | Attending: Emergency Medicine | Admitting: Emergency Medicine

## 2012-01-06 ENCOUNTER — Telehealth: Payer: Self-pay | Admitting: Internal Medicine

## 2012-01-06 NOTE — Telephone Encounter (Signed)
Message copied by Newell Coral on Thu Jan 06, 2012  9:42 AM ------      Message from: Jacques Navy      Created: Sat Jan 01, 2012  6:20 PM      Regarding: RE: hypoglycemia at pulmonary rehab       Cutting back on sliding scale before exercise is a good idea. Will contact patient for office visit.       ----- Message -----         From: Thayer Headings, RN         Sent: 12/31/2011   3:11 PM           To: Jackey Loge, RN, Jacques Navy, MD      Subject: hypoglycemia at pulmonary rehab                          Dr Debby Bud,            I just want to let you know that Alfio's blood sugar was 63 post exercise today.  Mourad complained of feeling sweaty other wise he was asymptomatic.  Recheck blood sugars were 90 then 127.  Zenas took his metformin 2 tablets.  20 units of Novolin N and 15 units of Novolin R.  Raiford said he would cut back on his Sliding  scale insulin  For his next exercise session if necessary at pulmonary maintenance.            Thanks for your input!            Gladstone Lighter RN

## 2012-01-06 NOTE — Telephone Encounter (Signed)
Called pt several times this week, no answer, left voice mail msgs on home answering machine.

## 2012-01-07 ENCOUNTER — Encounter (HOSPITAL_COMMUNITY)
Admission: RE | Admit: 2012-01-07 | Discharge: 2012-01-07 | Disposition: A | Discharge status: Home / Self Care | Payer: Self-pay | Source: Ambulatory Visit | Attending: Emergency Medicine | Admitting: Emergency Medicine

## 2012-01-10 ENCOUNTER — Encounter (HOSPITAL_COMMUNITY)
Admission: RE | Admit: 2012-01-10 | Discharge: 2012-01-10 | Disposition: A | Discharge status: Home / Self Care | Payer: Self-pay | Source: Ambulatory Visit | Attending: Emergency Medicine | Admitting: Emergency Medicine

## 2012-01-12 ENCOUNTER — Encounter (HOSPITAL_COMMUNITY)
Admission: RE | Admit: 2012-01-12 | Discharge: 2012-01-12 | Disposition: A | Discharge status: Home / Self Care | Payer: Self-pay | Source: Ambulatory Visit | Attending: Emergency Medicine | Admitting: Emergency Medicine

## 2012-01-14 ENCOUNTER — Encounter (HOSPITAL_COMMUNITY)
Admission: RE | Admit: 2012-01-14 | Discharge: 2012-01-14 | Disposition: A | Discharge status: Home / Self Care | Payer: Self-pay | Source: Ambulatory Visit | Attending: Emergency Medicine | Admitting: Emergency Medicine

## 2012-01-17 ENCOUNTER — Encounter (HOSPITAL_COMMUNITY)
Admission: RE | Admit: 2012-01-17 | Discharge: 2012-01-17 | Disposition: A | Discharge status: Home / Self Care | Payer: Self-pay | Source: Ambulatory Visit | Attending: Emergency Medicine | Admitting: Emergency Medicine

## 2012-01-18 ENCOUNTER — Ambulatory Visit (INDEPENDENT_AMBULATORY_CARE_PROVIDER_SITE_OTHER): Payer: Medicare Other | Admitting: Internal Medicine

## 2012-01-18 ENCOUNTER — Encounter: Payer: Self-pay | Admitting: Internal Medicine

## 2012-01-18 VITALS — BP 112/70 | HR 66 | Temp 97.5°F | Resp 12 | Wt 315.0 lb

## 2012-01-18 DIAGNOSIS — I10 Essential (primary) hypertension: Secondary | ICD-10-CM

## 2012-01-18 DIAGNOSIS — J449 Chronic obstructive pulmonary disease, unspecified: Secondary | ICD-10-CM

## 2012-01-18 DIAGNOSIS — E119 Type 2 diabetes mellitus without complications: Secondary | ICD-10-CM

## 2012-01-18 DIAGNOSIS — E785 Hyperlipidemia, unspecified: Secondary | ICD-10-CM

## 2012-01-18 MED ORDER — METFORMIN HCL 1000 MG PO TABS: 1000.0000 mg | ORAL_TABLET | Freq: Two times a day (BID) | ORAL | Status: DC

## 2012-01-18 NOTE — Patient Instructions (Addendum)
Hematuria - blood in the urine. Full evaluation with Dr. Mena Goes - no problems revealed.  Atrial fibrillation - still in atrial fibrillation but rate controlled. It is time to see Dr. Myrtis Ser about continuing treatment  Cholesterol is very well controlled and due for repeat in January.  Diabetes - last A1C 7.6 % in September, next due in January. Keep the Novolin N at a steady dose. Take the Novolin R before meals - it is rapid acting and meant to manage the glucose load with a meal. Reduce the dose on your exercise days. Increase the metformin to 1000 mg twice a day.

## 2012-01-19 ENCOUNTER — Encounter (HOSPITAL_COMMUNITY)
Admission: RE | Admit: 2012-01-19 | Discharge: 2012-01-19 | Disposition: A | Discharge status: Home / Self Care | Payer: Self-pay | Source: Ambulatory Visit | Attending: Emergency Medicine | Admitting: Emergency Medicine

## 2012-01-21 ENCOUNTER — Encounter (HOSPITAL_COMMUNITY)
Admission: RE | Admit: 2012-01-21 | Discharge: 2012-01-21 | Disposition: A | Discharge status: Home / Self Care | Payer: Self-pay | Source: Ambulatory Visit | Attending: Emergency Medicine | Admitting: Emergency Medicine

## 2012-01-21 NOTE — Assessment & Plan Note (Signed)
Patient with significant obesity which contributes to his chronic medical problems - metabolic syndrome. Addressing his weight is a crucial issue.  Plan - diet management: Smart food choices, PORTION SIZE CONTROL, regular exercise - working up to 30 minutes 3 times a week of aerobic exercise, e.g. Walking, etc, 3 times a week.   Target weight 250 lbs.  Goal - to loose 2 lbs a month (3 year project)

## 2012-01-21 NOTE — Assessment & Plan Note (Signed)
BP Readings from Last 3 Encounters:  01/18/12 112/70  11/29/11 112/50  10/22/11 122/62   Good control on present regimen

## 2012-01-21 NOTE — Progress Notes (Signed)
Subjective:    Patient ID: Charles [name], male    DOB: 11/06/1940, 71 y.o.   MRN: [id number]  HPI Charles [name] presents for follow up of diabetes as well as other medical problems. In the interval since Charles [name] last IM visit he has been followed by Dr. Myrtis Ser for a. Fib; he has had a full GU work up for gross hematuria. He reports fluctuations in Charles [name] blood sugars. He is currently taking Novolin R before breakfast and before bed and adjust Charles [name] Novolin NPH based on Charles [name] CBG readings. Last A1C Sept '13 7.6%.  Also discussed weight management.  Past Medical History  Diagnosis Date  . Morbid obesity   . Diverticulosis of colon   . Acute pancreatitis     Gallstone pancreatitis  . COPD (chronic obstructive pulmonary disease)   . OSA (obstructive sleep apnea)     Dr. Delton Coombes  . CAD (coronary artery disease)   . Cataract   . Nonexudative senile macular degeneration of retina   . Solitary pulmonary nodule     Dr. Delton Coombes  Followed by serial CTs  . Hyperlipidemia   . Osteoarthritis   . Anemia   . Diabetes mellitus, type 2   . Asthma   . Hypertension   . Stroke   1996     Remote past  . Ejection fraction     EF 55-60%, echo, March, 2013, Good RV function  . Aortic stenosis     Mild, echo, March, 2013,   . Atrial fibrillation     New diagnosis August 11, 2011  . DOE (dyspnea on exertion)     Dr. Delton Coombes,  obesity and deconditioning  playing a major role. No definite pulmonary hypertension on echo.  . Edema     Probably from venous insufficiency   Past Surgical History  Procedure Date  . Cholecystectomy   . Tonsillectomy    Family History  Problem Relation Age of Onset  . Cancer Father     metastatic  . Heart failure Father   . Heart disease Mother   . Heart attack Mother   . Hypertension Mother   . Diabetes Mother    History   Social History  . Marital Status: Single    Spouse Name: N/A    Number of Children: N/A  . Years of Education: N/A   Occupational History  . retired      Camera operator. professional musician for 12 years.    Social History Main Topics  . Smoking status: Former Smoker -- 3.0 packs/day for 35 years    Types: Cigarettes    Quit date: 03/01/1990  . Smokeless tobacco: Never Used  . Alcohol Use: Yes  . Drug Use: No  . Sexually Active: Not on file   Other Topics Concern  . 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     Current Outpatient Prescriptions on File Prior to Visit  Medication Sig Dispense Refill  . Aflibercept (EYLEA) 2 MG/0.05ML SOLN Injection every 12 weeks      . albuterol (PROAIR HFA) 108 (90 BASE) MCG/ACT inhaler Inhale 2 puffs into the lungs every 6 (six) hours as needed.  18 g  11  . atenolol (TENORMIN) 50 MG tablet Take 0.5 tablets (25 mg total) by mouth daily.  90 tablet  3  . B-D INS SYRINGE 0.5CC/30GX1/2" 30G X 1/2" 0.5 ML MISC USE AS DIRECTED.  200 each  PRN  . doxycycline (VIBRAMYCIN) 100 MG capsule Take 100 mg by mouth 1 day or 1 dose. As needed for rosacea      . glucose blood (ONE TOUCH ULTRA TEST) test strip ONE TOUCH ULTRA TEST STRIPSUse as instructed, TEST BLOOD SUGAR TID, WITH LANCETS AND LANCET DEVICE. DIAGNOSIS CODE 250.0  100 each  10  . hydrochlorothiazide (HYDRODIURIL) 25 MG tablet TAKE 1 TABLET ONCE DAILY.  90 tablet  3  . lisinopril (PRINIVIL,ZESTRIL) 20 MG tablet TAKE 1 TABLET ONCE DAILY.  90 tablet  3  . metFORMIN (GLUCOPHAGE) 1000 MG tablet Take 1 tablet (1,000 mg total) by mouth 2 (two) times daily with a meal.  180 tablet  3  . NOVOLIN N 100 UNIT/ML injection 15 units every morning and 20 to 30 units every evening      . NOVOLIN R 100 UNIT/ML injection INJECT 15-20 UNITS TWICE DAILY BEFORE BREAKFAST AND DINNER,AND 5-15 UNITS AT BEDTIME.  30 mL  11  . Omega-3 Fatty Acids (FISH OIL) 1000 MG CAPS Take 1 capsule by mouth daily.        . simvastatin  (ZOCOR) 40 MG tablet TAKE 1 TABLET IN THE P.M.  90 tablet  3  . Sulfacetamide Sodium 10 % SUSP Apply topically daily as directed      . amoxicillin (AMOXIL) 500 MG capsule Take 500 mg by mouth 4 (four) times daily.      . budesonide-formoterol (SYMBICORT) 160-4.5 MCG/ACT inhaler Inhale 2 puffs into the lungs 2 (two) times daily.  1 Inhaler  12      Review of Systems System review is negative for any constitutional, cardiac, pulmonary, GI or neuro symptoms or complaints other than as described in the HPI.     Objective:   Physical Exam Filed Vitals:   01/18/12 1458  BP: 112/70  Pulse: 66  Temp: 97.5 F (36.4 C)  Resp: 12   Wt Readings from Last 3 Encounters:  01/18/12 315 lb (142.883 kg)  11/29/11 317 lb (143.79 kg)  10/22/11 301 lb (136.533 kg)   Gen'l- obese white man in no distress HEENT -  PERRLA Cor- IRIR rate controlled Pulm - no increased WOB Neuro - A& x 3  Lab Results  Component Value Date   WBC 8.3 10/07/2011   HGB 14.4 10/07/2011   HCT 44.5 10/07/2011   PLT 217.0 10/07/2011   GLUCOSE 191* 10/07/2011   CHOL 140 11/29/2011   TRIG 119.0 11/29/2011   HDL 34.70* 11/29/2011   LDLCALC 82 11/29/2011   ALT 17 08/18/2011   AST 30 08/18/2011   NA 138 10/07/2011   K 3.7 10/07/2011   CL 99 10/07/2011   CREATININE 1.1 10/07/2011   BUN 20 10/07/2011   CO2 29 10/07/2011   TSH 1.30 08/18/2011   INR 1.5* 10/07/2011   HGBA1C 7.6* 11/29/2011          Assessment & Plan:

## 2012-01-21 NOTE — Assessment & Plan Note (Signed)
Has noticed real benefit from pulmonary rehab and is continuing twice a week on his own plus going to the gym

## 2012-01-21 NOTE — Assessment & Plan Note (Addendum)
Charles [name] in the past used Lantus solostar and Novolog pen but due to cost he has been using R and NPH. Discussed his regimen in detail: NPH as basal insulin and R before meals!  Plan  Novolin N - 20 units AM and Bedtime  Novolin R - before each meal: 15, 15, 20. On exercise days - reduce before breakfast dose to 10 units  Metformin - increase to 1,000 mg twice a day  Monitoring - fasting glucose; 90 minutes after meals - keep record to guide adjustment of before meal units of R

## 2012-01-21 NOTE — Assessment & Plan Note (Signed)
Last LDL 82 - well within goal of 100 or less, and close to optimal of <80  Plan Continue present medications

## 2012-01-24 ENCOUNTER — Encounter (HOSPITAL_COMMUNITY)
Admission: RE | Admit: 2012-01-24 | Discharge: 2012-01-24 | Disposition: A | Discharge status: Home / Self Care | Payer: Self-pay | Source: Ambulatory Visit | Attending: Emergency Medicine | Admitting: Emergency Medicine

## 2012-01-26 ENCOUNTER — Encounter (HOSPITAL_COMMUNITY): Payer: Self-pay

## 2012-01-28 ENCOUNTER — Encounter (HOSPITAL_COMMUNITY): Payer: Self-pay

## 2012-01-31 ENCOUNTER — Encounter (HOSPITAL_COMMUNITY)
Admission: RE | Admit: 2012-01-31 | Discharge: 2012-01-31 | Disposition: A | Discharge status: Home / Self Care | Payer: Self-pay | Source: Ambulatory Visit | Attending: Emergency Medicine | Admitting: Emergency Medicine

## 2012-01-31 DIAGNOSIS — Z5189 Encounter for other specified aftercare: Secondary | ICD-10-CM | POA: Insufficient documentation

## 2012-01-31 DIAGNOSIS — J449 Chronic obstructive pulmonary disease, unspecified: Secondary | ICD-10-CM | POA: Insufficient documentation

## 2012-01-31 DIAGNOSIS — G4733 Obstructive sleep apnea (adult) (pediatric): Secondary | ICD-10-CM | POA: Insufficient documentation

## 2012-01-31 DIAGNOSIS — J4489 Other specified chronic obstructive pulmonary disease: Secondary | ICD-10-CM | POA: Insufficient documentation

## 2012-02-02 ENCOUNTER — Encounter (HOSPITAL_COMMUNITY)
Admission: RE | Admit: 2012-02-02 | Discharge: 2012-02-02 | Disposition: A | Discharge status: Home / Self Care | Payer: Self-pay | Source: Ambulatory Visit | Attending: Emergency Medicine | Admitting: Emergency Medicine

## 2012-02-04 ENCOUNTER — Encounter (HOSPITAL_COMMUNITY)
Admission: RE | Admit: 2012-02-04 | Discharge: 2012-02-04 | Disposition: A | Discharge status: Home / Self Care | Payer: Self-pay | Source: Ambulatory Visit | Attending: Emergency Medicine | Admitting: Emergency Medicine

## 2012-02-04 ENCOUNTER — Other Ambulatory Visit: Payer: Self-pay | Admitting: Internal Medicine

## 2012-02-07 ENCOUNTER — Other Ambulatory Visit: Payer: Self-pay | Admitting: Internal Medicine

## 2012-02-07 ENCOUNTER — Encounter (HOSPITAL_COMMUNITY)
Admission: RE | Admit: 2012-02-07 | Discharge: 2012-02-07 | Disposition: A | Discharge status: Home / Self Care | Payer: Self-pay | Source: Ambulatory Visit | Attending: Emergency Medicine | Admitting: Emergency Medicine

## 2012-02-08 ENCOUNTER — Other Ambulatory Visit: Payer: Self-pay | Admitting: *Deleted

## 2012-02-08 MED ORDER — LISINOPRIL 20 MG PO TABS: 20.0000 mg | ORAL_TABLET | Freq: Every day | ORAL | Status: DC

## 2012-02-09 ENCOUNTER — Encounter (HOSPITAL_COMMUNITY)
Admission: RE | Admit: 2012-02-09 | Discharge: 2012-02-09 | Disposition: A | Discharge status: Home / Self Care | Payer: Self-pay | Source: Ambulatory Visit | Attending: Emergency Medicine | Admitting: Emergency Medicine

## 2012-02-11 ENCOUNTER — Encounter (HOSPITAL_COMMUNITY)
Admission: RE | Admit: 2012-02-11 | Discharge: 2012-02-11 | Disposition: A | Discharge status: Home / Self Care | Payer: Self-pay | Source: Ambulatory Visit | Attending: Emergency Medicine | Admitting: Emergency Medicine

## 2012-02-14 ENCOUNTER — Encounter (HOSPITAL_COMMUNITY)
Admission: RE | Admit: 2012-02-14 | Discharge: 2012-02-14 | Disposition: A | Discharge status: Home / Self Care | Payer: Self-pay | Source: Ambulatory Visit | Attending: Emergency Medicine | Admitting: Emergency Medicine

## 2012-02-16 ENCOUNTER — Encounter (HOSPITAL_COMMUNITY): Payer: Self-pay

## 2012-02-17 ENCOUNTER — Ambulatory Visit (INDEPENDENT_AMBULATORY_CARE_PROVIDER_SITE_OTHER): Payer: Medicare Other | Admitting: Internal Medicine

## 2012-02-17 ENCOUNTER — Encounter: Payer: Self-pay | Admitting: Internal Medicine

## 2012-02-17 ENCOUNTER — Other Ambulatory Visit (INDEPENDENT_AMBULATORY_CARE_PROVIDER_SITE_OTHER): Payer: Medicare Other

## 2012-02-17 VITALS — BP 118/58 | HR 56 | Temp 97.7°F | Resp 12 | Wt 308.0 lb

## 2012-02-17 DIAGNOSIS — E119 Type 2 diabetes mellitus without complications: Secondary | ICD-10-CM

## 2012-02-17 MED ORDER — INSULIN NPH (HUMAN) (ISOPHANE) 100 UNIT/ML ~~LOC~~ SUSP: 20.0000 [IU] | Freq: Two times a day (BID) | SUBCUTANEOUS | Status: DC

## 2012-02-17 MED ORDER — INSULIN REGULAR HUMAN 100 UNIT/ML IJ SOLN: 15.0000 [IU] | Freq: Two times a day (BID) | INTRAMUSCULAR | Status: DC | PRN

## 2012-02-17 NOTE — Patient Instructions (Addendum)
Better blood sugar control on your present regimen - good work.   Plan -  A1C to check the trend.  Continue what you are doing  Work on the weight.

## 2012-02-17 NOTE — Progress Notes (Signed)
  Subjective:    Patient ID: Charles [name], male    DOB: April 08, 1940, 71 y.o.   MRN: [id number]  HPI Charles [name] presents for follow up of DM. He has been doing better re: control taking NPH 20 u AM and Supper; R 20 u AM, 15 u Supper. He has been watching his diet and exercising more. He has not had hypoglycemia and feels better  PMH, FamHx and SocHx reviewed for any changes and relevance.  Current Outpatient Prescriptions on File Prior to Visit  Medication Sig Dispense Refill  . Aflibercept (EYLEA) 2 MG/0.05ML SOLN Injection every 12 weeks      . albuterol (PROAIR HFA) 108 (90 BASE) MCG/ACT inhaler Inhale 2 puffs into the lungs every 6 (six) hours as needed.  18 g  11  . atenolol (TENORMIN) 50 MG tablet Take 0.5 tablets (25 mg total) by mouth daily.  90 tablet  3  . B-D INS SYRINGE 0.5CC/30GX1/2" 30G X 1/2" 0.5 ML MISC USE AS DIRECTED.  200 each  PRN  . doxycycline (VIBRAMYCIN) 100 MG capsule Take 100 mg by mouth 1 day or 1 dose. As needed for rosacea      . glucose blood (ONE TOUCH ULTRA TEST) test strip ONE TOUCH ULTRA TEST STRIPSUse as instructed, TEST BLOOD SUGAR TID, WITH LANCETS AND LANCET DEVICE. DIAGNOSIS CODE 250.0  100 each  10  . hydrochlorothiazide (HYDRODIURIL) 25 MG tablet TAKE 1 TABLET ONCE DAILY.  90 tablet  3  . insulin NPH (NOVOLIN N) 100 UNIT/ML injection Inject 20 Units into the skin 2 (two) times daily before a meal.  1 vial    . insulin regular (NOVOLIN R) 100 units/mL injection Inject 0.15 mLs (15 Units total) into the skin 2 (two) times daily as needed for high blood sugar. Take 15 u AM and 20 u supper  30 mL  11  . lisinopril (PRINIVIL,ZESTRIL) 20 MG tablet Take 1 tablet (20 mg total) by mouth daily.  90 tablet  2  . metFORMIN (GLUCOPHAGE) 1000 MG tablet Take 1 tablet (1,000 mg total) by mouth 2 (two) times daily with a meal.  180 tablet  3  . Omega-3 Fatty Acids (FISH OIL) 1000 MG CAPS Take 1 capsule by mouth daily.        . simvastatin (ZOCOR) 40 MG tablet TAKE  1 TABLET IN THE P.M.  90 tablet  3  . Sulfacetamide Sodium 10 % SUSP Apply topically daily as directed      . ZOSTAVAX 78295 UNT/0.65ML injection       . amoxicillin (AMOXIL) 500 MG capsule Take 500 mg by mouth 4 (four) times daily.      . budesonide-formoterol (SYMBICORT) 160-4.5 MCG/ACT inhaler Inhale 2 puffs into the lungs 2 (two) times daily.  1 Inhaler  12      Review of Systems System review is negative for any constitutional, cardiac, pulmonary, GI or neuro symptoms or complaints other than as described in the HPI.     Objective:   Physical Exam Filed Vitals:   02/17/12 1315  BP: 118/58  Pulse: 56  Temp: 97.7 F (36.5 C)  Resp: 12   Gen'l- obese white man in no distress Cor- RRR Pulm - no increased WOB       Assessment & Plan:

## 2012-02-18 ENCOUNTER — Encounter (HOSPITAL_COMMUNITY)
Admission: RE | Admit: 2012-02-18 | Discharge: 2012-02-18 | Disposition: A | Discharge status: Home / Self Care | Payer: Self-pay | Source: Ambulatory Visit | Attending: Emergency Medicine | Admitting: Emergency Medicine

## 2012-02-21 ENCOUNTER — Encounter (HOSPITAL_COMMUNITY): Payer: Self-pay

## 2012-02-21 NOTE — Assessment & Plan Note (Signed)
On modified regimen he reports better control and less hypoglycemia  Plan - for A1C -  Addendum - remains unchanged at 7.8 %  Plan - continue present regimen.

## 2012-02-25 ENCOUNTER — Encounter (HOSPITAL_COMMUNITY): Payer: Self-pay

## 2012-02-25 ENCOUNTER — Other Ambulatory Visit: Payer: Self-pay | Admitting: Internal Medicine

## 2012-02-25 DIAGNOSIS — E119 Type 2 diabetes mellitus without complications: Secondary | ICD-10-CM

## 2012-02-28 ENCOUNTER — Encounter (HOSPITAL_COMMUNITY)
Admission: RE | Admit: 2012-02-28 | Discharge: 2012-02-28 | Disposition: A | Discharge status: Home / Self Care | Payer: Self-pay | Source: Ambulatory Visit | Attending: Emergency Medicine | Admitting: Emergency Medicine

## 2012-03-01 ENCOUNTER — Encounter (HOSPITAL_COMMUNITY): Payer: Self-pay

## 2012-03-03 ENCOUNTER — Encounter (HOSPITAL_COMMUNITY)
Admission: RE | Admit: 2012-03-03 | Discharge: 2012-03-03 | Disposition: A | Discharge status: Home / Self Care | Payer: Self-pay | Source: Ambulatory Visit | Attending: Emergency Medicine | Admitting: Emergency Medicine

## 2012-03-03 DIAGNOSIS — Z5189 Encounter for other specified aftercare: Secondary | ICD-10-CM | POA: Insufficient documentation

## 2012-03-03 DIAGNOSIS — J449 Chronic obstructive pulmonary disease, unspecified: Secondary | ICD-10-CM | POA: Insufficient documentation

## 2012-03-03 DIAGNOSIS — J4489 Other specified chronic obstructive pulmonary disease: Secondary | ICD-10-CM | POA: Insufficient documentation

## 2012-03-03 DIAGNOSIS — G4733 Obstructive sleep apnea (adult) (pediatric): Secondary | ICD-10-CM | POA: Insufficient documentation

## 2012-03-06 ENCOUNTER — Encounter (HOSPITAL_COMMUNITY): Payer: Self-pay

## 2012-03-08 ENCOUNTER — Encounter (HOSPITAL_COMMUNITY): Payer: Self-pay

## 2012-03-10 ENCOUNTER — Encounter (HOSPITAL_COMMUNITY)
Admission: RE | Admit: 2012-03-10 | Discharge: 2012-03-10 | Disposition: A | Discharge status: Home / Self Care | Payer: Self-pay | Source: Ambulatory Visit | Attending: Emergency Medicine | Admitting: Emergency Medicine

## 2012-03-13 ENCOUNTER — Encounter (HOSPITAL_COMMUNITY)
Admission: RE | Admit: 2012-03-13 | Discharge: 2012-03-13 | Disposition: A | Discharge status: Home / Self Care | Payer: Self-pay | Source: Ambulatory Visit | Attending: Emergency Medicine | Admitting: Emergency Medicine

## 2012-03-15 ENCOUNTER — Other Ambulatory Visit: Payer: Self-pay | Admitting: Internal Medicine

## 2012-03-15 ENCOUNTER — Encounter (HOSPITAL_COMMUNITY): Payer: Self-pay

## 2012-03-17 ENCOUNTER — Encounter (HOSPITAL_COMMUNITY)
Admission: RE | Admit: 2012-03-17 | Discharge: 2012-03-17 | Disposition: A | Discharge status: Home / Self Care | Payer: Self-pay | Source: Ambulatory Visit | Attending: Emergency Medicine | Admitting: Emergency Medicine

## 2012-03-20 ENCOUNTER — Encounter (HOSPITAL_COMMUNITY): Payer: Self-pay

## 2012-03-22 ENCOUNTER — Encounter (HOSPITAL_COMMUNITY): Payer: Self-pay

## 2012-03-24 ENCOUNTER — Encounter (HOSPITAL_COMMUNITY): Payer: Self-pay

## 2012-03-27 ENCOUNTER — Encounter (HOSPITAL_COMMUNITY): Payer: Self-pay

## 2012-03-29 ENCOUNTER — Encounter (HOSPITAL_COMMUNITY): Payer: Self-pay

## 2012-03-31 ENCOUNTER — Encounter (HOSPITAL_COMMUNITY): Payer: Self-pay

## 2012-04-03 ENCOUNTER — Encounter (HOSPITAL_COMMUNITY): Payer: Medicare Other

## 2012-04-05 ENCOUNTER — Encounter (HOSPITAL_COMMUNITY): Payer: Medicare Other

## 2012-04-07 ENCOUNTER — Encounter (HOSPITAL_COMMUNITY): Payer: Medicare Other

## 2012-04-10 ENCOUNTER — Encounter (HOSPITAL_COMMUNITY): Payer: Medicare Other

## 2012-04-12 ENCOUNTER — Encounter (HOSPITAL_COMMUNITY): Payer: Medicare Other

## 2012-04-14 ENCOUNTER — Encounter (HOSPITAL_COMMUNITY): Payer: Medicare Other

## 2012-04-17 ENCOUNTER — Encounter (HOSPITAL_COMMUNITY): Payer: Medicare Other

## 2012-04-18 ENCOUNTER — Other Ambulatory Visit: Payer: Self-pay | Admitting: Internal Medicine

## 2012-04-19 ENCOUNTER — Encounter (HOSPITAL_COMMUNITY): Payer: Medicare Other

## 2012-04-21 ENCOUNTER — Encounter (HOSPITAL_COMMUNITY): Payer: Medicare Other

## 2012-04-24 ENCOUNTER — Ambulatory Visit (HOSPITAL_COMMUNITY): Payer: Medicare Other

## 2012-04-26 ENCOUNTER — Ambulatory Visit (HOSPITAL_COMMUNITY): Payer: Medicare Other

## 2012-04-28 ENCOUNTER — Ambulatory Visit (HOSPITAL_COMMUNITY): Payer: Medicare Other

## 2012-05-01 ENCOUNTER — Ambulatory Visit (HOSPITAL_COMMUNITY): Payer: Medicare Other

## 2012-05-03 ENCOUNTER — Ambulatory Visit (HOSPITAL_COMMUNITY): Payer: Medicare Other

## 2012-05-05 ENCOUNTER — Ambulatory Visit (HOSPITAL_COMMUNITY): Payer: Medicare Other

## 2012-05-08 ENCOUNTER — Ambulatory Visit (HOSPITAL_COMMUNITY): Payer: Medicare Other

## 2012-05-10 ENCOUNTER — Ambulatory Visit (HOSPITAL_COMMUNITY): Payer: Medicare Other

## 2012-05-12 ENCOUNTER — Ambulatory Visit (HOSPITAL_COMMUNITY): Payer: Medicare Other

## 2012-05-15 ENCOUNTER — Ambulatory Visit (HOSPITAL_COMMUNITY): Payer: Medicare Other

## 2012-05-17 ENCOUNTER — Ambulatory Visit (HOSPITAL_COMMUNITY): Payer: Medicare Other

## 2012-05-18 ENCOUNTER — Other Ambulatory Visit: Payer: Self-pay | Admitting: Internal Medicine

## 2012-05-19 ENCOUNTER — Ambulatory Visit (HOSPITAL_COMMUNITY): Payer: Medicare Other

## 2012-05-22 ENCOUNTER — Ambulatory Visit (HOSPITAL_COMMUNITY): Payer: Medicare Other

## 2012-05-24 ENCOUNTER — Ambulatory Visit (HOSPITAL_COMMUNITY): Payer: Medicare Other

## 2012-05-26 ENCOUNTER — Ambulatory Visit (HOSPITAL_COMMUNITY): Payer: Medicare Other

## 2012-05-29 ENCOUNTER — Ambulatory Visit (HOSPITAL_COMMUNITY): Payer: Medicare Other

## 2012-05-31 ENCOUNTER — Ambulatory Visit (HOSPITAL_COMMUNITY): Payer: Medicare Other

## 2012-06-02 ENCOUNTER — Ambulatory Visit (HOSPITAL_COMMUNITY): Payer: Medicare Other

## 2012-06-05 ENCOUNTER — Ambulatory Visit (HOSPITAL_COMMUNITY): Payer: Medicare Other

## 2012-06-07 ENCOUNTER — Ambulatory Visit (HOSPITAL_COMMUNITY): Payer: Medicare Other

## 2012-06-09 ENCOUNTER — Ambulatory Visit (HOSPITAL_COMMUNITY): Payer: Medicare Other

## 2012-06-12 ENCOUNTER — Ambulatory Visit (HOSPITAL_COMMUNITY): Payer: Medicare Other

## 2012-06-14 ENCOUNTER — Ambulatory Visit (HOSPITAL_COMMUNITY): Payer: Medicare Other

## 2012-06-16 ENCOUNTER — Ambulatory Visit (HOSPITAL_COMMUNITY): Payer: Medicare Other

## 2012-06-19 ENCOUNTER — Ambulatory Visit (HOSPITAL_COMMUNITY): Payer: Medicare Other

## 2012-06-21 ENCOUNTER — Ambulatory Visit (HOSPITAL_COMMUNITY): Payer: Medicare Other

## 2012-06-23 ENCOUNTER — Ambulatory Visit (HOSPITAL_COMMUNITY): Payer: Medicare Other

## 2012-06-26 ENCOUNTER — Ambulatory Visit (HOSPITAL_COMMUNITY): Payer: Medicare Other

## 2012-06-28 ENCOUNTER — Ambulatory Visit (HOSPITAL_COMMUNITY): Payer: Medicare Other

## 2012-06-30 ENCOUNTER — Ambulatory Visit (HOSPITAL_COMMUNITY): Payer: Medicare Other

## 2012-07-03 ENCOUNTER — Ambulatory Visit (HOSPITAL_COMMUNITY): Payer: Medicare Other

## 2012-07-13 ENCOUNTER — Other Ambulatory Visit: Payer: Self-pay

## 2012-07-13 MED ORDER — ATENOLOL 50 MG PO TABS: 50.0000 mg | ORAL_TABLET | Freq: Every day | ORAL | Status: DC

## 2012-08-28 ENCOUNTER — Telehealth: Payer: Self-pay | Admitting: Internal Medicine

## 2012-08-28 NOTE — Telephone Encounter (Signed)
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  He may have a UTI.  He has congestion.  His oxygen level dropped to 85 last night.  That is better today.

## 2012-08-28 NOTE — Telephone Encounter (Signed)
OK 

## 2012-08-29 NOTE — Telephone Encounter (Signed)
Spoke to the patient.  He is feeling better.  He made an appt for July 9.

## 2012-08-29 NOTE — Telephone Encounter (Signed)
LMOM to call for an appt. °

## 2012-09-06 ENCOUNTER — Encounter: Payer: Self-pay | Admitting: Internal Medicine

## 2012-09-06 ENCOUNTER — Ambulatory Visit (INDEPENDENT_AMBULATORY_CARE_PROVIDER_SITE_OTHER): Payer: Medicare Other | Admitting: Internal Medicine

## 2012-09-06 VITALS — BP 116/50 | HR 55 | Temp 97.3°F | Resp 16 | Wt 301.8 lb

## 2012-09-06 DIAGNOSIS — I2510 Atherosclerotic heart disease of native coronary artery without angina pectoris: Secondary | ICD-10-CM

## 2012-09-06 DIAGNOSIS — E785 Hyperlipidemia, unspecified: Secondary | ICD-10-CM

## 2012-09-06 DIAGNOSIS — D649 Anemia, unspecified: Secondary | ICD-10-CM

## 2012-09-06 DIAGNOSIS — J810 Acute pulmonary edema: Secondary | ICD-10-CM

## 2012-09-06 DIAGNOSIS — I10 Essential (primary) hypertension: Secondary | ICD-10-CM

## 2012-09-06 DIAGNOSIS — I4891 Unspecified atrial fibrillation: Secondary | ICD-10-CM

## 2012-09-06 DIAGNOSIS — E119 Type 2 diabetes mellitus without complications: Secondary | ICD-10-CM

## 2012-09-06 NOTE — Patient Instructions (Addendum)
1. UTI symptoms have cleared.  2. Episode of shortness of breath, low oxygen level and frothy sputum suggests pulmonary edema - fluid build up in the lungs.  Plan Repeat 2 D echo  Follow up visit with Dr. Myrtis Ser.  3. A. Fib - rate is well controlled.  Question is what about anticoagulation to prevent stroke. Xarelto doesn't cause a bleed but makes a bleed worse. Having had a GI bleed on aspirin is not a life long contra indication for the use of Aspirin. Your CHADs score was 2  With a 4% per year risk of stroke.  Plan  this is a topic for your to discuss with Dr. Myrtis Ser.

## 2012-09-06 NOTE — Progress Notes (Signed)
Subjective:    Patient ID: Charles [name], male    DOB: 1940/04/19, 72 y.o.   MRN: [id number]  HPI Mr. Keysor reports that he had increased urinary frequency and urgency similar to prior UTIs. He was on doxycycline for derm and he increased this to once a day for several days and the "UTI" symptoms improved.  He did have an episode of shortness of breath and cough productive of a foamy sputum. He checked his oxygen saturation in the early AM read as 85%. After purse lip breathing he felt better.  He has a history of A. Fib; CAD, HTN, DM. Last 2 D echo '13 with EF 50-55% and normal RV function. He does have h/o GI bleed when on ASA. He was on Xarelto but then had GU bleed but no source was identified with full GU work-up.  Past Medical History  Diagnosis Date  . Morbid obesity   . Diverticulosis of colon   . Acute pancreatitis     Gallstone pancreatitis  . COPD (chronic obstructive pulmonary disease)   . OSA (obstructive sleep apnea)     Dr. Delton Coombes  . CAD (coronary artery disease)   . Cataract   . Nonexudative senile macular degeneration of retina   . Solitary pulmonary nodule     Dr. Delton Coombes  Followed by serial CTs  . Hyperlipidemia   . Osteoarthritis   . Anemia   . Diabetes mellitus, type 2   . Asthma   . Hypertension   . Stroke   1996     Remote past  . Ejection fraction     EF 55-60%, echo, March, 2013, Good RV function  . Aortic stenosis     Mild, echo, March, 2013,   . Atrial fibrillation     New diagnosis August 11, 2011  . DOE (dyspnea on exertion)     Dr. Delton Coombes,  obesity and deconditioning  playing a major role. No definite pulmonary hypertension on echo.  . Edema     Probably from venous insufficiency   Past Surgical History  Procedure Laterality Date  . Cholecystectomy    . Tonsillectomy     Family History  Problem Relation Age of Onset  . Cancer Father     metastatic  . Heart failure Father   . Heart disease Mother   . Heart attack Mother   .  Hypertension Mother   . Diabetes Mother    History   Social History  . Marital Status: Single    Spouse Name: N/A    Number of Children: N/A  . Years of Education: N/A   Occupational History  . retired     Camera operator. professional musician for 12 years.    Social History Main Topics  . Smoking status: Former Smoker -- 3.00 packs/day for 35 years    Types: Cigarettes    Quit date: 03/01/1990  . Smokeless tobacco: Never Used  . Alcohol Use: Yes  . Drug Use: No  . Sexually Active: Not on file   Other Topics Concern  . 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     Current Outpatient Prescriptions on File Prior to Visit  Medication Sig Dispense Refill  . Aflibercept (EYLEA) 2 MG/0.05ML SOLN Injection every 12 weeks      . atenolol (TENORMIN) 50 MG tablet Take 1 tablet (50 mg  total) by mouth daily.  90 tablet  0  . B-D INS SYRINGE 0.5CC/30GX1/2" 30G X 1/2" 0.5 ML MISC USE AS DIRECTED.  200 each  PRN  . doxycycline (VIBRAMYCIN) 100 MG capsule Take 100 mg by mouth 1 day or 1 dose. As needed for rosacea      . glucose blood (ONE TOUCH ULTRA TEST) test strip ONE TOUCH ULTRA TEST STRIPSUse as instructed, TEST BLOOD SUGAR TID, WITH LANCETS AND LANCET DEVICE. DIAGNOSIS CODE 250.0  100 each  10  . hydrochlorothiazide (HYDRODIURIL) 25 MG tablet TAKE 1 TABLET ONCE DAILY.  90 tablet  3  . insulin regular (NOVOLIN R) 100 units/mL injection Inject 0.15 mLs (15 Units total) into the skin 2 (two) times daily as needed for high blood sugar. Take 15 u AM and 20 u supper  30 mL  11  . lisinopril (PRINIVIL,ZESTRIL) 20 MG tablet Take 1 tablet (20 mg total) by mouth daily.  90 tablet  2  . metFORMIN (GLUCOPHAGE) 1000 MG tablet Take 1 tablet (1,000 mg total) by mouth 2 (two) times daily with a meal.  180 tablet  3  . NOVOLIN N 100 UNIT/ML  injection INJECT 20 UNITS SUBCUTANEOUSLY IN THE MORNING AND 30 UNITS AT BEDTIME  30 mL  5  . Omega-3 Fatty Acids (FISH OIL) 1000 MG CAPS Take 1 capsule by mouth daily.        . simvastatin (ZOCOR) 40 MG tablet TAKE 1 TABLET IN THE P.M.  90 tablet  2  . Sulfacetamide Sodium 10 % SUSP Apply topically daily as directed      . ZOSTAVAX 16109 UNT/0.65ML injection       . albuterol (PROAIR HFA) 108 (90 BASE) MCG/ACT inhaler Inhale 2 puffs into the lungs every 6 (six) hours as needed.  18 g  11   No current facility-administered medications on file prior to visit.      Review of Systems System review is negative for any constitutional, cardiac, pulmonary, GI or neuro symptoms or complaints other than as described in the HPI.     Objective:   Physical Exam Filed Vitals:   09/06/12 1637  BP: 116/50  Pulse: 55  Temp: 97.3 F (36.3 C)  Resp: 16   Wt Readings from Last 3 Encounters:  09/06/12 301 lb 12.8 oz (136.896 kg)  02/17/12 308 lb 0.6 oz (139.726 kg)  01/18/12 315 lb (142.883 kg)   Gen'l - overweight, obese white man in no distress with stable vital signs.        Assessment & Plan:

## 2012-09-09 NOTE — Assessment & Plan Note (Signed)
Lab Results  Component Value Date   HGBA1C 7.8* 02/17/2012   Due for follow up A1C with recommendations to follow.

## 2012-09-09 NOTE — Assessment & Plan Note (Signed)
Episode of shortness of breath, low oxygen level and frothy sputum suggests pulmonary edema - fluid build up in the lungs.  Plan Repeat 2 D echo  Follow up visit with Dr. Myrtis Ser.

## 2012-09-09 NOTE — Assessment & Plan Note (Signed)
A. Fib - rate is well controlled.  Question is what about anticoagulation to prevent stroke. Xarelto doesn't cause a bleed but makes a bleed worse. Having had a GI bleed on aspirin is not a life long contra indication for the use of Aspirin. Your CHADs score was 2  With a 4% per year risk of stroke.  Plan Need to discuss stroke risk reduction with Dr. Myrtis Ser.

## 2012-09-12 ENCOUNTER — Ambulatory Visit (HOSPITAL_COMMUNITY): Payer: Medicare Other | Attending: Internal Medicine | Admitting: Radiology

## 2012-09-12 DIAGNOSIS — E119 Type 2 diabetes mellitus without complications: Secondary | ICD-10-CM | POA: Insufficient documentation

## 2012-09-12 DIAGNOSIS — G4733 Obstructive sleep apnea (adult) (pediatric): Secondary | ICD-10-CM | POA: Insufficient documentation

## 2012-09-12 DIAGNOSIS — I359 Nonrheumatic aortic valve disorder, unspecified: Secondary | ICD-10-CM | POA: Insufficient documentation

## 2012-09-12 DIAGNOSIS — I2510 Atherosclerotic heart disease of native coronary artery without angina pectoris: Secondary | ICD-10-CM

## 2012-09-12 DIAGNOSIS — I10 Essential (primary) hypertension: Secondary | ICD-10-CM | POA: Insufficient documentation

## 2012-09-12 DIAGNOSIS — J4489 Other specified chronic obstructive pulmonary disease: Secondary | ICD-10-CM | POA: Insufficient documentation

## 2012-09-12 DIAGNOSIS — I059 Rheumatic mitral valve disease, unspecified: Secondary | ICD-10-CM | POA: Insufficient documentation

## 2012-09-12 DIAGNOSIS — I4891 Unspecified atrial fibrillation: Secondary | ICD-10-CM

## 2012-09-12 DIAGNOSIS — J810 Acute pulmonary edema: Secondary | ICD-10-CM

## 2012-09-12 DIAGNOSIS — J449 Chronic obstructive pulmonary disease, unspecified: Secondary | ICD-10-CM | POA: Insufficient documentation

## 2012-09-12 NOTE — Progress Notes (Signed)
Echocardiogram performed.  

## 2012-10-05 ENCOUNTER — Other Ambulatory Visit (INDEPENDENT_AMBULATORY_CARE_PROVIDER_SITE_OTHER): Payer: Medicare Other

## 2012-10-05 DIAGNOSIS — E119 Type 2 diabetes mellitus without complications: Secondary | ICD-10-CM

## 2012-10-05 DIAGNOSIS — I10 Essential (primary) hypertension: Secondary | ICD-10-CM

## 2012-10-05 DIAGNOSIS — E785 Hyperlipidemia, unspecified: Secondary | ICD-10-CM

## 2012-10-05 DIAGNOSIS — D649 Anemia, unspecified: Secondary | ICD-10-CM

## 2012-10-05 LAB — COMPREHENSIVE METABOLIC PANEL
ALT: 12 U/L (ref 0–53)
AST: 28 U/L (ref 0–37)
Albumin: 3.8 g/dL (ref 3.5–5.2)
CO2: 30 mEq/L (ref 19–32)
Calcium: 9.3 mg/dL (ref 8.4–10.5)
Chloride: 102 mEq/L (ref 96–112)
Creatinine, Ser: 1.1 mg/dL (ref 0.4–1.5)
GFR: 69.59 mL/min (ref >60.00)
Potassium: 4.2 mEq/L (ref 3.5–5.1)

## 2012-10-05 LAB — HEPATIC FUNCTION PANEL
Albumin: 3.8 g/dL (ref 3.5–5.2)
Alkaline Phosphatase: 57 U/L (ref 39–117)
Bilirubin, Direct: 0.2 mg/dL (ref 0.0–0.3)

## 2012-10-05 LAB — HEMOGLOBIN AND HEMATOCRIT, BLOOD
HCT: 44.8 % (ref 39.0–52.0)
Hemoglobin: 14.7 g/dL (ref 13.0–17.0)

## 2012-10-05 LAB — LIPID PANEL
HDL: 39.6 mg/dL (ref >39.00)
Total CHOL/HDL Ratio: 4

## 2012-10-05 LAB — HEMOGLOBIN A1C: Hgb A1c MFr Bld: 8.5 % — ABNORMAL HIGH (ref 4.6–6.5)

## 2012-10-08 ENCOUNTER — Encounter: Payer: Self-pay | Admitting: Cardiology

## 2012-10-08 DIAGNOSIS — E11319 Type 2 diabetes mellitus with unspecified diabetic retinopathy without macular edema: Secondary | ICD-10-CM | POA: Insufficient documentation

## 2012-10-08 DIAGNOSIS — J449 Chronic obstructive pulmonary disease, unspecified: Secondary | ICD-10-CM | POA: Insufficient documentation

## 2012-10-08 DIAGNOSIS — E785 Hyperlipidemia, unspecified: Secondary | ICD-10-CM | POA: Insufficient documentation

## 2012-10-11 ENCOUNTER — Encounter: Payer: Self-pay | Admitting: Internal Medicine

## 2012-10-11 ENCOUNTER — Ambulatory Visit (INDEPENDENT_AMBULATORY_CARE_PROVIDER_SITE_OTHER): Payer: Medicare Other | Admitting: Internal Medicine

## 2012-10-11 VITALS — BP 138/52 | HR 49 | Temp 97.3°F | Wt 310.8 lb

## 2012-10-11 DIAGNOSIS — E785 Hyperlipidemia, unspecified: Secondary | ICD-10-CM

## 2012-10-11 DIAGNOSIS — E119 Type 2 diabetes mellitus without complications: Secondary | ICD-10-CM

## 2012-10-11 NOTE — Progress Notes (Signed)
Subjective:     Patient ID: Charles [name], male   DOB: Jul 24, 1940, 72 y.o.   MRN: [id number]  HPI [name] is a 72 year-old gentleman here to follow up his diabetes management.  He has been poorly compliant with his medications.  He states that he forgets the morning dose of metformin several times per week and commonly forgets his pre-meal insulin.  Review of Systems  HENT: Negative for hearing loss, ear pain and tinnitus.   Eyes: Negative for photophobia, discharge, redness and visual disturbance.  Respiratory: Negative for cough, choking, chest tightness and shortness of breath.        Objective:   Physical Exam  Constitutional: He appears well-developed and well-nourished. No distress.  HENT:  Head: Normocephalic.  Eyes: Conjunctivae and EOM are normal. Pupils are equal, round, and reactive to light. No scleral icterus.  Cardiovascular: Normal rate and regular rhythm.   Pulmonary/Chest: Effort normal and breath sounds normal.  Skin: He is not diaphoretic.       Assessment and Plan:

## 2012-10-11 NOTE — Patient Instructions (Addendum)
Thanks for coming in to see [place] today.  Please remember to take your metformin and insulin.  Diabetes is a serious disease.  If you have trouble remembering to take the medication, you should make a memory device (a sign or chart) that will remind you to take it.  You should get a pillbox for the metformin to help you remember to take it.  Perhaps you should put it on the breakfast table or by your toothbrush so you remember it every day.  We will re-check your A1C in three months.  You're doing a great job with the cholesterol.  Please keep up the good work on that front.

## 2012-10-11 NOTE — Assessment & Plan Note (Addendum)
Diabetes is out of control! [name] was counseled on the importance of maintaining glycemic control and the risks of uncontrolled diabetes.  He was advised on ways to remember to take his metformin and insulin.  Lab Results  Component Value Date   HGBA1C 8.5* 10/05/2012   Plan: Please remember to take medications.  We will reassess A1C level in three months and will adjust insulin as needed.

## 2012-10-11 NOTE — Assessment & Plan Note (Signed)
LDL was at goal (88, goal of 100). Plan: keep taking current medications.  We will re-check these levels next year.  Lab Results  Component Value Date   CHOL 140 10/05/2012   CHOL 140 11/29/2011   CHOL 150 01/13/2010   Lab Results  Component Value Date   HDL 39.60 10/05/2012   HDL 34.70* 11/29/2011   HDL 33.70* 01/13/2010   Lab Results  Component Value Date   LDLCALC 88 10/05/2012   LDLCALC 82 11/29/2011   LDLCALC 96 01/13/2010   Lab Results  Component Value Date   TRIG 62.0 10/05/2012   TRIG 119.0 11/29/2011   TRIG 104.0 01/13/2010   Lab Results  Component Value Date   CHOLHDL 4 10/05/2012   CHOLHDL 4 11/29/2011   CHOLHDL 4 01/13/2010   No results found for this basename: LDLDIRECT

## 2012-10-12 ENCOUNTER — Encounter: Payer: Self-pay | Admitting: Cardiology

## 2012-10-12 ENCOUNTER — Ambulatory Visit (INDEPENDENT_AMBULATORY_CARE_PROVIDER_SITE_OTHER): Payer: Medicare Other | Admitting: Cardiology

## 2012-10-12 VITALS — BP 132/66 | HR 64 | Ht 74.0 in | Wt 307.0 lb

## 2012-10-12 DIAGNOSIS — I6350 Cerebral infarction due to unspecified occlusion or stenosis of unspecified cerebral artery: Secondary | ICD-10-CM

## 2012-10-12 DIAGNOSIS — R0989 Other specified symptoms and signs involving the circulatory and respiratory systems: Secondary | ICD-10-CM

## 2012-10-12 DIAGNOSIS — R319 Hematuria, unspecified: Secondary | ICD-10-CM

## 2012-10-12 DIAGNOSIS — I340 Nonrheumatic mitral (valve) insufficiency: Secondary | ICD-10-CM

## 2012-10-12 DIAGNOSIS — I4891 Unspecified atrial fibrillation: Secondary | ICD-10-CM

## 2012-10-12 DIAGNOSIS — I059 Rheumatic mitral valve disease, unspecified: Secondary | ICD-10-CM

## 2012-10-12 DIAGNOSIS — I639 Cerebral infarction, unspecified: Secondary | ICD-10-CM

## 2012-10-12 DIAGNOSIS — I359 Nonrheumatic aortic valve disorder, unspecified: Secondary | ICD-10-CM

## 2012-10-12 DIAGNOSIS — R9430 Abnormal result of cardiovascular function study, unspecified: Secondary | ICD-10-CM

## 2012-10-12 DIAGNOSIS — I10 Essential (primary) hypertension: Secondary | ICD-10-CM

## 2012-10-12 DIAGNOSIS — I350 Nonrheumatic aortic (valve) stenosis: Secondary | ICD-10-CM

## 2012-10-12 NOTE — Patient Instructions (Addendum)
**Note De-Identified Xzayvion Vaeth Obfuscation** Your physician recommends that you continue on your current medications as directed. Please refer to the Current Medication list given to you today.  Your physician wants you to follow-up in: 6 months. You will receive a reminder letter in the mail two months in advance. If you don't receive a letter, please call our office to schedule the follow-up appointment.  Our office phone number is [phone] and Dr Myrtis Ser nurse name is Larita Fife. You can also communicate with [place] through My Chart, please see instructions given to you today to activate your account.

## 2012-10-12 NOTE — Assessment & Plan Note (Signed)
Blood pressures controlled. No change in therapy. 

## 2012-10-12 NOTE — Assessment & Plan Note (Signed)
His hematuria workup was negative in 2013.

## 2012-10-12 NOTE — Assessment & Plan Note (Signed)
The patient has good left ventricular function as documented in July, 2014 by echo.

## 2012-10-12 NOTE — Progress Notes (Signed)
HPI  The patient is seen to followup atrial fibrillation, shortness of breath, and to help discuss the issue of anticoagulation. I had seen the patient in August, 2013. He had atrial fibrillation at that time. We had hoped to proceed with cardioversion. He was started on Xarelto. Then he had some hematuria. The Xarelto was stopped. It is my understanding that he had a complete urologic evaluation that showed no significant abnormalities. There is a history of a GI bleed in the past related to aspirin and and states. I agree with Dr. Debby Bud that it is time to rediscuss the issue of possible anticoagulation.  The patient also had an episode of shortness of breath. This actually occurred during the night with some coughing. There was concern that this could possibly be pulmonary edema. The patient has not had an episode since. He had a followup two-dimensional echo showing good left or to the function.  No Known Allergies  Current Outpatient Prescriptions  Medication Sig Dispense Refill  . Aflibercept (EYLEA) 2 MG/0.05ML SOLN Injection every 12 weeks      . albuterol (PROAIR HFA) 108 (90 BASE) MCG/ACT inhaler Inhale 2 puffs into the lungs every 6 (six) hours as needed.  18 g  11  . atenolol (TENORMIN) 50 MG tablet Take 1 tablet (50 mg total) by mouth daily.  90 tablet  0  . B-D INS SYRINGE 0.5CC/30GX1/2" 30G X 1/2" 0.5 ML MISC USE AS DIRECTED.  200 each  PRN  . doxycycline (VIBRAMYCIN) 100 MG capsule Take 100 mg by mouth 1 day or 1 dose. As needed for rosacea      . glucose blood (ONE TOUCH ULTRA TEST) test strip ONE TOUCH ULTRA TEST STRIPSUse as instructed, TEST BLOOD SUGAR TID, WITH LANCETS AND LANCET DEVICE. DIAGNOSIS CODE 250.0  100 each  10  . hydrochlorothiazide (HYDRODIURIL) 25 MG tablet TAKE 1 TABLET ONCE DAILY.  90 tablet  3  . insulin regular (NOVOLIN R) 100 units/mL injection Inject 0.15 mLs (15 Units total) into the skin 2 (two) times daily as needed for high blood sugar. Take 15 u AM  and 20 u supper  30 mL  11  . lisinopril (PRINIVIL,ZESTRIL) 20 MG tablet Take 1 tablet (20 mg total) by mouth daily.  90 tablet  2  . metFORMIN (GLUCOPHAGE) 1000 MG tablet Take 1 tablet (1,000 mg total) by mouth 2 (two) times daily with a meal.  180 tablet  3  . NOVOLIN N 100 UNIT/ML injection INJECT 20 UNITS SUBCUTANEOUSLY IN THE MORNING AND 30 UNITS AT BEDTIME  30 mL  5  . Omega-3 Fatty Acids (FISH OIL) 1000 MG CAPS Take 1 capsule by mouth daily.        . simvastatin (ZOCOR) 40 MG tablet TAKE 1 TABLET IN THE P.M.  90 tablet  2  . Sulfacetamide Sodium 10 % SUSP Apply topically daily as directed       No current facility-administered medications for this visit.    History   Social History  . Marital Status: Single    Spouse Name: N/A    Number of Children: N/A  . Years of Education: N/A   Occupational History  . retired     Camera operator. professional musician for 12 years.    Social History Main Topics  . Smoking status: Former Smoker -- 3.00 packs/day for 35 years    Types: Cigarettes    Quit date: 03/01/1990  . Smokeless tobacco: Never Used  . Alcohol Use:  Yes  . Drug Use: No  . Sexual Activity: Not on file   Other Topics Concern  . 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     Family History  Problem Relation Age of Onset  . Cancer Father     metastatic  . Heart failure Father   . Heart disease Mother   . Heart attack Mother   . Hypertension Mother   . Diabetes Mother     Past Medical History  Diagnosis Date  . Morbid obesity   . Diverticulosis of colon   . Acute pancreatitis     Gallstone pancreatitis  . COPD (chronic obstructive pulmonary disease)   . OSA (obstructive sleep apnea)     Dr. Delton Coombes  . CAD (coronary artery disease)   . Cataract   . Nonexudative senile macular degeneration of retina     . Solitary pulmonary nodule     Dr. Delton Coombes  Followed by serial CTs  . Hyperlipidemia   . Osteoarthritis   . Anemia   . Diabetes mellitus, type 2   . Asthma   . Hypertension   . Stroke   1996     Remote past  . Ejection fraction     EF 55-60%, echo, March, 2013, Good RV function  . Aortic stenosis     Mild, echo, March, 2013,   . Atrial fibrillation     New diagnosis August 11, 2011  . DOE (dyspnea on exertion)     Dr. Delton Coombes,  obesity and deconditioning  playing a major role. No definite pulmonary hypertension on echo.  . Edema     Probably from venous insufficiency    Past Surgical History  Procedure Laterality Date  . Cholecystectomy    . Tonsillectomy      Patient Active Problem List   Diagnosis Date Noted  . Mitral regurgitation 10/12/2012  . COPD (chronic obstructive pulmonary disease)   . Hyperlipidemia   . Diabetes mellitus, type 2   . Need for prophylactic vaccination and inoculation against influenza 11/29/2011  . Hematuria 11/29/2011  . Routine health maintenance 11/29/2011  . Atrial fibrillation   . Edema   . Stroke   1996   . Diverticulosis of colon   . Anemia   . Hypertension   . Aortic stenosis   . Ejection fraction   . Solitary pulmonary nodule   . DOE (dyspnea on exertion) 05/03/2011  . Wart of scalp 03/25/2011  . PAROTITIS 03/19/2010  . UNSPECIFIED ANEMIA 11/24/2009  . OBSTRUCTIVE SLEEP APNEA 11/24/2009  . MACULAR DEGENERATION, SENILE, NONEXUDATIVE 11/24/2009  . UNSPECIFIED CATARACT 11/24/2009  . CAD 11/24/2009  . OSTEOARTHRITIS, MODERATE 11/24/2009  . SHOULDER PAIN, RIGHT 11/24/2009  . PANCREATITIS, ACUTE, HX OF 11/24/2009  . Obesity, Class III, BMI 40-49.9 (morbid obesity) 11/18/2009    ROS patient denies fever, chills, headache, sweats, rash, change in vision, change in hearing, chest pain, nausea vomiting, urinary symptoms. All other systems are reviewed and are negative.   PHYSICAL EXAM  Patient is overweight. He is oriented to  person time and place. Affect is normal. There is no jugulovenous distention. Lungs reveal scattered rhonchi. Cardiac exam reveals an S1-S2. There no clicks . There is a crescendo decrescendo systolic murmur.. Abdomen is soft. He has venous discoloration of the skin on his lower extremities. There is no significant edema.  Filed Vitals:  10/12/12 1444  BP: 132/66  Pulse: 64  Height: 6\' 2"  (1.88 m)  Weight: 307 lb (139.254 kg)   EKG is done today and reviewed by me. He has atrial fibrillation with a controlled ventricular response. There is no change in the QRS.  ASSESSMENT & PLAN

## 2012-10-12 NOTE — Assessment & Plan Note (Addendum)
The patient has chronic atrial fibrillation. His rate is controlled. It is possible that the diagnosis was new when I saw him in June, 2013. However he is now had it for greater than one year. He is not symptomatic with it. There is no plan to try to convert him to sinus. However anticoagulation is indicated. I have reviewed this with him at length. The patient did have some GI bleeding in the past related to aspirin and nonsteroidal medicines. He also had some hematuria after he received some Xarelto. He had a negative urologic workup. I have recommended to him that we give him another trial of Xarelto. At this point he is hesitant. He told me he will think about it. I spent an extensive amount of time discussing the benefits of anticoagulation. Ultimately the decision will be up to him.   As part of today's evaluation I spent greater than 25 minutes with the patient's total care. More than half of this time was spent discussing his atrial fibrillation with him and fully discussing anticoagulation therapy.

## 2012-10-12 NOTE — Assessment & Plan Note (Signed)
The patient had an echo in July, 2014. It is felt that there is moderate aortic stenosis. It is not severe. At this time no further workup is needed. This needs to be followed over time.

## 2012-12-12 ENCOUNTER — Other Ambulatory Visit: Payer: Self-pay | Admitting: Internal Medicine

## 2012-12-28 ENCOUNTER — Ambulatory Visit (INDEPENDENT_AMBULATORY_CARE_PROVIDER_SITE_OTHER): Payer: Medicare Other

## 2012-12-28 DIAGNOSIS — Z23 Encounter for immunization: Secondary | ICD-10-CM

## 2013-01-04 ENCOUNTER — Other Ambulatory Visit: Payer: Self-pay

## 2013-01-10 ENCOUNTER — Encounter: Payer: Self-pay | Admitting: Internal Medicine

## 2013-01-11 ENCOUNTER — Other Ambulatory Visit (INDEPENDENT_AMBULATORY_CARE_PROVIDER_SITE_OTHER): Payer: Medicare Other

## 2013-01-11 DIAGNOSIS — E119 Type 2 diabetes mellitus without complications: Secondary | ICD-10-CM

## 2013-01-11 LAB — HEMOGLOBIN A1C: Hgb A1c MFr Bld: 7.2 % — ABNORMAL HIGH (ref 4.6–6.5)

## 2013-01-12 ENCOUNTER — Other Ambulatory Visit: Payer: Self-pay | Admitting: Internal Medicine

## 2013-01-12 DIAGNOSIS — E119 Type 2 diabetes mellitus without complications: Secondary | ICD-10-CM

## 2013-02-07 ENCOUNTER — Other Ambulatory Visit: Payer: Self-pay | Admitting: Internal Medicine

## 2013-03-13 ENCOUNTER — Other Ambulatory Visit: Payer: Self-pay | Admitting: Internal Medicine

## 2013-05-01 ENCOUNTER — Other Ambulatory Visit: Payer: Self-pay | Admitting: Internal Medicine

## 2013-05-21 ENCOUNTER — Encounter: Payer: Self-pay | Admitting: Cardiology

## 2013-05-21 ENCOUNTER — Ambulatory Visit (INDEPENDENT_AMBULATORY_CARE_PROVIDER_SITE_OTHER): Payer: Medicare Other | Admitting: Cardiology

## 2013-05-21 VITALS — BP 112/52 | HR 61 | Ht 74.0 in | Wt 307.0 lb

## 2013-05-21 DIAGNOSIS — I359 Nonrheumatic aortic valve disorder, unspecified: Secondary | ICD-10-CM

## 2013-05-21 DIAGNOSIS — I350 Nonrheumatic aortic (valve) stenosis: Secondary | ICD-10-CM

## 2013-05-21 DIAGNOSIS — I4891 Unspecified atrial fibrillation: Secondary | ICD-10-CM

## 2013-05-21 NOTE — Assessment & Plan Note (Signed)
The patient has moderate aortic stenosis by echo. He needs a followup echo in one year.

## 2013-05-21 NOTE — Patient Instructions (Signed)
Your physician recommends that you continue on your current medications as directed. Please refer to the Current Medication list given to you today.  Your physician wants you to follow-up in: 1 year. You will receive a reminder letter in the mail two months in advance. If you don't receive a letter, please call our office to schedule the follow-up appointment.  

## 2013-05-21 NOTE — Progress Notes (Signed)
Patient ID: Charles RudReuben [name], male   DOB: 19-Jan-1941, 73 y.o.   MRN: [id number]    HPI  Patient is seen today to followup his atrial fibrillation and moderate aortic stenosis. I saw him last August, 2014. He is stable. His atrial fib rate is controlled and it's not bothering him. We had a long discussion about anticoagulation. He has had GI bleeding on 2 occasions in the past and he does not want to be anticoagulated.  No Known Allergies  Current Outpatient Prescriptions  Medication Sig Dispense Refill  . Aflibercept (EYLEA) 2 MG/0.05ML SOLN Injection every 12 weeks      . albuterol (PROAIR HFA) 108 (90 BASE) MCG/ACT inhaler Inhale 2 puffs into the lungs every 6 (six) hours as needed.  18 g  11  . atenolol (TENORMIN) 50 MG tablet TAKE 1 TABLET EACH DAY.  90 tablet  1  . B-D INS SYRINGE 0.5CC/30GX1/2" 30G X 1/2" 0.5 ML MISC USE AS DIRECTED.  200 each  PRN  . glucose blood (ONE TOUCH ULTRA TEST) test strip ONE TOUCH ULTRA TEST STRIPSUse as instructed, TEST BLOOD SUGAR TID, WITH LANCETS AND LANCET DEVICE. DIAGNOSIS CODE 250.0  100 each  10  . hydrochlorothiazide (HYDRODIURIL) 25 MG tablet TAKE 1 TABLET ONCE DAILY.  90 tablet  3  . lisinopril (PRINIVIL,ZESTRIL) 20 MG tablet TAKE 1 TABLET ONCE DAILY.  90 tablet  1  . metFORMIN (GLUCOPHAGE) 1000 MG tablet TAKE 1 TABLET TWICE DAILY.  180 tablet  3  . NOVOLIN N 100 UNIT/ML injection INJECT 20 UNITS SUBCUTANEOUSLY IN THE MORNING AND 30 UNITS AT BEDTIME  30 mL  3  . NOVOLIN R 100 UNIT/ML injection INJECT 15-20 UNITS TWICE DAILY BEFORE BREAKFAST AND DINNER,AND 5-15 UNITS AT BEDTIME.  30 mL  3  . Omega-3 Fatty Acids (FISH OIL) 1000 MG CAPS Take 1 capsule by mouth daily.        . simvastatin (ZOCOR) 40 MG tablet TAKE 1 TABLET IN THE P.M.  90 tablet  3  . minocycline (MINOCIN,DYNACIN) 100 MG capsule       . tobramycin (TOBREX) 0.3 % ophthalmic solution        No current facility-administered medications for this visit.    History   Social History    . Marital Status: Single    Spouse Name: N/A    Number of Children: N/A  . Years of Education: N/A   Occupational History  . 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    Social History Main Topics  . Smoking status: Former Smoker -- 3.00 packs/day for 35 years    Types: Cigarettes    Quit date: 03/01/1990  . Smokeless tobacco: Never Used  . Alcohol Use: Yes  . Drug Use: No  . Sexual Activity: Not on file   Other Topics Concern  . 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     Family History  Problem Relation Age of Onset  . Cancer Father     metastatic  . Heart failure Father   . Heart disease Mother   . Heart attack Mother   . Hypertension Mother   . Diabetes Mother     Past Medical History  Diagnosis Date  . Morbid obesity   . Diverticulosis of colon   . Acute pancreatitis  Gallstone pancreatitis  . COPD (chronic obstructive pulmonary disease)   . OSA (obstructive sleep apnea)     Dr. Delton Coombes  . CAD (coronary artery disease)   . Cataract   . Nonexudative senile macular degeneration of retina   . Solitary pulmonary nodule     Dr. Delton Coombes  Followed by serial CTs  . Hyperlipidemia   . Osteoarthritis   . Anemia   . Diabetes mellitus, type 2   . Asthma   . Hypertension   . Stroke   1996     Remote past  . Ejection fraction     EF 55-60%, echo, March, 2013, Good RV function  . Aortic stenosis     Mild, echo, March, 2013,   . Atrial fibrillation     New diagnosis August 11, 2011  . DOE (dyspnea on exertion)     Dr. Delton Coombes,  obesity and deconditioning  playing a major role. No definite pulmonary hypertension on echo.  . Edema     Probably from venous insufficiency    Past Surgical History  Procedure Laterality Date  . Cholecystectomy    . Tonsillectomy      Patient Active  Problem List   Diagnosis Date Noted  . Mitral regurgitation 10/12/2012  . COPD (chronic obstructive pulmonary disease)   . Hyperlipidemia   . Diabetes mellitus, type 2   . Need for prophylactic vaccination and inoculation against influenza 11/29/2011  . Hematuria 11/29/2011  . Routine health maintenance 11/29/2011  . Atrial fibrillation   . Edema   . Stroke   1996   . Diverticulosis of colon   . Anemia   . Hypertension   . Aortic stenosis   . Ejection fraction   . Solitary pulmonary nodule   . DOE (dyspnea on exertion) 05/03/2011  . Wart of scalp 03/25/2011  . PAROTITIS 03/19/2010  . UNSPECIFIED ANEMIA 11/24/2009  . OBSTRUCTIVE SLEEP APNEA 11/24/2009  . MACULAR DEGENERATION, SENILE, NONEXUDATIVE 11/24/2009  . UNSPECIFIED CATARACT 11/24/2009  . CAD 11/24/2009  . OSTEOARTHRITIS, MODERATE 11/24/2009  . SHOULDER PAIN, RIGHT 11/24/2009  . PANCREATITIS, ACUTE, HX OF 11/24/2009  . Obesity, Class III, BMI 40-49.9 (morbid obesity) 11/18/2009    ROS   Patient denies fever, chills, headache, sweats, rash, change in vision, change in hearing, chest pain, cough, nausea vomiting, urinary symptoms. All other systems are reviewed and are negative.  PHYSICAL EXAM  Patient is significantly overweight. He is oriented to person time and place. Affect is normal. There is no jugulovenous distention. Lungs are clear. Respiratory effort is nonlabored. Cardiac exam reveals S1 and S2. There is a crescendo decrescendo systolic murmur. There no clicks or significant murmurs. The abdomen is soft. There is no peripheral edema.  Filed Vitals:   05/21/13 1450  BP: 112/52  Pulse: 61  Height: 6\' 2"  (1.88 m)  Weight: 307 lb (139.254 kg)  SpO2: 94%     ASSESSMENT & PLAN

## 2013-05-21 NOTE — Assessment & Plan Note (Signed)
Atrial fib rate is controlled. We know he has had atrial fib since June, 2013. He and I have discussed anticoagulation on multiple occasions. He's had a GI bleed in the past and severe hematuria in the past and he does not want to be anticoagulated. His rate is controlled. No change in therapy.

## 2013-06-21 ENCOUNTER — Ambulatory Visit (INDEPENDENT_AMBULATORY_CARE_PROVIDER_SITE_OTHER): Payer: Medicare Other | Admitting: Emergency Medicine

## 2013-06-21 ENCOUNTER — Encounter: Payer: Self-pay | Admitting: Emergency Medicine

## 2013-06-21 VITALS — BP 130/70 | HR 108 | Ht 74.0 in | Wt 308.2 lb

## 2013-06-21 DIAGNOSIS — J449 Chronic obstructive pulmonary disease, unspecified: Secondary | ICD-10-CM

## 2013-06-21 DIAGNOSIS — G4733 Obstructive sleep apnea (adult) (pediatric): Secondary | ICD-10-CM

## 2013-06-21 MED ORDER — FLUTTER DEVI: Status: DC

## 2013-06-21 NOTE — Patient Instructions (Signed)
Please continue your albuterol as needed We will refer you to pulmonary rehab We will arrange for a flutter valve Please continue CPAP. We will obtain a new chin strap.  Follow with Dr Delton CoombesByrum in 6 months or sooner if you have any problems

## 2013-06-21 NOTE — Assessment & Plan Note (Addendum)
continue CPAP qhs needs a new chin strap

## 2013-06-21 NOTE — Progress Notes (Signed)
73 yo former smoker, hx obesity, DM, HTN, OSA on CPAP 8. Also hx gallstone pancreatitis s/p chole in the past. Dx with COPD by pulmonologist in [place] many yrs ago. Has been on combivent, more recently Spiriva + ProAir. He doesn't take the Spiriva daily, doesn't see that it helps him. he does use ProAir, feels it helps him clear sputum. Also was followed for pulm nodule, he reports was stable on serial CTs over 5 yrs.  Able to walk around the house, but has to stop to rest if he walks over a block, climbs stairs. His last PFTs were done 6 -7 mo ago in [place]. Good compliance w CPAP, pressure never rechecked since '97.   ROV 02/11/10 -- follows up today for OSA/OHS, COPD. Last time we stopped Spiriva because he was only using as needed. He occas uses ProAir (about qod), helps him clear phlegm. Auto-titration CPAP study performed and shows that optimal CPAP is 14cmH2O (currently set on 8). CT scans available for review dating back to 2005 - There has been very little change up to last one done in 11/01/08: In fact it looks smaller to me (1.6x1.5cm from 1.8x1.5).   ROV 08/25/10 -- OSA/OHS, COPD. He is tolerating CPAP 14cmH2O. He is doing well, using ProAir prn - uses it when he is congested, very rare. He gets SOB with exertion, moves slowly,able to do projects around the house, work in the yard. He is happy w the breathing meds right now.   ROV 03/25/11 --  OSA/OHS, COPD. Regular f/u. We tried Symbicort last time to see if he would benefit. He took it for a week and then stopped. He didn't notice much difference. He uses ProAir but rarely. He is still having chest congestion, daily phlegm.   ROV 05/03/11 --  OSA/OHS, COPD. Has not benefited in past from long-acting meds, but last time we restarted Symbicort to see if it would help. He believes that his cough and chest congestion are both better. Still with significant exertional SOB. Good compliance w CPAP.   ROV 06/21/11 -- OSA/OHS, COPD. He had TTE since last time to  screen for PAH > shows some LAE, normal RV size and fxn (05/13/11). He remains on CPAP. He is using symbicort, probably some improvement.   ROV 10/22/11 -- OSA/OHS, COPD. He is doing CPAP, has benefited from pulm rehab, has lost 10 lbs. He is on Symbicort, but stopped doing it regularly. He does have SABA but doesn't use regularly.   ROV 06/21/13 -- OSA/OHS on CPAP, COPD. Returns for f/u today. He has albuterol available for prn use. He is not on scheduled BD's - used to be on spiriva and symbicort. He has a lot of cough and clear mucous year round. Some difficulty getting it out. His exercise if limited - very sedentary lifestyle. He just got a new CPAP mask, doing well with it. Interested in doing pulm rehab again.    Filed Vitals:   06/21/13 1504  BP: 130/70  Pulse: 108  Height: 6\' 2"  (1.88 m)  Weight: 308 lb 3.2 oz (139.799 kg)  SpO2: 94%   Gen: Pleasant, obese man, in no distress,  normal affect  ENT: No lesions,  mouth clear,  oropharynx clear, no postnasal drip  Neck: No JVD, no TMG, no carotid bruits  Lungs: No use of accessory muscles, no dullness to percussion, clear without rales or rhonchi  Cardiovascular: RRR, heart sounds normal, no murmur or gallops, no peripheral edema  Musculoskeletal: No deformities,  no cyanosis or clubbing  Neuro: alert, non focal  Skin: Warm, no lesions or rashes   OBSTRUCTIVE SLEEP APNEA continue CPAP qhs needs a new chin strap  COPD (chronic obstructive pulmonary disease) - continue prn SABA; may be ready to restart scheduled BD's once he starts rehab and notices any exertional dyspnea - flutter valve

## 2013-06-21 NOTE — Assessment & Plan Note (Signed)
-   continue prn SABA; may be ready to restart scheduled BD's once he starts rehab and notices any exertional dyspnea - flutter valve

## 2013-06-21 NOTE — Addendum Note (Signed)
Addended by: Gwynneth AlimentLAWRENCE, Tameika Heckmann A on: 06/21/2013 03:36 PM   Modules accepted: Orders

## 2013-07-03 LAB — HM DIABETES EYE EXAM

## 2013-07-04 ENCOUNTER — Encounter: Payer: Self-pay | Admitting: Emergency Medicine

## 2013-07-04 ENCOUNTER — Ambulatory Visit: Payer: Medicare Other | Admitting: Physician Assistant

## 2013-07-06 ENCOUNTER — Telehealth (HOSPITAL_COMMUNITY): Payer: Self-pay

## 2013-07-06 NOTE — Telephone Encounter (Signed)
I have called and left a message with [name] to inquire about participation in Pulmonary Rehab. Will send letter in mail and follow up.

## 2013-07-19 ENCOUNTER — Other Ambulatory Visit (INDEPENDENT_AMBULATORY_CARE_PROVIDER_SITE_OTHER): Payer: Medicare Other

## 2013-07-19 ENCOUNTER — Encounter: Payer: Self-pay | Admitting: Internal Medicine

## 2013-07-19 ENCOUNTER — Ambulatory Visit (INDEPENDENT_AMBULATORY_CARE_PROVIDER_SITE_OTHER): Payer: Medicare Other | Admitting: Internal Medicine

## 2013-07-19 VITALS — BP 128/72 | HR 71 | Temp 97.0°F | Resp 16 | Ht 74.0 in | Wt 302.0 lb

## 2013-07-19 DIAGNOSIS — E1139 Type 2 diabetes mellitus with other diabetic ophthalmic complication: Secondary | ICD-10-CM

## 2013-07-19 DIAGNOSIS — I10 Essential (primary) hypertension: Secondary | ICD-10-CM

## 2013-07-19 DIAGNOSIS — Z860100 Personal history of colon polyps, unspecified: Secondary | ICD-10-CM | POA: Insufficient documentation

## 2013-07-19 DIAGNOSIS — E1165 Type 2 diabetes mellitus with hyperglycemia: Principal | ICD-10-CM

## 2013-07-19 DIAGNOSIS — N400 Enlarged prostate without lower urinary tract symptoms: Secondary | ICD-10-CM | POA: Insufficient documentation

## 2013-07-19 DIAGNOSIS — E785 Hyperlipidemia, unspecified: Secondary | ICD-10-CM

## 2013-07-19 DIAGNOSIS — Z0000 Encounter for general adult medical examination without abnormal findings: Secondary | ICD-10-CM

## 2013-07-19 DIAGNOSIS — E119 Type 2 diabetes mellitus without complications: Secondary | ICD-10-CM

## 2013-07-19 DIAGNOSIS — Z23 Encounter for immunization: Secondary | ICD-10-CM

## 2013-07-19 LAB — LIPID PANEL
CHOLESTEROL: 144 mg/dL (ref 0–200)
HDL: 31.5 mg/dL — AB (ref >39.00)
LDL Cholesterol: 100 mg/dL — ABNORMAL HIGH (ref 0–99)
TRIGLYCERIDES: 64 mg/dL (ref 0.0–149.0)
Total CHOL/HDL Ratio: 5
VLDL: 12.8 mg/dL (ref 0.0–40.0)

## 2013-07-19 LAB — URINALYSIS, ROUTINE W REFLEX MICROSCOPIC
Bilirubin Urine: NEGATIVE
HGB URINE DIPSTICK: NEGATIVE
Ketones, ur: NEGATIVE
Leukocytes, UA: NEGATIVE
Nitrite: NEGATIVE
RBC / HPF: NONE SEEN (ref 0–?)
Specific Gravity, Urine: >=1.03 — AB (ref 1.000–1.030)
URINE GLUCOSE: NEGATIVE
UROBILINOGEN UA: 1 (ref 0.0–1.0)
pH: 5.5 (ref 5.0–8.0)

## 2013-07-19 LAB — COMPREHENSIVE METABOLIC PANEL
ALBUMIN: 3.4 g/dL — AB (ref 3.5–5.2)
ALT: 13 U/L (ref 0–53)
AST: 25 U/L (ref 0–37)
Alkaline Phosphatase: 60 U/L (ref 39–117)
BUN: 16 mg/dL (ref 6–23)
CALCIUM: 8.9 mg/dL (ref 8.4–10.5)
CO2: 31 mEq/L (ref 19–32)
Chloride: 101 mEq/L (ref 96–112)
Creatinine, Ser: 1.1 mg/dL (ref 0.4–1.5)
GFR: 72.48 mL/min (ref >60.00)
Glucose, Bld: 155 mg/dL — ABNORMAL HIGH (ref 70–99)
Potassium: 4.3 mEq/L (ref 3.5–5.1)
Sodium: 139 mEq/L (ref 135–145)
Total Bilirubin: 0.8 mg/dL (ref 0.2–1.2)
Total Protein: 6.7 g/dL (ref 6.0–8.3)

## 2013-07-19 LAB — CBC WITH DIFFERENTIAL/PLATELET
Basophils Absolute: 0 10*3/uL (ref 0.0–0.1)
Basophils Relative: 0.6 % (ref 0.0–3.0)
EOS ABS: 0.2 10*3/uL (ref 0.0–0.7)
Eosinophils Relative: 3.1 % (ref 0.0–5.0)
HCT: 41.2 % (ref 39.0–52.0)
Hemoglobin: 13.6 g/dL (ref 13.0–17.0)
LYMPHS PCT: 18.4 % (ref 12.0–46.0)
Lymphs Abs: 1.2 10*3/uL (ref 0.7–4.0)
MCHC: 33 g/dL (ref 30.0–36.0)
MCV: 91.7 fl (ref 78.0–100.0)
Monocytes Absolute: 0.5 10*3/uL (ref 0.1–1.0)
Monocytes Relative: 7 % (ref 3.0–12.0)
NEUTROS ABS: 4.7 10*3/uL (ref 1.4–7.7)
NEUTROS PCT: 70.9 % (ref 43.0–77.0)
PLATELETS: 278 10*3/uL (ref 150.0–400.0)
RBC: 4.49 Mil/uL (ref 4.22–5.81)
RDW: 14.6 % (ref 11.5–15.5)
WBC: 6.6 10*3/uL (ref 4.0–10.5)

## 2013-07-19 LAB — HEMOGLOBIN A1C: Hgb A1c MFr Bld: 7.5 % — ABNORMAL HIGH (ref 4.6–6.5)

## 2013-07-19 LAB — TSH: TSH: 1.27 u[IU]/mL (ref 0.35–4.50)

## 2013-07-19 LAB — PSA: PSA: 1.28 ng/mL (ref 0.10–4.00)

## 2013-07-19 MED ORDER — PNEUMOCOCCAL 13-VAL CONJ VACC IM SUSP: 0.5000 mL | INTRAMUSCULAR | Status: DC

## 2013-07-19 MED ORDER — TETANUS-DIPHTH-ACELL PERTUSSIS 5-2.5-18.5 LF-MCG/0.5 IM SUSP: 0.5000 mL | Freq: Once | INTRAMUSCULAR | Status: DC

## 2013-07-19 NOTE — Progress Notes (Signed)
Subjective:    Patient ID: Charles RudReuben [name], male    DOB: 08-Sep-1940, 73 y.o.   MRN: [id number]  Diabetes He presents for his follow-up diabetic visit. He has type 2 diabetes mellitus. His disease course has been stable. There are no hypoglycemic associated symptoms. Pertinent negatives for hypoglycemia include no dizziness, headaches or tremors. Pertinent negatives for diabetes include no blurred vision, no chest pain, no fatigue, no foot paresthesias, no foot ulcerations, no polydipsia, no polyphagia, no polyuria, no visual change, no weakness and no weight loss. There are no hypoglycemic complications. Symptoms are stable. Diabetic complications include heart disease. Current diabetic treatment includes oral agent (monotherapy) and insulin injections. He is compliant with treatment most of the time. His weight is stable. He is following a generally healthy diet. Meal planning includes avoidance of concentrated sweets. He participates in exercise intermittently. There is no change in his home blood glucose trend. An ACE inhibitor/angiotensin II receptor blocker is being taken. He does not see a podiatrist.Eye exam is current.      Review of Systems  Constitutional: Negative for fever, chills, weight loss, diaphoresis, activity change, appetite change, fatigue and unexpected weight change.  HENT: Negative.   Eyes: Negative.  Negative for blurred vision.  Respiratory: Negative.  Negative for cough, choking, chest tightness, shortness of breath and stridor.   Cardiovascular: Negative.  Negative for chest pain, palpitations and leg swelling.  Gastrointestinal: Negative.  Negative for nausea, vomiting, abdominal pain, diarrhea, constipation and blood in stool.  Endocrine: Negative.  Negative for polydipsia, polyphagia and polyuria.  Genitourinary: Negative.   Musculoskeletal: Negative.  Negative for arthralgias, back pain, joint swelling, myalgias and neck stiffness.  Skin: Negative.  Negative  for rash.  Allergic/Immunologic: Negative.   Neurological: Negative.  Negative for dizziness, tremors, weakness, light-headedness, numbness and headaches.  Hematological: Negative.  Negative for adenopathy. Does not bruise/bleed easily.  Psychiatric/Behavioral: Negative.        Objective:   Physical Exam  Vitals reviewed. Constitutional: He is oriented to person, place, and time. He appears well-developed and well-nourished. No distress.  HENT:  Head: Normocephalic and atraumatic.  Mouth/Throat: Oropharynx is clear and moist. No oropharyngeal exudate.  Eyes: Conjunctivae are normal. Right eye exhibits no discharge. Left eye exhibits no discharge. No scleral icterus.  Neck: Normal range of motion. Neck supple. No JVD present. No tracheal deviation present. No thyromegaly present.  Cardiovascular: Normal rate, S1 normal, S2 normal and intact distal pulses.  An irregularly irregular rhythm present. Exam reveals no gallop, no S3, no S4, no distant heart sounds and no friction rub.   Murmur heard.  Decrescendo systolic murmur is present with a grade of 1/6   No diastolic murmur is present  Pulmonary/Chest: Effort normal and breath sounds normal. No stridor. No respiratory distress. He has no wheezes. He has no rales. He exhibits no tenderness.  Abdominal: Soft. Bowel sounds are normal. He exhibits no distension and no mass. There is no tenderness. There is no rebound and no guarding. Hernia confirmed negative in the right inguinal area and confirmed negative in the left inguinal area.  Genitourinary: Rectum normal, testes normal and penis normal. Rectal exam shows no external hemorrhoid, no internal hemorrhoid, no fissure, no mass, no tenderness and anal tone normal. Guaiac negative stool. Prostate is enlarged (1+ smooth symm BPH). Prostate is not tender. Right testis shows no mass, no swelling and no tenderness. Right testis is descended. Left testis shows no mass, no swelling and no tenderness.  Left testis  is descended. Uncircumcised. No phimosis, paraphimosis, hypospadias, penile erythema or penile tenderness. No discharge found.  Musculoskeletal: Normal range of motion. He exhibits edema (trace edema in BLE). He exhibits no tenderness.  Lymphadenopathy:    He has no cervical adenopathy.       Right: No inguinal adenopathy present.       Left: No inguinal adenopathy present.  Neurological: He is oriented to person, place, and time.  Skin: Skin is warm. No rash noted. He is not diaphoretic. No erythema. No pallor.  Psychiatric: He has a normal mood and affect. His behavior is normal. Judgment and thought content normal.     Lab Results  Component Value Date   WBC 8.3 10/07/2011   HGB 14.7 10/05/2012   HCT 44.8 10/05/2012   PLT 217.0 10/07/2011   GLUCOSE 146* 10/05/2012   CHOL 140 10/05/2012   TRIG 62.0 10/05/2012   HDL 39.60 10/05/2012   LDLCALC 88 10/05/2012   ALT 12 10/05/2012   ALT 12 10/05/2012   AST 28 10/05/2012   AST 28 10/05/2012   NA 143 10/05/2012   K 4.2 10/05/2012   CL 102 10/05/2012   CREATININE 1.1 10/05/2012   BUN 18 10/05/2012   CO2 30 10/05/2012   TSH 1.30 08/18/2011   INR 1.5* 10/07/2011   HGBA1C 7.2* 01/11/2013       Assessment & Plan:

## 2013-07-19 NOTE — Patient Instructions (Signed)
Type 2 Diabetes Mellitus, Adult Type 2 diabetes mellitus, often simply referred to as type 2 diabetes, is a long-lasting (chronic) disease. In type 2 diabetes, the pancreas does not make enough insulin (a hormone), the cells are less responsive to the insulin that is made (insulin resistance), or both. Normally, insulin moves sugars from food into the tissue cells. The tissue cells use the sugars for energy. The lack of insulin or the lack of normal response to insulin causes excess sugars to build up in the blood instead of going into the tissue cells. As a result, high blood sugar (hyperglycemia) develops. The effect of high sugar (glucose) levels can cause many complications. Type 2 diabetes was also previously called adult-onset diabetes but it can occur at any age.  RISK FACTORS  A person is predisposed to developing type 2 diabetes if someone in the family has the disease and also has one or more of the following primary risk factors:  Overweight.  An inactive lifestyle.  A history of consistently eating high-calorie foods. Maintaining a normal weight and regular physical activity can reduce the chance of developing type 2 diabetes. SYMPTOMS  A person with type 2 diabetes may not show symptoms initially. The symptoms of type 2 diabetes appear slowly. The symptoms include:  Increased thirst (polydipsia).  Increased urination (polyuria).  Increased urination during the night (nocturia).  Weight loss. This weight loss may be rapid.  Frequent, recurring infections.  Tiredness (fatigue).  Weakness.  Vision changes, such as blurred vision.  Fruity smell to your breath.  Abdominal pain.  Nausea or vomiting.  Cuts or bruises which are slow to heal.  Tingling or numbness in the hands or feet. DIAGNOSIS Type 2 diabetes is frequently not diagnosed until complications of diabetes are present. Type 2 diabetes is diagnosed when symptoms or complications are present and when blood  glucose levels are increased. Your blood glucose level may be checked by one or more of the following blood tests:  A fasting blood glucose test. You will not be allowed to eat for at least 8 hours before a blood sample is taken.  A random blood glucose test. Your blood glucose is checked at any time of the day regardless of when you ate.  A hemoglobin A1c blood glucose test. A hemoglobin A1c test provides information about blood glucose control over the previous 3 months.  An oral glucose tolerance test (OGTT). Your blood glucose is measured after you have not eaten (fasted) for 2 hours and then after you drink a glucose-containing beverage. TREATMENT   You may need to take insulin or diabetes medicine daily to keep blood glucose levels in the desired range.  You will need to match insulin dosing with exercise and healthy food choices. The treatment goal is to maintain the before meal blood sugar (preprandial glucose) level at 70 130 mg/dL. HOME CARE INSTRUCTIONS   Have your hemoglobin A1c level checked twice a year.  Perform daily blood glucose monitoring as directed by your caregiver.  Monitor urine ketones when you are ill and as directed by your caregiver.  Take your diabetes medicine or insulin as directed by your caregiver to maintain your blood glucose levels in the desired range.  Never run out of diabetes medicine or insulin. It is needed every day.  Adjust insulin based on your intake of carbohydrates. Carbohydrates can raise blood glucose levels but need to be included in your diet. Carbohydrates provide vitamins, minerals, and fiber which are an essential part of   a healthy diet. Carbohydrates are found in fruits, vegetables, whole grains, dairy products, legumes, and foods containing added sugars.    Eat healthy foods. Alternate 3 meals with 3 snacks.  Lose weight if overweight.  Carry a medical alert card or wear your medical alert jewelry.  Carry a 15 gram  carbohydrate snack with you at all times to treat low blood glucose (hypoglycemia). Some examples of 15 gram carbohydrate snacks include:  Glucose tablets, 3 or 4   Glucose gel, 15 gram tube  Raisins, 2 tablespoons (24 grams)  Jelly beans, 6  Animal crackers, 8  Regular pop, 4 ounces (120 mL)  Gummy treats, 9  Recognize hypoglycemia. Hypoglycemia occurs with blood glucose levels of 70 mg/dL and below. The risk for hypoglycemia increases when fasting or skipping meals, during or after intense exercise, and during sleep. Hypoglycemia symptoms can include:  Tremors or shakes.  Decreased ability to concentrate.  Sweating.  Increased heart rate.  Headache.  Dry mouth.  Hunger.  Irritability.  Anxiety.  Restless sleep.  Altered speech or coordination.  Confusion.  Treat hypoglycemia promptly. If you are alert and able to safely swallow, follow the 15:15 rule:  Take 15 20 grams of rapid-acting glucose or carbohydrate. Rapid-acting options include glucose gel, glucose tablets, or 4 ounces (120 mL) of fruit juice, regular soda, or low fat milk.  Check your blood glucose level 15 minutes after taking the glucose.  Take 15 20 grams more of glucose if the repeat blood glucose level is still 70 mg/dL or below.  Eat a meal or snack within 1 hour once blood glucose levels return to normal.    Be alert to polyuria and polydipsia which are early signs of hyperglycemia. An early awareness of hyperglycemia allows for prompt treatment. Treat hyperglycemia as directed by your caregiver.  Engage in at least 150 minutes of moderate-intensity physical activity a week, spread over at least 3 days of the week or as directed by your caregiver. In addition, you should engage in resistance exercise at least 2 times a week or as directed by your caregiver.  Adjust your medicine and food intake as needed if you start a new exercise or sport.  Follow your sick day plan at any time you  are unable to eat or drink as usual.  Avoid tobacco use.  Limit alcohol intake to no more than 1 drink per day for nonpregnant women and 2 drinks per day for men. You should drink alcohol only when you are also eating food. Talk with your caregiver whether alcohol is safe for you. Tell your caregiver if you drink alcohol several times a week.  Follow up with your caregiver regularly.  Schedule an eye exam soon after the diagnosis of type 2 diabetes and then annually.  Perform daily skin and foot care. Examine your skin and feet daily for cuts, bruises, redness, nail problems, bleeding, blisters, or sores. A foot exam by a caregiver should be done annually.  Brush your teeth and gums at least twice a day and floss at least once a day. Follow up with your dentist regularly.  Share your diabetes management plan with your workplace or school.  Stay up-to-date with immunizations.  Learn to manage stress.  Obtain ongoing diabetes education and support as needed.  Participate in, or seek rehabilitation as needed to maintain or improve independence and quality of life. Request a physical or occupational therapy referral if you are having foot or hand numbness or difficulties with grooming,   dressing, eating, or physical activity. SEEK MEDICAL CARE IF:   You are unable to eat food or drink fluids for more than 6 hours.  You have nausea and vomiting for more than 6 hours.  Your blood glucose level is over 240 mg/dL.  There is a change in mental status.  You develop an additional serious illness.  You have diarrhea for more than 6 hours.  You have been sick or have had a fever for a couple of days and are not getting better.  You have pain during any physical activity.  SEEK IMMEDIATE MEDICAL CARE IF:  You have difficulty breathing.  You have moderate to large ketone levels. MAKE SURE YOU:  Understand these instructions.  Will watch your condition.  Will get help right away if  you are not doing well or get worse. Document Released: 02/15/2005 Document Revised: 11/10/2011 Document Reviewed: 09/14/2011 ExitCare Patient Information 2014 [employer]. Health Maintenance, Males A healthy lifestyle and preventative care can promote health and wellness.  Maintain regular health, dental, and eye exams.  Eat a healthy diet. Foods like vegetables, fruits, whole grains, low-fat dairy products, and lean protein foods contain the nutrients you need and are low in calories. Decrease your intake of foods high in solid fats, added sugars, and salt. Get information about a proper diet from your health care provider, if necessary.  Regular physical exercise is one of the most important things you can do for your health. Most adults should get at least 150 minutes of moderate-intensity exercise (any activity that increases your heart rate and causes you to sweat) each week. In addition, most adults need muscle-strengthening exercises on 2 or more days a week.   Maintain a healthy weight. The body mass index (BMI) is a screening tool to identify possible weight problems. It provides an estimate of body fat based on height and weight. Your health care provider can find your BMI and can help you achieve or maintain a healthy weight. For males 20 years and older:  A BMI below 18.5 is considered underweight.  A BMI of 18.5 to 24.9 is normal.  A BMI of 25 to 29.9 is considered overweight.  A BMI of 30 and above is considered obese.  Maintain normal blood lipids and cholesterol by exercising and minimizing your intake of saturated fat. Eat a balanced diet with plenty of fruits and vegetables. Blood tests for lipids and cholesterol should begin at age 20 and be repeated every 5 years. If your lipid or cholesterol levels are high, you are over 50, or you are at high risk for heart disease, you may need your cholesterol levels checked more frequently.Ongoing high lipid and cholesterol  levels should be treated with medicines, if diet and exercise are not working.  If you smoke, find out from your health care provider how to quit. If you do not use tobacco, do not start.  Lung cancer screening is recommended for adults aged 55 80 years who are at high risk for developing lung cancer because of a history of smoking. A yearly low-dose CT scan of the lungs is recommended for people who have at least a 30-pack-year history of smoking and are a current smoker or have quit within the past 15 years. A pack year of smoking is smoking an average of 1 pack of cigarettes a day for 1 year (for example, a 30-pack-year history of smoking could mean smoking 1 pack a day for 30 years or 2 packs a day   for 15 years). Yearly screening should continue until the smoker has stopped smoking for at least 15 years. Yearly screening should be stopped for people who develop a health problem that would prevent them from having lung cancer treatment.  If you choose to drink alcohol, do not have more than 2 drinks per day. One drink is considered to be 12 oz (360 mL) of beer, 5 oz (150 mL) of wine, or 1.5 oz (45 mL) of liquor.  Avoid use of street drugs. Do not share needles with anyone. Ask for help if you need support or instructions about stopping the use of drugs.  High blood pressure causes heart disease and increases the risk of stroke. Blood pressure should be checked at least every 1 2 years. Ongoing high blood pressure should be treated with medicines if weight loss and exercise are not effective.  If you are 45 75 years old, ask your health care provider if you should take aspirin to prevent heart disease.  Diabetes screening involves taking a blood sample to check your fasting blood sugar level. This should be done once every 3 years after age 45, if you are at a normal weight and without risk factors for diabetes. Testing should be considered at a younger age or be carried out more frequently if you  are overweight and have at least 1 risk factor for diabetes.  Colorectal cancer can be detected and often prevented. Most routine colorectal cancer screening begins at the age of 50 and continues through age 75. However, your health care provider may recommend screening at an earlier age if you have risk factors for colon cancer. On a yearly basis, your health care provider may provide home test kits to check for hidden blood in the stool. A small camera at the end of a tube may be used to directly examine the colon (sigmoidoscopy or colonoscopy) to detect the earliest forms of colorectal cancer. Talk to your health care provider about this at age 50, when routine screening begins. A direct exam of the colon should be repeated every 5 10 years through age 75, unless early forms of pre-cancerous polyps or small growths are found.  People who are at an increased risk for hepatitis B should be screened for this virus. You are considered at high risk for hepatitis B if:  You were born in a country where hepatitis B occurs often. Talk with your health care provider about which countries are considered high-risk.  Your parents were born in a high-risk country and you have not received a shot to protect against hepatitis B (hepatitis B vaccine).  You have HIV or AIDS.  You use needles to inject street drugs.  You live with, or have sex with, someone who has hepatitis B.  You are a man who has sex with other men (MSM).  You get hemodialysis treatment.  You take certain medicines for conditions like cancer, organ transplantation, and autoimmune conditions.  Hepatitis C blood testing is recommended for all people born from 1945 through 1965 and any individual with known risk factors for hepatitis C.  Healthy men should no longer receive prostate-specific antigen (PSA) blood tests as part of routine cancer screening. Talk to your health care provider about prostate cancer screening.  Testicular cancer  screening is not recommended for adolescents or adult males who have no symptoms. Screening includes self-exam, a health care provider exam, and other screening tests. Consult with your health care provider about any symptoms you have or   any concerns you have about testicular cancer.  Practice safe sex. Use condoms and avoid high-risk sexual practices to reduce the spread of sexually transmitted infections (STIs).  Use sunscreen. Apply sunscreen liberally and repeatedly throughout the day. You should seek shade when your shadow is shorter than you. Protect yourself by wearing long sleeves, pants, a wide-brimmed hat, and sunglasses year round, whenever you are outdoors.  Tell your health care provider of new moles or changes in moles, especially if there is a change in shape or color. Also tell your provider if a mole is larger than the size of a pencil eraser.  A one-time screening for abdominal aortic aneurysm (AAA) and surgical repair of large AAAs by ultrasound is recommended for men aged 65 75 years who are current or former smokers.  Stay current with your vaccines (immunizations). Document Released: 08/14/2007 Document Revised: 12/06/2012 Document Reviewed: 07/13/2010 ExitCare Patient Information 2014 [employer].  

## 2013-07-19 NOTE — Progress Notes (Signed)
Pre visit review using our clinic review tool, if applicable. No additional management support is needed unless otherwise documented below in the visit note. 

## 2013-07-20 ENCOUNTER — Encounter: Payer: Self-pay | Admitting: Internal Medicine

## 2013-07-20 NOTE — Assessment & Plan Note (Signed)
His blood sugars are adequately well controlled 

## 2013-07-20 NOTE — Assessment & Plan Note (Signed)
He will work on his lifestyle modifications to lose weight 

## 2013-07-20 NOTE — Assessment & Plan Note (Signed)
His BP is well controlled 

## 2013-07-20 NOTE — Assessment & Plan Note (Addendum)

## 2013-07-20 NOTE — Assessment & Plan Note (Signed)
He has achieved his LDL goal 

## 2013-07-20 NOTE — Assessment & Plan Note (Signed)
I will check his PSA today He has no s/s that  need to be treated

## 2013-07-20 NOTE — Assessment & Plan Note (Signed)
He is due for a f/up colonoscopy 

## 2013-07-31 ENCOUNTER — Other Ambulatory Visit: Payer: Self-pay

## 2013-07-31 MED ORDER — LISINOPRIL 20 MG PO TABS: ORAL_TABLET | ORAL | Status: DC

## 2013-08-06 ENCOUNTER — Encounter (HOSPITAL_COMMUNITY)
Admission: RE | Admit: 2013-08-06 | Discharge: 2013-08-06 | Disposition: A | Discharge status: Home / Self Care | Payer: Medicare Other | Source: Ambulatory Visit | Attending: Emergency Medicine | Admitting: Emergency Medicine

## 2013-08-06 ENCOUNTER — Encounter (HOSPITAL_COMMUNITY): Payer: Self-pay

## 2013-08-06 ENCOUNTER — Ambulatory Visit (HOSPITAL_COMMUNITY): Payer: Medicare Other

## 2013-08-06 VITALS — BP 120/52 | HR 68 | Resp 18 | Ht 73.0 in | Wt 299.8 lb

## 2013-08-06 DIAGNOSIS — J4489 Other specified chronic obstructive pulmonary disease: Secondary | ICD-10-CM | POA: Insufficient documentation

## 2013-08-06 DIAGNOSIS — R911 Solitary pulmonary nodule: Secondary | ICD-10-CM | POA: Diagnosis not present

## 2013-08-06 DIAGNOSIS — G4733 Obstructive sleep apnea (adult) (pediatric): Secondary | ICD-10-CM

## 2013-08-06 DIAGNOSIS — J449 Chronic obstructive pulmonary disease, unspecified: Secondary | ICD-10-CM | POA: Insufficient documentation

## 2013-08-06 NOTE — Progress Notes (Addendum)
Charles [name] 73 y.o. male Pulmonary Rehab Orientation Note Patient arrived today in [facility] for orientation to Pulmonary Rehab. He was transported from [facility] via wheel chair. He does not carry portable oxygen. Per pt, he uses oxygen never, but wears his CPAP every night for OSA. Color good, skin warm and dry. Patient is oriented to time and place. Patient's medical history and medications reviewed. Heart rate is normal, breath sounds clear to auscultation, no wheezes, rales, or rhonchi, diminished. Grip strength equal, strong. Distal pulses faint but palpable. Patient reports he does take medications as prescribed. Patient states he follows a Diabetic. The patient reports no specific efforts to gain or lose weight.. Patient's weight will be monitored closely. Demonstration and practice of PLB using pulse oximeter. Patient able to return demonstration satisfactorily. Safety and hand hygiene in the exercise area reviewed with patient. Patient voices understanding of the information reviewed. Department expectations discussed with patient and achievable goals were set. The patient shows enthusiasm about attending the program and we look forward to working with this nice gentleman. The patient is scheduled for a 6 min walk test on Tuesday 6/16 @3 :15 and to begin exercise on Thursday 6/18@ 1030. Patient did verbalize that he has an abscessed tooth and has been referred to see a surgeon for extraction. Patient stated he was calling the surgeons office this pm to schedule appointment.

## 2013-08-07 ENCOUNTER — Telehealth (HOSPITAL_COMMUNITY): Payer: Self-pay

## 2013-08-09 ENCOUNTER — Encounter (HOSPITAL_COMMUNITY)
Admission: RE | Admit: 2013-08-09 | Discharge: 2013-08-09 | Disposition: A | Discharge status: Home / Self Care | Payer: Medicare Other | Source: Ambulatory Visit | Attending: Internal Medicine | Admitting: Internal Medicine

## 2013-08-14 ENCOUNTER — Encounter (HOSPITAL_COMMUNITY)
Admission: RE | Admit: 2013-08-14 | Discharge: 2013-08-14 | Disposition: A | Discharge status: Home / Self Care | Payer: Medicare Other | Source: Ambulatory Visit | Attending: Emergency Medicine | Admitting: Emergency Medicine

## 2013-08-14 DIAGNOSIS — R911 Solitary pulmonary nodule: Secondary | ICD-10-CM | POA: Diagnosis not present

## 2013-08-14 NOTE — Progress Notes (Signed)
Charles [name] completed a Six-Minute Walk Test on 08/14/13 . Charles [name] walked 651 feet with 0 breaks.  The patient's lowest oxygen saturation was 93% , highest heart rate was 119 , and highest blood pressure was 158/60. The patient was on room air. Charles [name] stated that hip pain 5/10 hindered his walk test.

## 2013-08-16 ENCOUNTER — Encounter (HOSPITAL_COMMUNITY)
Admission: RE | Admit: 2013-08-16 | Discharge: 2013-08-16 | Disposition: A | Discharge status: Home / Self Care | Payer: Medicare Other | Source: Ambulatory Visit | Attending: Emergency Medicine | Admitting: Emergency Medicine

## 2013-08-16 DIAGNOSIS — R911 Solitary pulmonary nodule: Secondary | ICD-10-CM | POA: Diagnosis not present

## 2013-08-16 NOTE — Progress Notes (Signed)
Today, Charles [name] exercised at [facility]. Service time was from 10:30am to 12:15pm.  The patient exercised for more than 31 minutes performing aerobic, strengthening, and stretching exercises. Oxygen saturation, heart rate, blood pressure, rate of perceived exertion, and shortness of breath were all monitored before, during, and after exercise. Charles [name] presented with no problems at today's exercise session. The patient attended Pulmonary Education Class today on the topic of Medications with Charles [name].    There was no workload change during today's exercise session.  Pre-exercise vitals:   Weight kg: 135.2   Liters of O2: ra   SpO2: 95   HR: 84   BP: 132/52   CBG: 110  Exercise vitals:   Highest heartrate:  88   Lowest oxygen saturation: 95   Highest blood pressure: 130/62   Liters of 02: ra  Post-exercise vitals:   SpO2: 94   HR: 58   BP: 118/68   Liters of O2: ra   CBG: 113  Dr. Kalman ShanMurali [name], Medical Director Dr. Gwenlyn PerkingMadera is immediately available during today's Pulmonary Rehab session for Charles [name] on 08/16/13 at 10:30am class time.

## 2013-08-21 ENCOUNTER — Encounter (HOSPITAL_COMMUNITY)
Admission: RE | Admit: 2013-08-21 | Discharge: 2013-08-21 | Disposition: A | Discharge status: Home / Self Care | Payer: Medicare Other | Source: Ambulatory Visit | Attending: Emergency Medicine | Admitting: Emergency Medicine

## 2013-08-21 DIAGNOSIS — R911 Solitary pulmonary nodule: Secondary | ICD-10-CM | POA: Diagnosis not present

## 2013-08-23 ENCOUNTER — Encounter (HOSPITAL_COMMUNITY)
Admission: RE | Admit: 2013-08-23 | Discharge: 2013-08-23 | Disposition: A | Discharge status: Home / Self Care | Payer: Medicare Other | Source: Ambulatory Visit | Attending: Emergency Medicine | Admitting: Emergency Medicine

## 2013-08-23 DIAGNOSIS — R911 Solitary pulmonary nodule: Secondary | ICD-10-CM | POA: Diagnosis not present

## 2013-08-23 LAB — GLUCOSE, CAPILLARY: Glucose-Capillary: 136 mg/dL — ABNORMAL HIGH (ref 70–99)

## 2013-08-23 NOTE — Progress Notes (Signed)
I have reviewed a Home Exercise Prescription with Charles [name] . Charles [name] is not currently exercising at home.  The patient was advised to walk 2 days a week for 15 minutes.  Charles Daleseuben and I discussed how to progress their exercise prescription.  The patient stated that their goals were to increase endurance and lose weight.  The patient stated that they understand the exercise prescription.  We reviewed exercise guidelines, target heart rate during exercise, oxygen use, weather, home pulse oximeter, endpoints for exercise, and goals.  Patient is encouraged to come to me with any questions. I will continue to follow up with the patient to assist them with progression and safety.

## 2013-08-23 NOTE — Progress Notes (Signed)
Today, Charles [name] exercised at [facility]. Service time was from 10:30am to 12:15.  The patient exercised for more than 31 minutes performing aerobic, strengthening, and stretching exercises. Oxygen saturation, heart rate, blood pressure, rate of perceived exertion, and shortness of breath were all monitored before, during, and after exercise. Charles DalesReuben presented with no problems at today's exercise session. The patient attended Stress and Energy Conservation class today.  There was no workload change during today's exercise session.  Pre-exercise vitals:   Weight kg: 135.4   Liters of O2: ra   SpO2: 95   HR: 73   BP: 118/52   CBG: 130  Exercise vitals:   Highest heartrate:  93   Lowest oxygen saturation: 93   Highest blood pressure: 126/70   Liters of 02: ra  Post-exercise vitals:   SpO2: 95   HR: 55   BP: 122/56   Liters of O2: ra   CBG: 136  Dr. Kalman ShanMurali Ramaswamy, Medical Director Dr. David StallFeliz-Ortiz is immediately available during today's Pulmonary Rehab session [name] on 08/23/13 at 10:30am class time.

## 2013-08-28 ENCOUNTER — Encounter (HOSPITAL_COMMUNITY)
Admission: RE | Admit: 2013-08-28 | Discharge: 2013-08-28 | Disposition: A | Discharge status: Home / Self Care | Payer: Medicare Other | Source: Ambulatory Visit | Attending: Emergency Medicine | Admitting: Emergency Medicine

## 2013-08-28 DIAGNOSIS — R911 Solitary pulmonary nodule: Secondary | ICD-10-CM | POA: Diagnosis not present

## 2013-08-28 NOTE — Progress Notes (Signed)
Today, Charles [name] exercised at [facility]. Service time was from 1030 to 1215.  The patient exercised for more than 31 minutes performing aerobic, strengthening, and stretching exercises. Oxygen saturation, heart rate, blood pressure, rate of perceived exertion, and shortness of breath were all monitored before, during, [name], Medical Director Dr. David StallFeliz-Ortiz is immediately available during today's Pulmonary Rehab session [name] on 08/28/2013 at 1030 class time.

## 2013-08-30 ENCOUNTER — Encounter (HOSPITAL_COMMUNITY)
Admission: RE | Admit: 2013-08-30 | Discharge: 2013-08-30 | Disposition: A | Discharge status: Home / Self Care | Payer: Medicare Other | Source: Ambulatory Visit | Attending: Emergency Medicine | Admitting: Emergency Medicine

## 2013-08-30 DIAGNOSIS — R911 Solitary pulmonary nodule: Secondary | ICD-10-CM | POA: Diagnosis not present

## 2013-08-30 DIAGNOSIS — J449 Chronic obstructive pulmonary disease, unspecified: Secondary | ICD-10-CM | POA: Insufficient documentation

## 2013-08-30 DIAGNOSIS — J4489 Other specified chronic obstructive pulmonary disease: Secondary | ICD-10-CM | POA: Insufficient documentation

## 2013-08-30 NOTE — Progress Notes (Signed)
Today, Charles [name] exercised at [facility]. Service time was from 10:30am to 12:30pm.  The patient exercised for more than 31 minutes performing aerobic, strengthening, and stretching exercises. Oxygen saturation, heart rate, blood pressure, rate of perceived exertion, and shortness of breath were all monitored before, during, and after exercise. Charles [name] presented with no problems at today's exercise session. Today the patient attended the education class "Oxygen Use and Safety" with Charles [name].  There was no workload change during today's exercise session.  Pre-exercise vitals:   Weight kg: 136.7   Liters of O2: ra   SpO2: 94   HR: 75   BP: 114/54   CBG: 181  Exercise vitals:   Highest heartrate:  66   Lowest oxygen saturation: 95   Highest blood pressure: 132/50   Liters of 02: ra  Post-exercise vitals:   SpO2: 96   HR: 79   BP: 120/64   Liters of O2: ra   CBG: 125  Dr. Kalman ShanMurali [name], Medical Director Dr. Gwenlyn PerkingMadera is immediately available during today's Pulmonary Rehab session [name] on 08/30/13 at 10:30am class time.

## 2013-09-04 ENCOUNTER — Encounter (HOSPITAL_COMMUNITY)
Admission: RE | Admit: 2013-09-04 | Discharge: 2013-09-04 | Disposition: A | Discharge status: Home / Self Care | Payer: Medicare Other | Source: Ambulatory Visit | Attending: Emergency Medicine | Admitting: Emergency Medicine

## 2013-09-04 DIAGNOSIS — R911 Solitary pulmonary nodule: Secondary | ICD-10-CM | POA: Diagnosis not present

## 2013-09-04 NOTE — Progress Notes (Signed)
Today, Charles [name] exercised at [facility]. Service time was from 10:30am to 12:15.  The patient exercised for more than 31 minutes performing aerobic, strengthening, and stretching exercises. Oxygen saturation, heart rate, blood pressure, rate of perceived exertion, and shortness of breath were all monitored before, during, and after exercise. Charles [name] presented with no problems at today's exercise session.   There was no workload change during today's exercise session.  Pre-exercise vitals:   Weight kg: 135.5   Liters of O2: ra   SpO2: 94   HR: 65   BP: 122/60   CBG: 150  Exercise vitals:   Highest heartrate:  77   Lowest oxygen saturation: 93   Highest blood pressure: 134/70   Liters of 02: ra  Post-exercise vitals:   SpO2: 93   HR: 61   BP: 100/52   Liters of O2: ra   CBG: 130  Dr. Kalman ShanMurali [name], Medical Director Dr. Gwenlyn [name] is immediately available during today's Pulmonary Rehab session for Charles [name] on 09/04/13 at 10:30am class time.

## 2013-09-06 ENCOUNTER — Encounter (HOSPITAL_COMMUNITY)
Admission: RE | Admit: 2013-09-06 | Discharge: 2013-09-06 | Disposition: A | Discharge status: Home / Self Care | Payer: Medicare Other | Source: Ambulatory Visit | Attending: Emergency Medicine | Admitting: Emergency Medicine

## 2013-09-06 DIAGNOSIS — R911 Solitary pulmonary nodule: Secondary | ICD-10-CM | POA: Diagnosis not present

## 2013-09-06 NOTE — Progress Notes (Signed)
Today, Charles [name] exercised at [facility]. Service time was from 1100 to 1230.  The patient exercised for more than 31 minutes performing aerobic, strengthening, and stretching exercises. Oxygen saturation, heart rate, blood pressure, rate of perceived exertion, and shortness of breath were all monitored before, during, and after exercise. Charles DalesReuben presented with no problems at today's exercise session. Patient attended M.D. lecture regarding pulmonary diseases.   There was one workload change during today's exercise session.  Pre-exercise vitals:   Weight kg: 135.1   Liters of O2: RA   SpO2: 93   HR: 65   BP: 128/58   CBG: 175  Exercise vitals:   Highest heartrate:  79   Lowest oxygen saturation: 93   Highest blood pressure: 130/58   Liters of 02: RA  Post-exercise vitals:   SpO2: 93   HR: 65   BP: 126/64   Liters of O2: RA   CBG: 136 Dr. Kalman ShanMurali Ramaswamy, Medical Director Dr. David StallFeliz-Ortiz is immediately available during today's Pulmonary Rehab session for [facility] [name] on 09/06/2013 at 1030 class time.

## 2013-09-11 ENCOUNTER — Encounter (HOSPITAL_COMMUNITY)
Admission: RE | Admit: 2013-09-11 | Discharge: 2013-09-11 | Disposition: A | Discharge status: Home / Self Care | Payer: Medicare Other | Source: Ambulatory Visit | Attending: Emergency Medicine | Admitting: Emergency Medicine

## 2013-09-11 DIAGNOSIS — R911 Solitary pulmonary nodule: Secondary | ICD-10-CM | POA: Diagnosis not present

## 2013-09-11 NOTE — Progress Notes (Signed)
Today, Charles [name] exercised at [facility]. Service time was from 1030 to 1230.  The patient exercised for more than 31 minutes performing aerobic, strengthening, and stretching exercises. Oxygen saturation, heart rate, blood pressure, rate of perceived exertion, and shortness of breath were all monitored before, during, and after exercise. [name] presented with no problems at today's exercise session.   There was no workload change during today's exercise session.  Pre-exercise vitals:   Weight kg: 137.1   Liters of O2: ra   SpO2: 94   HR: 66   BP: 114/68   CBG: 160  Exercise vitals:   Highest heartrate:  76   Lowest oxygen saturation: 93   Highest blood pressure: 126/70   Liters of 02: ra  Post-exercise vitals:   SpO2: 96   HR: 68   BP: 104/70   Liters of O2: ra   CBG: 153  Dr. Kalman ShanMurali Ramaswamy, Medical Director Dr. David StallFeliz-Ortiz is immediately available during today's Pulmonary Rehab session for [facility] on 09/11/2013 at 1030 class time.

## 2013-09-13 ENCOUNTER — Encounter (HOSPITAL_COMMUNITY)
Admission: RE | Admit: 2013-09-13 | Discharge: 2013-09-13 | Disposition: A | Discharge status: Home / Self Care | Payer: Medicare Other | Source: Ambulatory Visit | Attending: Emergency Medicine | Admitting: Emergency Medicine

## 2013-09-13 DIAGNOSIS — R911 Solitary pulmonary nodule: Secondary | ICD-10-CM | POA: Diagnosis not present

## 2013-09-13 NOTE — Progress Notes (Signed)
Today, [name] exercised at [facility]. Service time was from 1030 to 1230.  The patient exercised for more than 31 minutes performing aerobic, strengthening, and stretching exercises. Oxygen saturation, heart rate, blood pressure, rate of perceived exertion, and shortness of breath were all monitored before, during, and after exercise. Charles DalesReuben presented with no problems at today's exercise session.  Patient attended the nutrition for the pulmonary patient class today.   There was no workload change during today's exercise session.  Pre-exercise vitals:   Weight kg: 136.7   Liters of O2: RA   SpO2: 94   HR: 59   BP: 118/70   CBG: 140  Exercise vitals:   Highest heartrate:  71   Lowest oxygen saturation: 92   Highest blood pressure: 130/56   Liters of 02: RA  Post-exercise vitals:   SpO2: 97   HR: 60   BP: 108/68   Liters of O2: RA   CBG: 117 Dr. Kalman ShanMurali [name], Medical Director Dr. David StallFeliz-Ortiz is immediately available during today's Pulmonary Rehab session for [name] on 09/13/2013 at 1030 class time.

## 2013-09-18 ENCOUNTER — Encounter (HOSPITAL_COMMUNITY)
Admission: RE | Admit: 2013-09-18 | Discharge: 2013-09-18 | Disposition: A | Discharge status: Home / Self Care | Payer: Medicare Other | Source: Ambulatory Visit | Attending: Emergency Medicine | Admitting: Emergency Medicine

## 2013-09-18 DIAGNOSIS — R911 Solitary pulmonary nodule: Secondary | ICD-10-CM | POA: Diagnosis not present

## 2013-09-18 NOTE — Progress Notes (Signed)
Today, Charles [name] exercised at [facility]. Service time was from 1030 to 1215.  The patient exercised for more than 31 minutes performing aerobic, strengthening, and stretching exercises. Oxygen saturation, heart rate, blood pressure, rate of perceived exertion, and shortness of breath were all monitored before, during, and after exercise. Charles DalesReuben presented with no problems at today's exercise session.   There was a workload change during today's exercise session.  Pre-exercise vitals:   Weight kg: 135.7   Liters of O2: ra   SpO2: 94   HR: 66   BP: 128/74   CBG: 190  Exercise vitals:   Highest heartrate:  61   Lowest oxygen saturation: 93   Highest blood pressure: 122/56   Liters of 02: ra  Post-exercise vitals:   SpO2: 96   HR: 62   BP: 118/72   Liters of O2: ra   CBG: 109  Charles [name], Medical Director Charles [name] is immediately available during today's Pulmonary Rehab session for [name] on 09/18/2013 at 1030 class time.

## 2013-09-20 ENCOUNTER — Encounter (HOSPITAL_COMMUNITY): Payer: Medicare Other

## 2013-09-25 ENCOUNTER — Encounter (HOSPITAL_COMMUNITY): Payer: Medicare Other

## 2013-09-27 ENCOUNTER — Encounter (HOSPITAL_COMMUNITY): Payer: Medicare Other

## 2013-10-02 ENCOUNTER — Encounter (HOSPITAL_COMMUNITY)
Admission: RE | Admit: 2013-10-02 | Discharge: 2013-10-02 | Disposition: A | Discharge status: Home / Self Care | Payer: Medicare Other | Source: Ambulatory Visit | Attending: Emergency Medicine | Admitting: Emergency Medicine

## 2013-10-02 DIAGNOSIS — R911 Solitary pulmonary nodule: Secondary | ICD-10-CM | POA: Diagnosis present

## 2013-10-02 DIAGNOSIS — J4489 Other specified chronic obstructive pulmonary disease: Secondary | ICD-10-CM | POA: Insufficient documentation

## 2013-10-02 DIAGNOSIS — J449 Chronic obstructive pulmonary disease, unspecified: Secondary | ICD-10-CM | POA: Diagnosis present

## 2013-10-02 NOTE — Progress Notes (Signed)
Today, Charles [name] exercised at [facility]. Service time was from 1030 to 1200.  The patient exercised for more than 31 minutes performing aerobic, strengthening, and stretching exercises. Oxygen saturation, heart rate, blood pressure, rate of perceived exertion, and shortness of breath were all monitored before, during, and after exercise. Charles [name] presented with no problems at today's exercise session.   There was no workload change during today's exercise session.  Pre-exercise vitals:   Weight kg: 133.3   Liters of O2: RA   SpO2: 95   HR: 81   BP: 136/60   CBG: 139  Exercise vitals:   Highest heartrate:  98   Lowest oxygen saturation: 92   Highest blood pressure: 144/54   Liters of 02: RA  Post-exercise vitals:   SpO2: 94   HR: 87   BP: 132/50   Liters of O2: RA   CBG: 151 Charles [name], Medical Director Charles [name] is immediately available during today's Pulmonary Rehab session for [name] on 10/02/2013 at 1030 class time.

## 2013-10-04 ENCOUNTER — Encounter (HOSPITAL_COMMUNITY)
Admission: RE | Admit: 2013-10-04 | Discharge: 2013-10-04 | Disposition: A | Discharge status: Home / Self Care | Payer: Medicare Other | Source: Ambulatory Visit | Attending: Emergency Medicine | Admitting: Emergency Medicine

## 2013-10-04 DIAGNOSIS — R911 Solitary pulmonary nodule: Secondary | ICD-10-CM | POA: Diagnosis not present

## 2013-10-04 NOTE — Progress Notes (Signed)
Today, Jkai exercised at [facility]. Service time was from 10:30 to 12:30.  The patient exercised for more than 31 minutes performing aerobic, strengthening, and stretching exercises. Oxygen saturation, heart rate, blood pressure, rate of perceived exertion, and shortness of breath were all monitored before, during, and after exercise. [name] presented with no problems at today's exercise session. The patient attended Advanced Directives class with Theda BelfastBob Hamilton.   There was no workload change during today's exercise session.  Pre-exercise vitals:   Weight kg: 132.7   Liters of O2: ra   SpO2: 94   HR: 68   BP: 100/50   CBG: 149  Exercise vitals:   Highest heartrate:  79   Lowest oxygen saturation: 94   Highest blood pressure: 136/60   Liters of 02: ra  Post-exercise vitals:   SpO2: 95   HR: 66   BP: 112/56   Liters of O2: ra   CBG: 129  Dr. Kalman ShanMurali Ramaswamy, Medical Director Dr. Carmell Austriaegaldo is immediately available during today's Pulmonary Rehab session for [facility] Willner on 10/04/13 at 10:30am class time.

## 2013-10-09 ENCOUNTER — Encounter (HOSPITAL_COMMUNITY)
Admission: RE | Admit: 2013-10-09 | Discharge: 2013-10-09 | Disposition: A | Discharge status: Home / Self Care | Payer: Medicare Other | Source: Ambulatory Visit | Attending: Emergency Medicine | Admitting: Emergency Medicine

## 2013-10-09 DIAGNOSIS — R911 Solitary pulmonary nodule: Secondary | ICD-10-CM | POA: Diagnosis not present

## 2013-10-09 NOTE — Progress Notes (Signed)
Today, Erion exercised at [facility]. Service time was from 10:30am to 12:15pm.  The patient exercised for more than 31 minutes performing aerobic, strengthening, and stretching exercises. Oxygen saturation, heart rate, blood pressure, rate of perceived exertion, and shortness of breath were all monitored before, during, and after exercise. [name] presented with no problems at today's exercise session.   There was no workload change during today's exercise session.  Pre-exercise vitals:   Weight kg: 133.3   Liters of O2: ra   SpO2: 95   HR: 65   BP: 122/54   CBG: 127  Exercise vitals:   Highest heartrate:  75   Lowest oxygen saturation: 95   Highest blood pressure: 132/56   Liters of 02: ra  Post-exercise vitals:   SpO2: 95   HR: 58   BP: 110/40   Liters of O2: ra   CBG: 151  Dr. Kalman ShanMurali Ramaswamy, Medical Director Dr. Carmell Austriaegaldo is immediately available during today's Pulmonary Rehab session for [facility]low[facility] on 10/09/13 at 10:30am class time.

## 2013-10-11 ENCOUNTER — Encounter (HOSPITAL_COMMUNITY)
Admission: RE | Admit: 2013-10-11 | Discharge: 2013-10-11 | Disposition: A | Discharge status: Home / Self Care | Payer: Medicare Other | Source: Ambulatory Visit | Attending: Emergency Medicine | Admitting: Emergency Medicine

## 2013-10-11 DIAGNOSIS — R911 Solitary pulmonary nodule: Secondary | ICD-10-CM | POA: Diagnosis not present

## 2013-10-11 NOTE — Progress Notes (Signed)
Today, Charles [name] exercised at [facility]. Service time was from 10:30am to 12:30pm.  The patient exercised for more than 31 minutes performing aerobic, strengthening, and stretching exercises. Oxygen saturation, heart rate, blood pressure, rate of perceived exertion, and shortness of breath were all monitored before, during, and after exercise. Charles DalesReuben presented with no problems at today's exercise session. The patient attended Pulmonary Medications education class today with [name]  There was no workload change during today's exercise session.  Pre-exercise vitals:   Weight kg: 133.3   Liters of O2: ra   SpO2: 95   HR: 77   BP: 102/64   CBG: 151  Exercise vitals:   Highest heartrate:  66   Lowest oxygen saturation: 92   Highest blood pressure: 118/70   Liters of 02: ra  Post-exercise vitals:   SpO2: 91   HR: 50   BP: 114/64   Liters of O2: ra   CBG: 156  Dr. Kalman ShanMurali [name], Medical Director Dr. Rito [name] is immediately available during today's Pulmonary Rehab session for [name] on 10/11/13 at 10:30am class time.

## 2013-10-16 ENCOUNTER — Encounter (HOSPITAL_COMMUNITY)
Admission: RE | Admit: 2013-10-16 | Discharge: 2013-10-16 | Disposition: A | Discharge status: Home / Self Care | Payer: Medicare Other | Source: Ambulatory Visit | Attending: Emergency Medicine | Admitting: Emergency Medicine

## 2013-10-16 DIAGNOSIS — R911 Solitary pulmonary nodule: Secondary | ICD-10-CM | POA: Diagnosis not present

## 2013-10-16 NOTE — Progress Notes (Signed)
Today, Jerron exercised at [facility]. Service time was from 1030 to 1200.  The patient exercised for more than 31 minutes performing aerobic, strengthening, and stretching exercises. Oxygen saturation, heart rate, blood pressure, rate of perceived exertion, and shortness of breath were all monitored before, during, and after exercise. [name] presented with no problems at today's exercise session.   There was no workload change during today's exercise session.  Pre-exercise vitals:   Weight kg: 132.0   Liters of O2: RA   SpO2: 93   HR: 66   BP: 122/64   CBG: 100  Exercise vitals:   Highest heartrate:  65   Lowest oxygen saturation: 95   Highest blood pressure: 134/60   Liters of 02: RA  Post-exercise vitals:   SpO2: 94   HR: 61   BP: 100/54   Liters of O2: RA   CBG: 147 Dr. Kalman ShanMurali Ramaswamy, Medical Director Dr. Rito EhrlichKrishnan is immediately available during today's Pulmonary Rehab session for [facility] on 10/16/2013 at 1030 class time.

## 2013-10-18 ENCOUNTER — Encounter (HOSPITAL_COMMUNITY)
Admission: RE | Admit: 2013-10-18 | Discharge: 2013-10-18 | Disposition: A | Discharge status: Home / Self Care | Payer: Medicare Other | Source: Ambulatory Visit | Attending: Emergency Medicine | Admitting: Emergency Medicine

## 2013-10-18 DIAGNOSIS — R911 Solitary pulmonary nodule: Secondary | ICD-10-CM | POA: Diagnosis not present

## 2013-10-18 NOTE — Progress Notes (Signed)
Charles DalesReuben [name] 73 y.o. male Nutrition Note Spoke with pt. Pt well-known to this Clinical research associatewriter from previous admissions. Pt is obese.  There are some ways the pt can change his eating habits to make healthier choices.  Pt's Rate Your Plate results reviewed with pt. Pt does not avoid salty food; uses canned/ convenience food.  Pt does not add salt to food. Pt is diabetic. Pt reports his last A1c was "7.5 probably 3 months ago." Pt states his vision has significantly declined over the past year. Pt states he forgets to take his insulin before breakfast and before dinner "several times a week." Pt describes measuring insulin as cumbersome. Pt has had a Child psychotherapistsocial worker for the blind by his home last week and resources for the blind discussed. Pt is currently waiting to hear from some of the individuals that can help him with his needs. Pt expressed understanding of the information reviewed. Nutrition Diagnosis   Food-and nutrition-related knowledge deficit related to lack of exposure to information as related to diagnosis of pulmonary disease   Obesity related to excessive energy intake as evidenced by a BMI of 39.6    Nutrition Rx/Est. Daily Nutrition Needs for: ? wt loss 1800-2300 Kcal  110-135 gm protein   1500 mg or less sodium      Nutrition Intervention   Pt's individual nutrition plan and goals reviewed with pt.   Benefits of adopting healthy eating habits discussed when pt's Rate Your Plate reviewed.   Pt to attend the Nutrition and Lung Disease class   Continual client-centered nutrition education by RD, as part of interdisciplinary care. Goal(s) 1. Pt to identify and limit food sources of sodium. 2. Identify food quantities necessary to achieve wt loss 3. CBG's in the normal range or as close to normal as is safely possible. Monitor and Evaluate progress toward nutrition goal with team.   Mickle PlumbEdna Megyn [name], M.Ed, RD, LDN, CDE 10/18/2013 12:19 PM

## 2013-10-18 NOTE — Progress Notes (Signed)
Today, Tag exercised at [facility]. Service time was from 1030 to 1230.  The patient exercised for more than 31 minutes performing aerobic, strengthening, and stretching exercises. Oxygen saturation, heart rate, blood pressure, rate of perceived exertion, and shortness of breath were all monitored before, during, and after exercise. Charles [name] presented with no problems at today's exercise session. Charles [name] also attended an education session on home oxygen.  There was no workload change during today's exercise session.  Pre-exercise vitals:   Weight kg: 133.1   Liters of O2: ra   SpO2: 94   HR: 56   BP: 118/54   CBG: 180  Exercise vitals:   Highest heartrate:  72   Lowest oxygen saturation: 93   Highest blood pressure: 116/74   Liters of 02: ra  Post-exercise vitals:   SpO2: 94   HR: 60   BP: 108/62   Liters of O2: ra   CBG: 158  Dr. Kalman ShanMurali Ramaswamy, Medical Director Dr. Waymon AmatoHongalgi is immediately available during today's Pulmonary Rehab session for [name] on 10/18/2013 at 1030 class time.

## 2013-10-23 ENCOUNTER — Encounter (HOSPITAL_COMMUNITY)
Admission: RE | Admit: 2013-10-23 | Discharge: 2013-10-23 | Disposition: A | Discharge status: Home / Self Care | Payer: Medicare Other | Source: Ambulatory Visit | Attending: Emergency Medicine | Admitting: Emergency Medicine

## 2013-10-23 DIAGNOSIS — R911 Solitary pulmonary nodule: Secondary | ICD-10-CM | POA: Diagnosis not present

## 2013-10-23 NOTE — Progress Notes (Signed)
Today, [name] Service time was from 1030 to 1215.  The patient exercised for more than 31 minutes performing aerobic, strengthening, and stretching exercises. Oxygen saturation, heart rate, blood pressure, rate of perceived exertion, and shortness of breath were all monitored before, during, and after exercise. Charles [name] presented with no problems at today's exercise session.   There was no workload change during today's exercise session.  Pre-exercise vitals:   Weight kg: 134.2   Liters of O2: ra   SpO2: 95   HR: 61   BP: 120/50   CBG: 95 at breakfast and 110 prior to exercise  Exercise vitals:   Highest heartrate:  85   Lowest oxygen saturation: 92   Highest blood pressure: 100/54   Liters of 02: ra  Post-exercise vitals:   SpO2: 93   HR: 59   BP: 126/60   Liters of O2: ra   CBG: na  Dr. Kalman Shan, Medical Director Dr. Waymon Amato is immediately available during today's Pulmonary Rehab session for [facility] on 10/23/2013 at 1030 class time.

## 2013-10-25 ENCOUNTER — Encounter (HOSPITAL_COMMUNITY)
Admission: RE | Admit: 2013-10-25 | Discharge: 2013-10-25 | Disposition: A | Discharge status: Home / Self Care | Payer: Medicare Other | Source: Ambulatory Visit | Attending: Emergency Medicine | Admitting: Emergency Medicine

## 2013-10-25 DIAGNOSIS — R911 Solitary pulmonary nodule: Secondary | ICD-10-CM | POA: Diagnosis not present

## 2013-10-25 NOTE — Progress Notes (Signed)
Today, Charles [name] exercised at [facility]. Service time was from 1100 to 1315.  The patient exercised for more than 31 minutes performing aerobic, strengthening, and stretching exercises. Oxygen saturation, heart rate, blood pressure, rate of perceived exertion, and shortness of breath were all monitored before, during, and after exercise. Charles [name] presented with no problems at today's exercise session. Derik also attended a q and a session with the pulmonary MD.   There was no workload change during today's exercise session.  Pre-exercise vitals:   Weight kg: 133.9   Liters of O2: ra   SpO2: 94   HR: 60   BP: 122/64   CBG: 125  Exercise vitals:   Highest heartrate:  60   Lowest oxygen saturation: 94   Highest blood pressure: 122/62   Liters of 02: ra  Post-exercise vitals:   SpO2: 95   HR: 55   BP: 122/60   Liters of O2: ra   CBG: 129  Dr. Kalman Shan, Medical Director [name] is immediately available during today's Pulmonary Rehab session for [facility] on 10/25/2013 at 1100 class time.

## 2013-10-30 ENCOUNTER — Encounter (HOSPITAL_COMMUNITY)
Admission: RE | Admit: 2013-10-30 | Discharge: 2013-10-30 | Disposition: A | Discharge status: Home / Self Care | Payer: Medicare Other | Source: Ambulatory Visit | Attending: Emergency Medicine | Admitting: Emergency Medicine

## 2013-10-30 DIAGNOSIS — J449 Chronic obstructive pulmonary disease, unspecified: Secondary | ICD-10-CM | POA: Insufficient documentation

## 2013-10-30 DIAGNOSIS — R911 Solitary pulmonary nodule: Secondary | ICD-10-CM | POA: Diagnosis not present

## 2013-10-30 DIAGNOSIS — J4489 Other specified chronic obstructive pulmonary disease: Secondary | ICD-10-CM | POA: Insufficient documentation

## 2013-10-30 NOTE — Progress Notes (Signed)
Today, Charles [name] exercised at [facility]. Service time was from 1030 to 1215.  The patient exercised for more than 31 minutes performing aerobic, strengthening, and stretching exercises. Oxygen saturation, heart rate, blood pressure, rate of perceived exertion, and shortness of breath were all monitored before, during, and after exercise. Charles [name] presented with no problems at today's exercise session.   There was no workload change during today's exercise session.  Pre-exercise vitals:   Weight kg: 133.7   Liters of O2: ra   SpO2: 95   HR: 70   BP: 134/48   CBG: 210  Exercise vitals:   Highest heartrate:  76   Lowest oxygen saturation: 89 up to 93 with PLB   Highest blood pressure: 128/66   Liters of 02: ra  Post-exercise vitals:   SpO2: 91   HR: 55   BP: 104/64   Liters of O2: ra   CBG: 225  Dr. Kalman Shan, Medical Director [name] is immediately available during today's Pulmonary Rehab session for [facility] on 10/30/2013 at 1030 class time.

## 2013-11-01 ENCOUNTER — Encounter (HOSPITAL_COMMUNITY): Payer: Medicare Other

## 2013-11-01 ENCOUNTER — Telehealth (HOSPITAL_COMMUNITY): Payer: Self-pay | Admitting: Internal Medicine

## 2013-11-06 ENCOUNTER — Encounter (HOSPITAL_COMMUNITY): Payer: Medicare Other

## 2013-11-08 ENCOUNTER — Encounter (HOSPITAL_COMMUNITY)
Admission: RE | Admit: 2013-11-08 | Discharge: 2013-11-08 | Disposition: A | Discharge status: Home / Self Care | Payer: Medicare Other | Source: Ambulatory Visit | Attending: Emergency Medicine | Admitting: Emergency Medicine

## 2013-11-08 DIAGNOSIS — R911 Solitary pulmonary nodule: Secondary | ICD-10-CM | POA: Diagnosis not present

## 2013-11-08 NOTE — Progress Notes (Signed)
Today, Charles [name] exercised at [facility]. Service time was from 10:30 to 12:30.  The patient exercised for more than 31 minutes performing aerobic, strengthening, and stretching exercises. Oxygen saturation, heart rate, blood pressure, rate of perceived exertion, and shortness of breath were all monitored before, during, and after exercise. Charles [name] presented with no problems at today's exercise session.  The patient attended education class on Nutrition with Mickle Plumb.  There was an increase in workload change during today's exercise session.  Pre-exercise vitals:   Weight kg: 136.1   Liters of O2: ra   SpO2: 95   HR: 57   BP: 102/66   CBG: 166  Exercise vitals:   Highest heartrate:  84   Lowest oxygen saturation: 95   Highest blood pressure: 126/64   Liters of 02: ra  Post-exercise vitals:   SpO2: 96   HR: 56   BP: 128/60   Liters of O2: ra   CBG: na  Dr. Kalman Shan, Medical Director Dr. Vanessa Barbara is immediately available during today's Pulmonary Rehab session for [facility] on 11/08/13 at 10:30am class time.

## 2013-11-13 ENCOUNTER — Encounter (HOSPITAL_COMMUNITY): Payer: Medicare Other

## 2013-11-15 ENCOUNTER — Encounter (HOSPITAL_COMMUNITY): Payer: Medicare Other

## 2013-11-20 ENCOUNTER — Encounter (HOSPITAL_COMMUNITY)
Admission: RE | Admit: 2013-11-20 | Discharge: 2013-11-20 | Disposition: A | Discharge status: Home / Self Care | Payer: Medicare Other | Source: Ambulatory Visit | Attending: Emergency Medicine | Admitting: Emergency Medicine

## 2013-11-20 DIAGNOSIS — R911 Solitary pulmonary nodule: Secondary | ICD-10-CM | POA: Diagnosis not present

## 2013-11-20 NOTE — Progress Notes (Signed)
Today, Finneus exercised at [facility]. Service time was from 1030 to 1215.  The patient exercised for more than 31 minutes performing aerobic, strengthening, and stretching exercises. Oxygen saturation, heart rate, blood pressure, rate of perceived exertion, and shortness of breath were all monitored before, during, and after exercise. Charles [name] presented with no problems at today's exercise session.   There was no workload change during today's exercise session.  Pre-exercise vitals:   Weight kg: 133.6   Liters of O2: RA   SpO2: 94   HR: 68   BP: 132/60   CBG: 85 before exercise, given a snack of crackers, banana, and lemonade.  Recheck CBG 144   Exercise vitals:   Highest heartrate:  76   Lowest oxygen saturation: 95   Highest blood pressure: 122/70   Liters of 02: RA  Post-exercise vitals:   SpO2: 95   HR: 74   BP: 122/62   Liters of O2: RA   CBG: 115 Dr. Kalman Shan, Medical Director Dr. Rhona Leavens is immediately available during today's Pulmonary Rehab session for [facility] on 11/20/2013 at 1030 class time.

## 2013-11-22 ENCOUNTER — Encounter (HOSPITAL_COMMUNITY)
Admission: RE | Admit: 2013-11-22 | Discharge: 2013-11-22 | Disposition: A | Discharge status: Home / Self Care | Payer: Medicare Other | Source: Ambulatory Visit | Attending: Emergency Medicine | Admitting: Emergency Medicine

## 2013-11-22 DIAGNOSIS — R911 Solitary pulmonary nodule: Secondary | ICD-10-CM | POA: Diagnosis not present

## 2013-11-22 NOTE — Progress Notes (Signed)
Today, Charles [name] exercised at [facility]. Service time was from 10:30am to 12:30pm.  The patient exercised for more than 31 minutes performing aerobic, strengthening, and stretching exercises. Oxygen saturation, heart rate, blood pressure, rate of perceived exertion, and shortness of breath were all monitored before, during, and after exercise. [name] presented with no problems at today's exercise session. The patient attended education class with Haskel Khan on "Warning Signs and Symptoms".   There was no workload change during today's exercise session.  Pre-exercise vitals:   Weight kg: 133.3   Liters of O2: ra   SpO2: 94   HR: 61   BP: 100/60   CBG: 140  Exercise vitals:   Highest heartrate:  69   Lowest oxygen saturation: 96   Highest blood pressure: 128/72   Liters of 02: ra  Post-exercise vitals:   SpO2: 95   HR: 58   BP: 108/70   Liters of O2: ra   CBG: nq  Dr. Kalman Shan, Medical Director Dr. Gwenlyn Perking is immediately available during today's Pulmonary Rehab session for [facility] on 11/22/13 at 10:30am class time.

## 2013-11-27 ENCOUNTER — Encounter (HOSPITAL_COMMUNITY)
Admission: RE | Admit: 2013-11-27 | Discharge: 2013-11-27 | Disposition: A | Discharge status: Home / Self Care | Payer: Medicare Other | Source: Ambulatory Visit | Attending: Emergency Medicine | Admitting: Emergency Medicine

## 2013-11-27 DIAGNOSIS — R911 Solitary pulmonary nodule: Secondary | ICD-10-CM | POA: Diagnosis not present

## 2013-11-27 NOTE — Progress Notes (Signed)
Today, Davinder [facility] at [facility]. Service time was from 1030 to 1215.  The patient exercised for more than 31 minutes performing aerobic, strengthening, and stretching exercises. Oxygen saturation, heart rate, blood pressure, rate of perceived exertion, and shortness of breath were all monitored before, during, and after exercise. [name] presented with no problems at today's exercise session.   There was no workload change during today's exercise session.  Pre-exercise vitals:   Weight kg: 134.5   Liters of O2: ra   SpO2: 95   HR: 59   BP: 114/60   CBG: 135  Exercise vitals:   Highest heartrate:  73   Lowest oxygen saturation: 92   Highest blood pressure: 140/50   Liters of 02: ra  Post-exercise vitals:   SpO2: 96   HR: 59   BP: 124/70   Liters of O2: ra   CBG: 115  Dr. Kalman ShanMurali Ramaswamy, Medical Director Dr. Gwenlyn PerkingMadera is immediately available during today's Pulmonary Rehab session for [facility] on 11/27/2013 at 1030 class time.

## 2013-11-29 ENCOUNTER — Ambulatory Visit (INDEPENDENT_AMBULATORY_CARE_PROVIDER_SITE_OTHER): Payer: Medicare Other

## 2013-11-29 ENCOUNTER — Encounter (HOSPITAL_COMMUNITY)
Admission: RE | Admit: 2013-11-29 | Discharge: 2013-11-29 | Disposition: A | Discharge status: Home / Self Care | Payer: Medicare Other | Source: Ambulatory Visit | Attending: Emergency Medicine | Admitting: Emergency Medicine

## 2013-11-29 DIAGNOSIS — Z5189 Encounter for other specified aftercare: Secondary | ICD-10-CM | POA: Insufficient documentation

## 2013-11-29 DIAGNOSIS — G4730 Sleep apnea, unspecified: Secondary | ICD-10-CM | POA: Insufficient documentation

## 2013-11-29 DIAGNOSIS — R911 Solitary pulmonary nodule: Secondary | ICD-10-CM | POA: Insufficient documentation

## 2013-11-29 DIAGNOSIS — Z23 Encounter for immunization: Secondary | ICD-10-CM

## 2013-11-29 DIAGNOSIS — J449 Chronic obstructive pulmonary disease, unspecified: Secondary | ICD-10-CM | POA: Diagnosis not present

## 2013-11-29 NOTE — Progress Notes (Signed)
Charles [name] completed a Six-Minute Walk Test on 11/29/13 . [name] walked 697 feet with 0 breaks.  The patient's lowest oxygen saturation was 91 , highest heart rate was 117 bpm , and highest blood pressure was 130/60. The patient was on room air. Charles [name] stated that hip pain hindered their walk test.

## 2013-11-30 NOTE — Progress Notes (Signed)
Discharge Note: Patient discharged from pulmonary rehab after completing the program. Charles [name] successfully attended 23 sessions of rehab and significantly improved his stamina and strength. Charles [name] did not have as significant amount of weight loss as he had hoped, but remains positive about the program and his progress. He is scheduled to begin the pulmonary maintenance program [facility]. He also improved his knowledge of how his sleep apnea and compliance with his cpap affected his body.Charles [name] also begin to utilize services for the blind while he was attending the program as a result of his deteriating eye sight and took advantage of having an advanced directive completed. His discharge PHQ score was 0.

## 2013-12-07 ENCOUNTER — Telehealth: Payer: Self-pay | Admitting: Emergency Medicine

## 2013-12-07 ENCOUNTER — Encounter (HOSPITAL_COMMUNITY)
Admission: RE | Admit: 2013-12-07 | Discharge: 2013-12-07 | Disposition: A | Discharge status: Home / Self Care | Payer: Medicare Other | Source: Ambulatory Visit | Attending: Emergency Medicine | Admitting: Emergency Medicine

## 2013-12-07 NOTE — Telephone Encounter (Signed)
Charles BumpsJessica have you seen this form come through on this pt for pulmonary rehab?  thanks

## 2013-12-07 NOTE — Telephone Encounter (Signed)
No, I have not received anything on this patient for RB Will need to call pulm rehab week of 10/12 to see if this can be refaxed

## 2013-12-10 NOTE — Telephone Encounter (Signed)
Charles [name] is calling back regarding this order & can be reached at  [phone].  Will re-fax this.  Requests this ASAP.  Charles [name]

## 2013-12-10 NOTE — Telephone Encounter (Signed)
LMTCB x1 for Charles [name]. RB not back in the office until tomorrow afternoon. whoch fax did she send this to?

## 2013-12-10 NOTE — Telephone Encounter (Signed)
Charles [name] returned call. Kirt BoysMolly stated she would refax this form to the triage fax. Charles [name] aware that RB is not back in the office till 10/13. Form received and given to Marcelino DusterMichelle who is working with RB on 10/13.   Will route back to myself to f/u on.

## 2013-12-12 ENCOUNTER — Telehealth: Payer: Self-pay | Admitting: Internal Medicine

## 2013-12-12 MED ORDER — GLUCOSE BLOOD VI STRP: ORAL_STRIP | Status: DC

## 2013-12-12 NOTE — Telephone Encounter (Deleted)
Patient need refill for test strips, True results test meter, please fax to pharmacy. Thank you

## 2013-12-12 NOTE — Telephone Encounter (Signed)
Patient notified

## 2013-12-12 NOTE — Telephone Encounter (Signed)
Refill done as requested 

## 2013-12-12 NOTE — Telephone Encounter (Signed)
Patient need refill for test strips, True results test meter, please fax to pharmacy. Thank you °

## 2013-12-13 MED ORDER — TRUETRACK BLOOD GLUCOSE W/DEVICE KIT: PACK | Status: DC

## 2013-12-13 MED ORDER — GLUCOSE BLOOD VI STRP: ORAL_STRIP | Status: DC

## 2013-12-13 NOTE — Telephone Encounter (Signed)
Spoke with Marcelino DusterMichelle. [facility] signed form back on 12/12/13 and placed in scan folder. Nothing further needed.

## 2013-12-13 NOTE — Telephone Encounter (Signed)
Done

## 2013-12-17 ENCOUNTER — Encounter (HOSPITAL_COMMUNITY)
Admission: RE | Admit: 2013-12-17 | Discharge: 2013-12-17 | Disposition: A | Discharge status: Home / Self Care | Payer: Medicare Other | Source: Ambulatory Visit | Attending: Emergency Medicine | Admitting: Emergency Medicine

## 2013-12-19 ENCOUNTER — Encounter (HOSPITAL_COMMUNITY)
Admission: RE | Admit: 2013-12-19 | Discharge: 2013-12-19 | Disposition: A | Discharge status: Home / Self Care | Payer: Medicare Other | Source: Ambulatory Visit | Attending: Emergency Medicine | Admitting: Emergency Medicine

## 2013-12-21 ENCOUNTER — Encounter (HOSPITAL_COMMUNITY): Payer: Medicare Other

## 2013-12-24 ENCOUNTER — Encounter (HOSPITAL_COMMUNITY)
Admission: RE | Admit: 2013-12-24 | Discharge: 2013-12-24 | Disposition: A | Discharge status: Home / Self Care | Payer: Medicare Other | Source: Ambulatory Visit | Attending: Emergency Medicine | Admitting: Emergency Medicine

## 2013-12-26 ENCOUNTER — Encounter (HOSPITAL_COMMUNITY)
Admission: RE | Admit: 2013-12-26 | Discharge: 2013-12-26 | Disposition: A | Discharge status: Home / Self Care | Payer: Medicare Other | Source: Ambulatory Visit | Attending: Emergency Medicine | Admitting: Emergency Medicine

## 2013-12-28 ENCOUNTER — Encounter (HOSPITAL_COMMUNITY)
Admission: RE | Admit: 2013-12-28 | Discharge: 2013-12-28 | Disposition: A | Discharge status: Home / Self Care | Payer: Medicare Other | Source: Ambulatory Visit | Attending: Emergency Medicine | Admitting: Emergency Medicine

## 2013-12-31 ENCOUNTER — Encounter (HOSPITAL_COMMUNITY)
Admission: RE | Admit: 2013-12-31 | Discharge: 2013-12-31 | Disposition: A | Discharge status: Home / Self Care | Payer: Self-pay | Source: Ambulatory Visit | Attending: Emergency Medicine | Admitting: Emergency Medicine

## 2013-12-31 DIAGNOSIS — J449 Chronic obstructive pulmonary disease, unspecified: Secondary | ICD-10-CM | POA: Insufficient documentation

## 2013-12-31 DIAGNOSIS — G4730 Sleep apnea, unspecified: Secondary | ICD-10-CM | POA: Insufficient documentation

## 2013-12-31 DIAGNOSIS — Z5189 Encounter for other specified aftercare: Secondary | ICD-10-CM | POA: Insufficient documentation

## 2013-12-31 DIAGNOSIS — R911 Solitary pulmonary nodule: Secondary | ICD-10-CM | POA: Insufficient documentation

## 2014-01-01 ENCOUNTER — Encounter: Payer: Self-pay | Admitting: Emergency Medicine

## 2014-01-01 ENCOUNTER — Ambulatory Visit (INDEPENDENT_AMBULATORY_CARE_PROVIDER_SITE_OTHER): Payer: Medicare Other | Admitting: Emergency Medicine

## 2014-01-01 VITALS — BP 124/72 | HR 56 | Temp 97.4°F | Ht 74.0 in | Wt 299.4 lb

## 2014-01-01 DIAGNOSIS — Z9989 Dependence on other enabling machines and devices: Secondary | ICD-10-CM

## 2014-01-01 DIAGNOSIS — G4733 Obstructive sleep apnea (adult) (pediatric): Secondary | ICD-10-CM

## 2014-01-01 DIAGNOSIS — J449 Chronic obstructive pulmonary disease, unspecified: Secondary | ICD-10-CM

## 2014-01-01 NOTE — Patient Instructions (Addendum)
Continue your pulmonary rehab Wear CPAP every night Continue your mucinex D as needed Use your albuterol 2 puff as needed for shortness of breath Follow with Dr Delton CoombesByrum in 6 months or sooner if you have any problems Try getting generic for Mucinex >> guaifenesin + dextromethorphan

## 2014-01-01 NOTE — Progress Notes (Signed)
73 yo former smoker, hx obesity, DM, HTN, OSA on CPAP 8. Also hx gallstone pancreatitis s/p chole in the past. Dx with COPD by pulmonologist in [place] many yrs ago. Has been on combivent, more recently Spiriva + ProAir. He doesn't take the Spiriva daily, doesn't see that it helps him. he does use ProAir, feels it helps him clear sputum. Also was followed for pulm nodule, he reports was stable on serial CTs over 5 yrs.  Able to walk around the house, but has to stop to rest if he walks over a block, climbs stairs. His last PFTs were done 6 -7 mo ago in [place]. Good compliance w CPAP, pressure never rechecked since '97.   ROV 02/11/10 -- follows up today for OSA/OHS, COPD. Last time we stopped Spiriva because he was only using as needed. He occas uses ProAir (about qod), helps him clear phlegm. Auto-titration CPAP study performed and shows that optimal CPAP is 14cmH2O (currently set on 8). CT scans available for review dating back to 2005 - There has been very little change up to last one done in 11/01/08: In fact it looks smaller to me (1.6x1.5cm from 1.8x1.5).   ROV 08/25/10 -- OSA/OHS, COPD. He is tolerating CPAP 14cmH2O. He is doing well, using ProAir prn - uses it when he is congested, very rare. He gets SOB with exertion, moves slowly,able to do projects around the house, work in the yard. He is happy w the breathing meds right now.   ROV 03/25/11 --  OSA/OHS, COPD. Regular f/u. We tried Symbicort last time to see if he would benefit. He took it for a week and then stopped. He didn't notice much difference. He uses ProAir but rarely. He is still having chest congestion, daily phlegm.   ROV 05/03/11 --  OSA/OHS, COPD. Has not benefited in past from long-acting meds, but last time we restarted Symbicort to see if it would help. He believes that his cough and chest congestion are both better. Still with significant exertional SOB. Good compliance w CPAP.   ROV 06/21/11 -- OSA/OHS, COPD. He had TTE since last time to  screen for PAH > shows some LAE, normal RV size and fxn (05/13/11). He remains on CPAP. He is using symbicort, probably some improvement.   ROV 10/22/11 -- OSA/OHS, COPD. He is doing CPAP, has benefited from pulm rehab, has lost 10 lbs. He is on Symbicort, but stopped doing it regularly. He does have SABA but doesn't use regularly.   ROV 06/21/13 -- OSA/OHS on CPAP, COPD. Returns for f/u today. He has albuterol available for prn use. He is not on scheduled BD's - used to be on spiriva and symbicort. He has a lot of cough and clear mucous year round. Some difficulty getting it out. His exercise if limited - very sedentary lifestyle. He just got a new CPAP mask, doing well with it. Interested in doing pulm rehab again.   ROV 01/01/14 -- OSA/OHS on CPAP, COPD. He has lost weight and has been doing pulm rehab. He has had continued difficulty w mucous production and cough. Has used mucinex D. Wears his CPAP mask reliably.    Filed Vitals:   01/01/14 1342  BP: 124/72  Pulse: 56  Temp: 97.4 F (36.3 C)  TempSrc: Oral  Height: 6\' 2"  (1.88 m)  Weight: 299 lb 6.4 oz (135.807 kg)  SpO2: 95%   Gen: Pleasant, obese man, in no distress,  normal affect  ENT: No lesions,  mouth clear,  oropharynx clear, no postnasal drip  Neck: No JVD, no TMG, no carotid bruits  Lungs: No use of accessory muscles, no dullness to percussion, clear without rales or rhonchi  Cardiovascular: RRR, heart sounds normal, no murmur or gallops, no peripheral edema  Musculoskeletal: No deformities, no cyanosis or clubbing  Neuro: alert, non focal  Skin: Warm, no lesions or rashes   COPD (chronic obstructive pulmonary disease) He continues to make mucous. Has not been on a scheduled BD, did not benefit from spiriva or symbicort. He uses SABA occasionally. Has not been using flutter frequently.  - continue albuterol prn - continue pulm rehab - mucinex prn - rov 6   OSA on CPAP Continue CPAP

## 2014-01-01 NOTE — Assessment & Plan Note (Signed)
He continues to make mucous. Has not been on a scheduled BD, did not benefit from spiriva or symbicort. He uses SABA occasionally. Has not been using flutter frequently.  - continue albuterol prn - continue pulm rehab - mucinex prn - rov 6

## 2014-01-01 NOTE — Assessment & Plan Note (Signed)
Continue CPAP.  

## 2014-01-02 ENCOUNTER — Encounter (HOSPITAL_COMMUNITY): Payer: Self-pay

## 2014-01-04 ENCOUNTER — Encounter (HOSPITAL_COMMUNITY): Payer: Self-pay

## 2014-01-07 ENCOUNTER — Other Ambulatory Visit: Payer: Self-pay | Admitting: *Deleted

## 2014-01-07 ENCOUNTER — Encounter (HOSPITAL_COMMUNITY)
Admission: RE | Admit: 2014-01-07 | Discharge: 2014-01-07 | Disposition: A | Discharge status: Home / Self Care | Payer: Self-pay | Source: Ambulatory Visit | Attending: Emergency Medicine | Admitting: Emergency Medicine

## 2014-01-07 MED ORDER — INSULIN NPH (HUMAN) (ISOPHANE) 100 UNIT/ML ~~LOC~~ SUSP: SUBCUTANEOUS | Status: DC

## 2014-01-09 ENCOUNTER — Encounter (HOSPITAL_COMMUNITY)
Admission: RE | Admit: 2014-01-09 | Discharge: 2014-01-09 | Disposition: A | Discharge status: Home / Self Care | Payer: Self-pay | Source: Ambulatory Visit | Attending: Emergency Medicine | Admitting: Emergency Medicine

## 2014-01-11 ENCOUNTER — Encounter (HOSPITAL_COMMUNITY)
Admission: RE | Admit: 2014-01-11 | Discharge: 2014-01-11 | Disposition: A | Discharge status: Home / Self Care | Payer: Self-pay | Source: Ambulatory Visit | Attending: Emergency Medicine | Admitting: Emergency Medicine

## 2014-01-14 ENCOUNTER — Encounter (HOSPITAL_COMMUNITY)
Admission: RE | Admit: 2014-01-14 | Discharge: 2014-01-14 | Disposition: A | Discharge status: Home / Self Care | Payer: Self-pay | Source: Ambulatory Visit | Attending: Emergency Medicine | Admitting: Emergency Medicine

## 2014-01-16 ENCOUNTER — Encounter (HOSPITAL_COMMUNITY)
Admission: RE | Admit: 2014-01-16 | Discharge: 2014-01-16 | Disposition: A | Discharge status: Home / Self Care | Payer: Self-pay | Source: Ambulatory Visit | Attending: Emergency Medicine | Admitting: Emergency Medicine

## 2014-01-18 ENCOUNTER — Encounter (HOSPITAL_COMMUNITY): Payer: Self-pay

## 2014-01-21 ENCOUNTER — Encounter (HOSPITAL_COMMUNITY)
Admission: RE | Admit: 2014-01-21 | Discharge: 2014-01-21 | Disposition: A | Discharge status: Home / Self Care | Payer: Self-pay | Source: Ambulatory Visit | Attending: Emergency Medicine | Admitting: Emergency Medicine

## 2014-01-23 ENCOUNTER — Encounter (HOSPITAL_COMMUNITY): Payer: Self-pay

## 2014-01-25 ENCOUNTER — Encounter (HOSPITAL_COMMUNITY): Payer: Self-pay

## 2014-01-28 ENCOUNTER — Encounter (HOSPITAL_COMMUNITY): Payer: Self-pay

## 2014-01-30 ENCOUNTER — Encounter (HOSPITAL_COMMUNITY): Payer: Medicare Other

## 2014-01-30 DIAGNOSIS — R911 Solitary pulmonary nodule: Secondary | ICD-10-CM | POA: Insufficient documentation

## 2014-01-30 DIAGNOSIS — G4730 Sleep apnea, unspecified: Secondary | ICD-10-CM | POA: Insufficient documentation

## 2014-01-30 DIAGNOSIS — Z5189 Encounter for other specified aftercare: Secondary | ICD-10-CM | POA: Insufficient documentation

## 2014-01-30 DIAGNOSIS — J449 Chronic obstructive pulmonary disease, unspecified: Secondary | ICD-10-CM | POA: Insufficient documentation

## 2014-01-31 ENCOUNTER — Other Ambulatory Visit: Payer: Self-pay | Admitting: Emergency Medicine

## 2014-01-31 DIAGNOSIS — G4733 Obstructive sleep apnea (adult) (pediatric): Secondary | ICD-10-CM

## 2014-02-01 ENCOUNTER — Telehealth: Payer: Self-pay | Admitting: Internal Medicine

## 2014-02-01 ENCOUNTER — Encounter (HOSPITAL_COMMUNITY): Payer: Self-pay

## 2014-02-01 NOTE — Telephone Encounter (Signed)
Novalin will not be covered in 2016, pt wants to switch to Humulin as that will be covered in place, and Humulin quickpen will be covered.

## 2014-02-04 ENCOUNTER — Encounter (HOSPITAL_COMMUNITY)
Admission: RE | Admit: 2014-02-04 | Discharge: 2014-02-04 | Disposition: A | Discharge status: Home / Self Care | Payer: Self-pay | Source: Ambulatory Visit | Attending: Emergency Medicine | Admitting: Emergency Medicine

## 2014-02-05 NOTE — Telephone Encounter (Signed)
Please advise 

## 2014-02-06 ENCOUNTER — Encounter (HOSPITAL_COMMUNITY)
Admission: RE | Admit: 2014-02-06 | Discharge: 2014-02-06 | Disposition: A | Discharge status: Home / Self Care | Payer: Self-pay | Source: Ambulatory Visit | Attending: Emergency Medicine | Admitting: Emergency Medicine

## 2014-02-08 ENCOUNTER — Encounter (HOSPITAL_COMMUNITY)
Admission: RE | Admit: 2014-02-08 | Discharge: 2014-02-08 | Disposition: A | Discharge status: Home / Self Care | Payer: Self-pay | Source: Ambulatory Visit | Attending: Emergency Medicine | Admitting: Emergency Medicine

## 2014-02-09 NOTE — Telephone Encounter (Signed)
He needs to be seen

## 2014-02-11 ENCOUNTER — Encounter (HOSPITAL_COMMUNITY)
Admission: RE | Admit: 2014-02-11 | Discharge: 2014-02-11 | Disposition: A | Discharge status: Home / Self Care | Payer: Self-pay | Source: Ambulatory Visit | Attending: Emergency Medicine | Admitting: Emergency Medicine

## 2014-02-13 ENCOUNTER — Encounter (HOSPITAL_COMMUNITY): Payer: Self-pay

## 2014-02-15 ENCOUNTER — Encounter (HOSPITAL_COMMUNITY)
Admission: RE | Admit: 2014-02-15 | Discharge: 2014-02-15 | Disposition: A | Discharge status: Home / Self Care | Payer: Self-pay | Source: Ambulatory Visit | Attending: Emergency Medicine | Admitting: Emergency Medicine

## 2014-02-18 ENCOUNTER — Encounter (HOSPITAL_COMMUNITY): Payer: Self-pay

## 2014-02-20 ENCOUNTER — Encounter (HOSPITAL_COMMUNITY): Payer: Self-pay

## 2014-02-22 ENCOUNTER — Encounter (HOSPITAL_COMMUNITY): Payer: Self-pay

## 2014-02-25 ENCOUNTER — Encounter (HOSPITAL_COMMUNITY)
Admission: RE | Admit: 2014-02-25 | Discharge: 2014-02-25 | Disposition: A | Discharge status: Home / Self Care | Payer: Self-pay | Source: Ambulatory Visit | Attending: Emergency Medicine | Admitting: Emergency Medicine

## 2014-02-27 ENCOUNTER — Encounter (HOSPITAL_COMMUNITY)
Admission: RE | Admit: 2014-02-27 | Discharge: 2014-02-27 | Disposition: A | Discharge status: Home / Self Care | Payer: Self-pay | Source: Ambulatory Visit | Attending: Emergency Medicine | Admitting: Emergency Medicine

## 2014-03-01 ENCOUNTER — Encounter (HOSPITAL_COMMUNITY): Payer: Self-pay

## 2014-03-03 ENCOUNTER — Other Ambulatory Visit: Payer: Self-pay | Admitting: Emergency Medicine

## 2014-03-04 ENCOUNTER — Encounter (HOSPITAL_COMMUNITY)
Admission: RE | Admit: 2014-03-04 | Discharge: 2014-03-04 | Disposition: A | Discharge status: Home / Self Care | Payer: Self-pay | Source: Ambulatory Visit | Attending: Emergency Medicine | Admitting: Emergency Medicine

## 2014-03-04 ENCOUNTER — Other Ambulatory Visit: Payer: Self-pay | Admitting: Internal Medicine

## 2014-03-04 DIAGNOSIS — G4730 Sleep apnea, unspecified: Secondary | ICD-10-CM | POA: Insufficient documentation

## 2014-03-04 DIAGNOSIS — Z5189 Encounter for other specified aftercare: Secondary | ICD-10-CM | POA: Insufficient documentation

## 2014-03-04 DIAGNOSIS — R911 Solitary pulmonary nodule: Secondary | ICD-10-CM | POA: Insufficient documentation

## 2014-03-04 DIAGNOSIS — J449 Chronic obstructive pulmonary disease, unspecified: Secondary | ICD-10-CM | POA: Insufficient documentation

## 2014-03-06 ENCOUNTER — Encounter (HOSPITAL_COMMUNITY): Payer: Self-pay

## 2014-03-07 ENCOUNTER — Telehealth: Payer: Self-pay | Admitting: Emergency Medicine

## 2014-03-07 ENCOUNTER — Other Ambulatory Visit: Payer: Self-pay | Admitting: Emergency Medicine

## 2014-03-07 NOTE — Telephone Encounter (Signed)
Called and spoke to pt. Pt was requesting proair refill. Informed pt the rx was already sent in today (03/07/14) and to pt's preferred pharmacy. Pt verbalized understanding and denied any further questions or concerns at this time.

## 2014-03-08 ENCOUNTER — Encounter (HOSPITAL_COMMUNITY)
Admission: RE | Admit: 2014-03-08 | Discharge: 2014-03-08 | Disposition: A | Discharge status: Home / Self Care | Payer: Self-pay | Source: Ambulatory Visit | Attending: Emergency Medicine | Admitting: Emergency Medicine

## 2014-03-08 ENCOUNTER — Other Ambulatory Visit: Payer: Self-pay | Admitting: Internal Medicine

## 2014-03-08 ENCOUNTER — Other Ambulatory Visit: Payer: Self-pay | Admitting: *Deleted

## 2014-03-08 DIAGNOSIS — E785 Hyperlipidemia, unspecified: Secondary | ICD-10-CM

## 2014-03-08 MED ORDER — SIMVASTATIN 40 MG PO TABS: ORAL_TABLET | ORAL | Status: DC

## 2014-03-11 ENCOUNTER — Encounter (HOSPITAL_COMMUNITY)
Admission: RE | Admit: 2014-03-11 | Discharge: 2014-03-11 | Disposition: A | Discharge status: Home / Self Care | Payer: Self-pay | Source: Ambulatory Visit | Attending: Emergency Medicine | Admitting: Emergency Medicine

## 2014-03-11 ENCOUNTER — Encounter: Payer: Self-pay | Admitting: Internal Medicine

## 2014-03-11 ENCOUNTER — Ambulatory Visit (INDEPENDENT_AMBULATORY_CARE_PROVIDER_SITE_OTHER): Payer: Medicare Other | Admitting: Internal Medicine

## 2014-03-11 VITALS — BP 124/50 | HR 81 | Temp 97.6°F | Ht 74.0 in | Wt 284.2 lb

## 2014-03-11 DIAGNOSIS — I10 Essential (primary) hypertension: Secondary | ICD-10-CM

## 2014-03-11 DIAGNOSIS — E785 Hyperlipidemia, unspecified: Secondary | ICD-10-CM

## 2014-03-11 DIAGNOSIS — E1139 Type 2 diabetes mellitus with other diabetic ophthalmic complication: Secondary | ICD-10-CM

## 2014-03-11 MED ORDER — INSULIN ISOPHANE & REGULAR (HUMAN 70-30)100 UNIT/ML KWIKPEN: 30.0000 [IU] | PEN_INJECTOR | Freq: Two times a day (BID) | SUBCUTANEOUS | Status: DC

## 2014-03-11 NOTE — Assessment & Plan Note (Signed)
His BP is well controlled Will cont the current meds Will monitor his lytes and renal function today

## 2014-03-11 NOTE — Progress Notes (Signed)
Subjective:    Patient ID: Charles RudReuben [name], male    DOB: November 13, 1940, 74 y.o.   MRN: [id number]  Diabetes He presents for his follow-up diabetic visit. He has type 2 diabetes mellitus. His disease course has been stable. There are no hypoglycemic associated symptoms. Associated symptoms include visual change. Pertinent negatives for diabetes include no blurred vision, no chest pain, no fatigue, no foot paresthesias, no foot ulcerations, no polydipsia, no polyphagia, no polyuria, no weakness and no weight loss. There are no hypoglycemic complications. Symptoms are stable. Diabetic complications include heart disease and retinopathy. Current diabetic treatment includes insulin injections, intensive insulin program and oral agent (monotherapy). He is compliant with treatment most of the time. His weight is stable. He is following a generally unhealthy diet. When asked about meal planning, he reported none. He participates in exercise three times a week. There is no change in his home blood glucose trend. An ACE inhibitor/angiotensin II receptor blocker is being taken. He does not see a podiatrist.Eye exam is current.      Review of Systems  Constitutional: Negative.  Negative for fever, chills, weight loss, diaphoresis, appetite change and fatigue.  HENT: Negative.   Eyes: Negative.  Negative for blurred vision.  Respiratory: Negative.  Negative for cough, choking, chest tightness, shortness of breath and stridor.   Cardiovascular: Negative.  Negative for chest pain, palpitations and leg swelling.  Gastrointestinal: Negative.  Negative for nausea, vomiting, abdominal pain, diarrhea, constipation and blood in stool.  Endocrine: Negative.  Negative for polydipsia, polyphagia and polyuria.  Genitourinary: Negative.   Musculoskeletal: Negative.  Negative for myalgias, back pain, arthralgias and neck pain.  Skin: Negative.   Allergic/Immunologic: Negative.   Neurological: Negative.  Negative for  weakness.  Hematological: Negative.  Negative for adenopathy. Does not bruise/bleed easily.  Psychiatric/Behavioral: Negative.        Objective:   Physical Exam  Constitutional: He is oriented to person, place, and time. He appears well-developed and well-nourished. No distress.  HENT:  Head: Normocephalic and atraumatic.  Mouth/Throat: Oropharynx is clear and moist. No oropharyngeal exudate.  Eyes: Conjunctivae are normal. Right eye exhibits no discharge. Left eye exhibits no discharge. No scleral icterus.  Neck: Normal range of motion. Neck supple. No JVD present. No tracheal deviation present. No thyromegaly present.  Cardiovascular: Normal rate and intact distal pulses.  An irregularly irregular rhythm present. Exam reveals no gallop and no friction rub.   Murmur heard.  Systolic murmur is present with a grade of 1/6   No diastolic murmur is present  Pulmonary/Chest: Effort normal and breath sounds normal. No stridor. No respiratory distress. He has no wheezes. He has no rales. He exhibits no tenderness.  Abdominal: Soft. Bowel sounds are normal. He exhibits no distension and no mass. There is no tenderness. There is no rebound and no guarding.  Musculoskeletal: Normal range of motion. He exhibits edema (trace edema in BLE). He exhibits no tenderness.  Lymphadenopathy:    He has no cervical adenopathy.  Neurological: He is oriented to person, place, and time.  Skin: Skin is warm and dry. No rash noted. He is not diaphoretic. No erythema. No pallor.  Psychiatric: He has a normal mood and affect. His behavior is normal. Judgment and thought content normal.  Vitals reviewed.     Lab Results  Component Value Date   WBC 6.6 07/19/2013   HGB 13.6 07/19/2013   HCT 41.2 07/19/2013   PLT 278.0 07/19/2013   GLUCOSE 155* 07/19/2013  CHOL 144 07/19/2013   TRIG 64.0 07/19/2013   HDL 31.50* 07/19/2013   LDLCALC 100* 07/19/2013   ALT 13 07/19/2013   AST 25 07/19/2013   NA 139  07/19/2013   K 4.3 07/19/2013   CL 101 07/19/2013   CREATININE 1.1 07/19/2013   BUN 16 07/19/2013   CO2 31 07/19/2013   TSH 1.27 07/19/2013   PSA 1.28 07/19/2013   INR 1.5* 10/07/2011   HGBA1C 7.5* 07/19/2013      Assessment & Plan:

## 2014-03-11 NOTE — Progress Notes (Signed)
Pre visit review using our clinic review tool, if applicable. No additional management support is needed unless otherwise documented below in the visit note. 

## 2014-03-11 NOTE — Assessment & Plan Note (Signed)
His insurance does not cover novolog to changed to humulin Will check his A1C today and will monitor his renal function

## 2014-03-11 NOTE — Patient Instructions (Signed)

## 2014-03-11 NOTE — Assessment & Plan Note (Signed)
He is doing well on zocor Will recheck his FLP today 

## 2014-03-13 ENCOUNTER — Encounter (HOSPITAL_COMMUNITY): Payer: Self-pay

## 2014-03-15 ENCOUNTER — Encounter (HOSPITAL_COMMUNITY)
Admission: RE | Admit: 2014-03-15 | Discharge: 2014-03-15 | Disposition: A | Discharge status: Home / Self Care | Payer: Self-pay | Source: Ambulatory Visit | Attending: Emergency Medicine | Admitting: Emergency Medicine

## 2014-03-18 ENCOUNTER — Encounter (HOSPITAL_COMMUNITY): Payer: Self-pay

## 2014-03-20 ENCOUNTER — Encounter (HOSPITAL_COMMUNITY)
Admission: RE | Admit: 2014-03-20 | Discharge: 2014-03-20 | Disposition: A | Discharge status: Home / Self Care | Payer: Self-pay | Source: Ambulatory Visit | Attending: Emergency Medicine | Admitting: Emergency Medicine

## 2014-03-22 ENCOUNTER — Encounter (HOSPITAL_COMMUNITY): Payer: Self-pay

## 2014-03-25 ENCOUNTER — Encounter (HOSPITAL_COMMUNITY)
Admission: RE | Admit: 2014-03-25 | Discharge: 2014-03-25 | Disposition: A | Discharge status: Home / Self Care | Payer: Self-pay | Source: Ambulatory Visit | Attending: Emergency Medicine | Admitting: Emergency Medicine

## 2014-03-26 LAB — HM DIABETES EYE EXAM

## 2014-03-27 ENCOUNTER — Encounter (HOSPITAL_COMMUNITY): Payer: Self-pay

## 2014-03-29 ENCOUNTER — Encounter (HOSPITAL_COMMUNITY)
Admission: RE | Admit: 2014-03-29 | Discharge: 2014-03-29 | Disposition: A | Discharge status: Home / Self Care | Payer: Self-pay | Source: Ambulatory Visit | Attending: Emergency Medicine | Admitting: Emergency Medicine

## 2014-04-01 ENCOUNTER — Encounter (HOSPITAL_COMMUNITY)
Admission: RE | Admit: 2014-04-01 | Discharge: 2014-04-01 | Disposition: A | Discharge status: Home / Self Care | Payer: Self-pay | Source: Ambulatory Visit | Attending: Emergency Medicine | Admitting: Emergency Medicine

## 2014-04-01 DIAGNOSIS — J449 Chronic obstructive pulmonary disease, unspecified: Secondary | ICD-10-CM | POA: Insufficient documentation

## 2014-04-01 DIAGNOSIS — Z5189 Encounter for other specified aftercare: Secondary | ICD-10-CM | POA: Insufficient documentation

## 2014-04-01 DIAGNOSIS — G4730 Sleep apnea, unspecified: Secondary | ICD-10-CM | POA: Insufficient documentation

## 2014-04-01 DIAGNOSIS — R911 Solitary pulmonary nodule: Secondary | ICD-10-CM | POA: Insufficient documentation

## 2014-04-03 ENCOUNTER — Encounter (HOSPITAL_COMMUNITY)
Admission: RE | Admit: 2014-04-03 | Discharge: 2014-04-03 | Disposition: A | Discharge status: Home / Self Care | Payer: Self-pay | Source: Ambulatory Visit | Attending: Emergency Medicine | Admitting: Emergency Medicine

## 2014-04-05 ENCOUNTER — Encounter (HOSPITAL_COMMUNITY)
Admission: RE | Admit: 2014-04-05 | Discharge: 2014-04-05 | Disposition: A | Discharge status: Home / Self Care | Payer: Self-pay | Source: Ambulatory Visit | Attending: Emergency Medicine | Admitting: Emergency Medicine

## 2014-04-08 ENCOUNTER — Encounter (HOSPITAL_COMMUNITY): Payer: Self-pay

## 2014-04-10 ENCOUNTER — Encounter (HOSPITAL_COMMUNITY)
Admission: RE | Admit: 2014-04-10 | Discharge: 2014-04-10 | Disposition: A | Discharge status: Home / Self Care | Payer: Self-pay | Source: Ambulatory Visit | Attending: Emergency Medicine | Admitting: Emergency Medicine

## 2014-04-12 ENCOUNTER — Encounter (HOSPITAL_COMMUNITY)
Admission: RE | Admit: 2014-04-12 | Discharge: 2014-04-12 | Disposition: A | Discharge status: Home / Self Care | Payer: Self-pay | Source: Ambulatory Visit | Attending: Emergency Medicine | Admitting: Emergency Medicine

## 2014-04-15 ENCOUNTER — Encounter (HOSPITAL_COMMUNITY)
Admission: RE | Admit: 2014-04-15 | Discharge: 2014-04-15 | Disposition: A | Discharge status: Home / Self Care | Payer: Self-pay | Source: Ambulatory Visit | Attending: Emergency Medicine | Admitting: Emergency Medicine

## 2014-04-17 ENCOUNTER — Encounter (HOSPITAL_COMMUNITY)
Admission: RE | Admit: 2014-04-17 | Discharge: 2014-04-17 | Disposition: A | Discharge status: Home / Self Care | Payer: Self-pay | Source: Ambulatory Visit | Attending: Emergency Medicine | Admitting: Emergency Medicine

## 2014-04-19 ENCOUNTER — Encounter (HOSPITAL_COMMUNITY)
Admission: RE | Admit: 2014-04-19 | Discharge: 2014-04-19 | Disposition: A | Discharge status: Home / Self Care | Payer: Self-pay | Source: Ambulatory Visit | Attending: Emergency Medicine | Admitting: Emergency Medicine

## 2014-04-22 ENCOUNTER — Encounter (HOSPITAL_COMMUNITY): Payer: Self-pay

## 2014-04-23 ENCOUNTER — Ambulatory Visit (HOSPITAL_BASED_OUTPATIENT_CLINIC_OR_DEPARTMENT_OTHER): Payer: Medicare Other | Attending: Emergency Medicine | Admitting: Radiology

## 2014-04-23 VITALS — Ht 74.0 in | Wt 275.0 lb

## 2014-04-23 DIAGNOSIS — G4733 Obstructive sleep apnea (adult) (pediatric): Secondary | ICD-10-CM | POA: Diagnosis present

## 2014-04-24 ENCOUNTER — Encounter (HOSPITAL_COMMUNITY): Payer: Self-pay

## 2014-04-26 ENCOUNTER — Encounter (HOSPITAL_COMMUNITY)
Admission: RE | Admit: 2014-04-26 | Discharge: 2014-04-26 | Disposition: A | Discharge status: Home / Self Care | Payer: Self-pay | Source: Ambulatory Visit | Attending: Emergency Medicine | Admitting: Emergency Medicine

## 2014-04-29 ENCOUNTER — Encounter (HOSPITAL_COMMUNITY)
Admission: RE | Admit: 2014-04-29 | Discharge: 2014-04-29 | Disposition: A | Discharge status: Home / Self Care | Payer: Self-pay | Source: Ambulatory Visit | Attending: Emergency Medicine | Admitting: Emergency Medicine

## 2014-05-01 ENCOUNTER — Other Ambulatory Visit: Payer: Self-pay | Admitting: Internal Medicine

## 2014-05-01 ENCOUNTER — Encounter (HOSPITAL_COMMUNITY)
Admission: RE | Admit: 2014-05-01 | Discharge: 2014-05-01 | Disposition: A | Discharge status: Home / Self Care | Payer: Self-pay | Source: Ambulatory Visit | Attending: Emergency Medicine | Admitting: Emergency Medicine

## 2014-05-01 DIAGNOSIS — E113299 Type 2 diabetes mellitus with mild nonproliferative diabetic retinopathy without macular edema, unspecified eye: Secondary | ICD-10-CM

## 2014-05-01 DIAGNOSIS — G4730 Sleep apnea, unspecified: Secondary | ICD-10-CM | POA: Insufficient documentation

## 2014-05-01 DIAGNOSIS — J449 Chronic obstructive pulmonary disease, unspecified: Secondary | ICD-10-CM | POA: Insufficient documentation

## 2014-05-01 DIAGNOSIS — Z5189 Encounter for other specified aftercare: Secondary | ICD-10-CM | POA: Insufficient documentation

## 2014-05-01 DIAGNOSIS — R911 Solitary pulmonary nodule: Secondary | ICD-10-CM | POA: Insufficient documentation

## 2014-05-01 DIAGNOSIS — G4733 Obstructive sleep apnea (adult) (pediatric): Secondary | ICD-10-CM

## 2014-05-01 LAB — HM DIABETES EYE EXAM

## 2014-05-01 NOTE — Sleep Study (Signed)
   NAME: Charles RudReuben [name] DATE OF BIRTH:  23-May-1940 MEDICAL RECORD NUMBER [id number]  LOCATION: [facility]  PHYSICIAN: Barbaraann ShareCLANCE,Kinta Martis M  DATE OF STUDY: 04/23/2014  SLEEP STUDY TYPE: Nocturnal Polysomnogram               REFERRING PHYSICIAN: Leslye PeerByrum, Robert S, MD  INDICATION FOR STUDY: Hypersomnia with sleep apnea  EPWORTH SLEEPINESS SCORE:  2 HEIGHT: 6\' 2"  (188 cm)  WEIGHT: 275 lb (124.739 kg)    Body mass index is 35.29 kg/(m^2).  NECK SIZE: 19 in.  MEDICATIONS: Reviewed in the sleep record  SLEEP ARCHITECTURE: The patient had a total sleep time of only 67 minutes, with no slow-wave sleep and significantly decreased quantity of REM. Sleep onset latency was prolonged at 309 minutes, and REM onset was normal. Sleep efficiency was very poor at 18%.  RESPIRATORY DATA: The patient underwent a C Pap titration study with a large Fischer Paykel Simplus full face mask. His pressure was increased in order to treat both obstructive events and snoring, and he was titrated to a pressure of 9 cm of water by the end of the study. The study was very difficult in that the patient had a prolonged sleep onset, and only slept 67 minutes total.  OXYGEN DATA: There was transient oxygen desaturation as low as 80% with the patient's obstructive events prior to reaching optimal C Pap pressure.  CARDIAC DATA: The patient was noted to be in atrial fibrillation with a controlled ventricular response  MOVEMENT/PARASOMNIA: Moderate numbers of periodic limb movements were noted during his short total sleep time, with 7 per hour resulting in arousal or awakening. There were no abnormal behaviors noted.  IMPRESSION/ RECOMMENDATION:    1) successful C Pap titration study with a large Fischer Paykel Simplus full face mask and a treatment pressure of 9 cm of water. However, the patient did not initiate sleep until almost 4 AM, and only slept 67 minutes during the night. It is unclear if this truly  represents an optimal C Pap pressure. I would recommend a few weeks trial of an auto titrating device at home in order to better establish an optimal pressure. The patient should also be encouraged to work aggressively on weight loss.  2) atrial fibrillation with controlled ventricular response  3) moderate numbers of periodic limb movements with some sleep disruption. It is unclear if this is related to the patient's sleep disordered breathing, or whether he may have a concomitant primary movement disorder of sleep. Correlation is suggested.    Barbaraann ShareLANCE,[place]in[place] Boy M Diplomate, American Board of Sleep Medicine  ELECTRONICALLY SIGNED ON:  05/01/2014, 5:31 PM [facility] PH: [phone]   FX: [phone] ACCREDITED BY THE AMERICAN ACADEMY OF SLEEP MEDICINE

## 2014-05-03 ENCOUNTER — Encounter (HOSPITAL_COMMUNITY)
Admission: RE | Admit: 2014-05-03 | Discharge: 2014-05-03 | Disposition: A | Discharge status: Home / Self Care | Payer: Self-pay | Source: Ambulatory Visit | Attending: Emergency Medicine | Admitting: Emergency Medicine

## 2014-05-03 ENCOUNTER — Telehealth: Payer: Self-pay | Admitting: Emergency Medicine

## 2014-05-03 DIAGNOSIS — G4733 Obstructive sleep apnea (adult) (pediatric): Secondary | ICD-10-CM

## 2014-05-03 NOTE — Telephone Encounter (Signed)
Called made pt aware no results are in chart yet. MAde aware it can take up to 2 weeks for these results. RB have you seen any results? thanks

## 2014-05-06 ENCOUNTER — Encounter (HOSPITAL_COMMUNITY)
Admission: RE | Admit: 2014-05-06 | Discharge: 2014-05-06 | Disposition: A | Discharge status: Home / Self Care | Payer: Self-pay | Source: Ambulatory Visit | Attending: Emergency Medicine | Admitting: Emergency Medicine

## 2014-05-08 ENCOUNTER — Encounter (HOSPITAL_COMMUNITY)
Admission: RE | Admit: 2014-05-08 | Discharge: 2014-05-08 | Disposition: A | Discharge status: Home / Self Care | Payer: Self-pay | Source: Ambulatory Visit | Attending: Emergency Medicine | Admitting: Emergency Medicine

## 2014-05-08 NOTE — Telephone Encounter (Signed)
RB please advise when you have sleep study results. Thanks!

## 2014-05-10 ENCOUNTER — Encounter (HOSPITAL_COMMUNITY)
Admission: RE | Admit: 2014-05-10 | Discharge: 2014-05-10 | Disposition: A | Discharge status: Home / Self Care | Payer: Self-pay | Source: Ambulatory Visit | Attending: Emergency Medicine | Admitting: Emergency Medicine

## 2014-05-13 ENCOUNTER — Encounter (HOSPITAL_COMMUNITY)
Admission: RE | Admit: 2014-05-13 | Discharge: 2014-05-13 | Disposition: A | Discharge status: Home / Self Care | Payer: Self-pay | Source: Ambulatory Visit | Attending: Emergency Medicine | Admitting: Emergency Medicine

## 2014-05-15 ENCOUNTER — Encounter (HOSPITAL_COMMUNITY): Payer: Self-pay

## 2014-05-15 NOTE — Telephone Encounter (Signed)
RB please advise. Thanks.  

## 2014-05-16 ENCOUNTER — Other Ambulatory Visit: Payer: Self-pay | Admitting: Internal Medicine

## 2014-05-17 ENCOUNTER — Encounter (HOSPITAL_COMMUNITY)
Admission: RE | Admit: 2014-05-17 | Discharge: 2014-05-17 | Disposition: A | Discharge status: Home / Self Care | Payer: Self-pay | Source: Ambulatory Visit | Attending: Emergency Medicine | Admitting: Emergency Medicine

## 2014-05-17 NOTE — Telephone Encounter (Signed)
Needs large Fischer Paykel Simplus full face mask

## 2014-05-17 NOTE — Telephone Encounter (Signed)
Order entered for Auto-titration device with Large Fischer Paykel Simplus full face mask.  Patient notified. Nothing further needed.

## 2014-05-17 NOTE — Telephone Encounter (Signed)
CPAP titration showed that he used 9cm of H2O, but Dr Shelle Ironlance recommends that he should be placed on an auto-titration device. I would like to use this as his maintenance. Please let him know that we will order an auto-titration device for him.

## 2014-05-20 ENCOUNTER — Encounter (HOSPITAL_COMMUNITY): Payer: Self-pay

## 2014-05-22 ENCOUNTER — Encounter (HOSPITAL_COMMUNITY): Payer: Self-pay

## 2014-05-24 ENCOUNTER — Encounter (HOSPITAL_COMMUNITY): Payer: Self-pay

## 2014-05-27 ENCOUNTER — Encounter (HOSPITAL_COMMUNITY)
Admission: RE | Admit: 2014-05-27 | Discharge: 2014-05-27 | Disposition: A | Discharge status: Home / Self Care | Payer: Self-pay | Source: Ambulatory Visit | Attending: Emergency Medicine | Admitting: Emergency Medicine

## 2014-05-29 ENCOUNTER — Encounter (HOSPITAL_COMMUNITY): Payer: Self-pay

## 2014-05-31 ENCOUNTER — Encounter (HOSPITAL_COMMUNITY)
Admission: RE | Admit: 2014-05-31 | Discharge: 2014-05-31 | Disposition: A | Discharge status: Home / Self Care | Payer: Self-pay | Source: Ambulatory Visit | Attending: Emergency Medicine | Admitting: Emergency Medicine

## 2014-05-31 DIAGNOSIS — Z5189 Encounter for other specified aftercare: Secondary | ICD-10-CM | POA: Insufficient documentation

## 2014-05-31 DIAGNOSIS — R911 Solitary pulmonary nodule: Secondary | ICD-10-CM | POA: Insufficient documentation

## 2014-05-31 DIAGNOSIS — G4730 Sleep apnea, unspecified: Secondary | ICD-10-CM | POA: Insufficient documentation

## 2014-05-31 DIAGNOSIS — J449 Chronic obstructive pulmonary disease, unspecified: Secondary | ICD-10-CM | POA: Insufficient documentation

## 2014-06-03 ENCOUNTER — Encounter (HOSPITAL_COMMUNITY): Payer: Self-pay

## 2014-06-05 ENCOUNTER — Encounter: Payer: Self-pay | Admitting: Internal Medicine

## 2014-06-05 ENCOUNTER — Encounter (HOSPITAL_COMMUNITY): Payer: Self-pay

## 2014-06-05 ENCOUNTER — Ambulatory Visit (INDEPENDENT_AMBULATORY_CARE_PROVIDER_SITE_OTHER): Payer: Medicare Other | Admitting: Internal Medicine

## 2014-06-05 ENCOUNTER — Other Ambulatory Visit (INDEPENDENT_AMBULATORY_CARE_PROVIDER_SITE_OTHER): Payer: Medicare Other

## 2014-06-05 VITALS — BP 124/68 | HR 62 | Temp 97.8°F | Resp 16 | Ht 74.0 in | Wt 286.0 lb

## 2014-06-05 DIAGNOSIS — I2510 Atherosclerotic heart disease of native coronary artery without angina pectoris: Secondary | ICD-10-CM | POA: Diagnosis not present

## 2014-06-05 DIAGNOSIS — I4819 Other persistent atrial fibrillation: Secondary | ICD-10-CM

## 2014-06-05 DIAGNOSIS — R319 Hematuria, unspecified: Secondary | ICD-10-CM

## 2014-06-05 DIAGNOSIS — E11339 Type 2 diabetes mellitus with moderate nonproliferative diabetic retinopathy without macular edema: Secondary | ICD-10-CM

## 2014-06-05 DIAGNOSIS — I10 Essential (primary) hypertension: Secondary | ICD-10-CM

## 2014-06-05 DIAGNOSIS — E785 Hyperlipidemia, unspecified: Secondary | ICD-10-CM | POA: Diagnosis not present

## 2014-06-05 DIAGNOSIS — R072 Precordial pain: Secondary | ICD-10-CM

## 2014-06-05 DIAGNOSIS — E113399 Type 2 diabetes mellitus with moderate nonproliferative diabetic retinopathy without macular edema, unspecified eye: Secondary | ICD-10-CM

## 2014-06-05 DIAGNOSIS — I481 Persistent atrial fibrillation: Secondary | ICD-10-CM

## 2014-06-05 LAB — COMPREHENSIVE METABOLIC PANEL
ALBUMIN: 3.7 g/dL (ref 3.5–5.2)
ALT: 14 U/L (ref 0–53)
AST: 32 U/L (ref 0–37)
Alkaline Phosphatase: 75 U/L (ref 39–117)
BUN: 21 mg/dL (ref 6–23)
CALCIUM: 9.1 mg/dL (ref 8.4–10.5)
CHLORIDE: 100 meq/L (ref 96–112)
CO2: 29 meq/L (ref 19–32)
Creatinine, Ser: 1.35 mg/dL (ref 0.40–1.50)
GFR: 54.7 mL/min — AB (ref >60.00)
GLUCOSE: 236 mg/dL — AB (ref 70–99)
Potassium: 4.4 mEq/L (ref 3.5–5.1)
SODIUM: 136 meq/L (ref 135–145)
TOTAL PROTEIN: 7.2 g/dL (ref 6.0–8.3)
Total Bilirubin: 1 mg/dL (ref 0.2–1.2)

## 2014-06-05 LAB — CBC WITH DIFFERENTIAL/PLATELET
BASOS ABS: 0 10*3/uL (ref 0.0–0.1)
Basophils Relative: 0.6 % (ref 0.0–3.0)
EOS PCT: 3.9 % (ref 0.0–5.0)
Eosinophils Absolute: 0.3 10*3/uL (ref 0.0–0.7)
HEMATOCRIT: 41.9 % (ref 39.0–52.0)
HEMOGLOBIN: 14 g/dL (ref 13.0–17.0)
LYMPHS ABS: 1 10*3/uL (ref 0.7–4.0)
Lymphocytes Relative: 14.7 % (ref 12.0–46.0)
MCHC: 33.3 g/dL (ref 30.0–36.0)
MCV: 89.2 fl (ref 78.0–100.0)
Monocytes Absolute: 0.6 10*3/uL (ref 0.1–1.0)
Monocytes Relative: 9.2 % (ref 3.0–12.0)
Neutro Abs: 5 10*3/uL (ref 1.4–7.7)
Neutrophils Relative %: 71.6 % (ref 43.0–77.0)
Platelets: 230 10*3/uL (ref 150.0–400.0)
RBC: 4.7 Mil/uL (ref 4.22–5.81)
RDW: 15.3 % (ref 11.5–15.5)
WBC: 7 10*3/uL (ref 4.0–10.5)

## 2014-06-05 LAB — URINALYSIS, ROUTINE W REFLEX MICROSCOPIC
Bilirubin Urine: NEGATIVE
Ketones, ur: NEGATIVE
Leukocytes, UA: NEGATIVE
NITRITE: NEGATIVE
PH: 5.5 (ref 5.0–8.0)
Specific Gravity, Urine: >=1.03 — AB (ref 1.000–1.030)
Urine Glucose: 100 — AB
Urobilinogen, UA: 1 (ref 0.0–1.0)

## 2014-06-05 LAB — LIPID PANEL
Cholesterol: 136 mg/dL (ref 0–200)
HDL: 24.2 mg/dL — ABNORMAL LOW (ref >39.00)
LDL CALC: 96 mg/dL (ref 0–99)
NONHDL: 111.8
Total CHOL/HDL Ratio: 6
Triglycerides: 80 mg/dL (ref 0.0–149.0)
VLDL: 16 mg/dL (ref 0.0–40.0)

## 2014-06-05 LAB — MICROALBUMIN / CREATININE URINE RATIO
Creatinine,U: 162.4 mg/dL
MICROALB UR: 11.6 mg/dL — AB (ref 0.0–1.9)
Microalb Creat Ratio: 7.1 mg/g (ref 0.0–30.0)

## 2014-06-05 LAB — HEMOGLOBIN A1C: Hgb A1c MFr Bld: 8.8 % — ABNORMAL HIGH (ref 4.6–6.5)

## 2014-06-05 LAB — CARDIAC PANEL
CK MB: 1.8 ng/mL (ref 0.3–4.0)
Relative Index: 3.8 calc — ABNORMAL HIGH (ref 0.0–2.5)
Total CK: 48 U/L (ref 7–232)

## 2014-06-05 LAB — TROPONIN I: TNIDX: 0.02 ug/L (ref 0.00–0.06)

## 2014-06-05 LAB — TSH: TSH: 1.93 u[IU]/mL (ref 0.35–4.50)

## 2014-06-05 NOTE — Progress Notes (Signed)
Pre visit review using our clinic review tool, if applicable. No additional management support is needed unless otherwise documented below in the visit note. 

## 2014-06-05 NOTE — Progress Notes (Signed)
Subjective:    Patient ID: Charles [name], male    DOB: Mar 14, 1940, 74 y.o.   MRN: [id number]  HPI Comments: He complains of an episode of CP he had about 5 days ago, he was getting out of the bed and felt a dull ache over his left anterior chest wall, he took a few aspirin and a few minutes later the pain had resolved and has not returned. He did not experience any DOE, SOB, cough, diaphoresis, dizziness or syncope.  Diabetes Pertinent negatives for hypoglycemia include no dizziness, headaches or speech difficulty. Associated symptoms include chest pain and fatigue. Pertinent negatives for diabetes include no weakness.      Review of Systems  Constitutional: Positive for fatigue. Negative for fever, chills, diaphoresis and appetite change.  HENT: Negative.   Eyes: Negative.   Respiratory: Negative.  Negative for cough, choking, chest tightness, shortness of breath and stridor.   Cardiovascular: Positive for chest pain. Negative for palpitations and leg swelling.  Gastrointestinal: Negative.  Negative for abdominal pain.  Endocrine: Negative.   Genitourinary: Negative.  Negative for urgency, hematuria, flank pain, enuresis and difficulty urinating.  Musculoskeletal: Negative.   Skin: Negative.   Allergic/Immunologic: Negative.   Neurological: Negative.  Negative for dizziness, syncope, speech difficulty, weakness, light-headedness and headaches.  Hematological: Negative.  Negative for adenopathy. Does not bruise/bleed easily.  Psychiatric/Behavioral: Negative.        Objective:   Physical Exam  Constitutional: He is oriented to person, place, and time. He appears well-developed and well-nourished.  Non-toxic appearance. He does not have a sickly appearance. He does not appear ill. No distress.  HENT:  Head: Normocephalic and atraumatic.  Mouth/Throat: Oropharynx is clear and moist. No oropharyngeal exudate.  Eyes: Conjunctivae are normal. Right eye exhibits no discharge.  Left eye exhibits no discharge. No scleral icterus.  Neck: Normal range of motion. Neck supple. No JVD present. No tracheal deviation present. No thyromegaly present.  Cardiovascular: Intact distal pulses.  An irregularly irregular rhythm present. Bradycardia present.  Exam reveals no gallop and no friction rub.   Murmur heard.  Decrescendo systolic murmur is present with a grade of 1/6   No diastolic murmur is present  Pulmonary/Chest: Effort normal and breath sounds normal. No stridor. No respiratory distress. He has no wheezes. He has no rales. Chest wall is not dull to percussion. He exhibits no mass, no tenderness, no bony tenderness, no laceration, no crepitus, no edema, no deformity, no swelling and no retraction.  Abdominal: Soft. Bowel sounds are normal. He exhibits no distension and no mass. There is no tenderness. There is no rebound and no guarding.  Musculoskeletal: Normal range of motion. He exhibits no edema or tenderness.  Lymphadenopathy:    He has no cervical adenopathy.  Neurological: He is oriented to person, place, and time.  Skin: Skin is warm and dry. No rash noted. He is not diaphoretic. No erythema. No pallor.  Vitals reviewed.    Lab Results  Component Value Date   WBC 6.6 07/19/2013   HGB 13.6 07/19/2013   HCT 41.2 07/19/2013   PLT 278.0 07/19/2013   GLUCOSE 155* 07/19/2013   CHOL 144 07/19/2013   TRIG 64.0 07/19/2013   HDL 31.50* 07/19/2013   LDLCALC 100* 07/19/2013   ALT 13 07/19/2013   AST 25 07/19/2013   NA 139 07/19/2013   K 4.3 07/19/2013   CL 101 07/19/2013   CREATININE 1.1 07/19/2013   BUN 16 07/19/2013   CO2 31 07/19/2013  TSH 1.27 07/19/2013   PSA 1.28 07/19/2013   INR 1.5* 10/07/2011   HGBA1C 7.5* 07/19/2013       Assessment & Plan:

## 2014-06-05 NOTE — Patient Instructions (Signed)

## 2014-06-06 NOTE — Assessment & Plan Note (Signed)
He has good rate control He refuses to be anticoagulated

## 2014-06-06 NOTE — Assessment & Plan Note (Signed)
He is due for an A1C Will address if it is abnormal

## 2014-06-06 NOTE — Assessment & Plan Note (Signed)
The recent CP was atypical Will get a lexiscan to see if there is ischemia He is due for a cardiology f/up

## 2014-06-06 NOTE — Assessment & Plan Note (Signed)
His CP was atypical and not concerning for ischemia though he has a hx of CAD and by his report has not had any cardiology f/up for several years EKG shows A fib with low heart rate, there is diffuse T wave flattening and poor RWP but this is consistent with his old EKG Will check cardiac enzymes - if + will admit, if - will get a lexiscan done

## 2014-06-07 ENCOUNTER — Encounter (HOSPITAL_COMMUNITY): Payer: Self-pay

## 2014-06-07 ENCOUNTER — Encounter: Payer: Self-pay | Admitting: Internal Medicine

## 2014-06-07 ENCOUNTER — Telehealth: Payer: Self-pay | Admitting: Internal Medicine

## 2014-06-07 ENCOUNTER — Ambulatory Visit (INDEPENDENT_AMBULATORY_CARE_PROVIDER_SITE_OTHER): Payer: Medicare Other | Admitting: Internal Medicine

## 2014-06-07 VITALS — BP 122/60 | HR 57 | Temp 98.2°F | Resp 18 | Ht 74.0 in | Wt 282.2 lb

## 2014-06-07 DIAGNOSIS — E11339 Type 2 diabetes mellitus with moderate nonproliferative diabetic retinopathy without macular edema: Secondary | ICD-10-CM | POA: Diagnosis not present

## 2014-06-07 DIAGNOSIS — E113399 Type 2 diabetes mellitus with moderate nonproliferative diabetic retinopathy without macular edema, unspecified eye: Secondary | ICD-10-CM

## 2014-06-07 DIAGNOSIS — R197 Diarrhea, unspecified: Secondary | ICD-10-CM | POA: Diagnosis not present

## 2014-06-07 DIAGNOSIS — I10 Essential (primary) hypertension: Secondary | ICD-10-CM | POA: Diagnosis not present

## 2014-06-07 DIAGNOSIS — I2510 Atherosclerotic heart disease of native coronary artery without angina pectoris: Secondary | ICD-10-CM

## 2014-06-07 MED ORDER — DIPHENOXYLATE-ATROPINE 2.5-0.025 MG PO TABS: 1.0000 | ORAL_TABLET | Freq: Four times a day (QID) | ORAL | Status: DC | PRN

## 2014-06-07 NOTE — Progress Notes (Signed)
Pre visit review using our clinic review tool, if applicable. No additional management support is needed unless otherwise documented below in the visit note. 

## 2014-06-07 NOTE — Telephone Encounter (Signed)
[facility] - Client TELEPHONE ADVICE RECORD [facility] Patient Name: Charles [name] E DOB: Jul 17, 1940 Initial Comment Caller states he has had diarrhea since yesterday and now it is very runny. Nurse Assessment Nurse: Ladona RidgelGaddy, RN, Felicia Date/Time (Eastern Time): 06/07/2014 3:13:57 PM Confirm and document reason for call. If symptomatic, describe symptoms. ---Pt has had soft stools for a week (not diarrhea). Water diarrhea for 2 d. No fever. Has the patient traveled out of the country within the last 30 days? ---No Does the patient require triage? ---Yes Related visit to physician within the last 2 weeks? ---No Does the PT have any chronic conditions? (i.e. diabetes, asthma, etc.) ---YesList chronic conditions. ---DM Guidelines Guideline Title Affirmed Question Affirmed Notes Diarrhea [1] Age > 60 years AND [2] > 6 diarrhea stools in past 24 hours Final Disposition User See Physician within 4 Hours (or PCP triage) Ladona RidgelGaddy, RN, Felicia Call Id: [phone]

## 2014-06-07 NOTE — Progress Notes (Signed)
Subjective:    Patient ID: Charles [name], male    DOB: [birth year], 74 y.o.   MRN: [id number]  HPI  Here for acute visit with c/o 2 days simply watery diarrhea mult episodes, but without HA, feeling poorly, cough, abd pain, n/v, rash, joint painm, f/c or other acute symptoms.  No prior hx. Has had somewhat loose stools for > 1 wk without diet change, wt loss and Denies worsening reflux, dysphagia, n/v, or blood. Usually has recurring constipation for several yrs. Pt denies chest pain, increased sob or doe, wheezing, orthopnea, PND, increased LE swelling, palpitations, dizziness or syncope.  Pt denies new neurological symptoms such as new headache, or facial or extremity weakness or numbness.  No recent med  Pt denies polydipsia, polyuria,.      Past Medical History  Diagnosis Date  . Morbid obesity   . Diverticulosis of colon   . Acute pancreatitis     Gallstone pancreatitis  . COPD (chronic obstructive pulmonary disease)   . OSA (obstructive sleep apnea)     Dr. Lamonte Sakai  . CAD (coronary artery disease)   . Cataract   . Nonexudative senile macular degeneration of retina   . Solitary pulmonary nodule     Dr. Lamonte Sakai  Followed by serial CTs  . Hyperlipidemia   . Osteoarthritis   . Anemia   . Diabetes mellitus, type 2   . Asthma   . Hypertension   . Stroke   1996     Remote past  . Ejection fraction     EF 55-60%, echo, March, 2013, Good RV function  . Aortic stenosis     Mild, echo, March, 2013,   . Atrial fibrillation     New diagnosis August 11, 2011  . DOE (dyspnea on exertion)     Dr. Lamonte Sakai,  obesity and deconditioning  playing a major role. No definite pulmonary hypertension on echo.  . Edema     Probably from venous insufficiency   Past Surgical History  Procedure Laterality Date  . Cholecystectomy    . Tonsillectomy      reports that he quit smoking about 24 years ago. His smoking use included Cigarettes. He has a 105 pack-year smoking history. He has never used  smokeless tobacco. He reports that he does not drink alcohol or use illicit drugs. family history includes Cancer in his father; Diabetes in his mother; Heart attack in his mother; Heart disease in his mother; Heart failure in his father; Hypertension in his mother. No Known Allergies Current Outpatient Prescriptions on File Prior to Visit  Medication Sig Dispense Refill  . Aflibercept (EYLEA) 2 MG/0.05ML SOLN Injection every 12 weeks    . atenolol (TENORMIN) 50 MG tablet TAKE 1 TABLET EACH DAY. 90 tablet 3  . B-D INS SYRINGE 0.5CC/30GX1/2" 30G X 1/2" 0.5 ML MISC USE AS DIRECTED. 200 each PRN  . Blood Glucose Monitoring Suppl (TRUETRACK BLOOD GLUCOSE) W/DEVICE KIT Test up to TID Dx: E11.39 1 each 2  . glucose blood (TRUETEST TEST) test strip Use as directed three times daily to check blood sugar.  Diagnosis code E11.39. 300 each 1  . glucose blood (TRUETRACK TEST) test strip Test up to TID Dx:E11.39 100 each 12  . guaiFENesin (MUCINEX) 600 MG 12 hr tablet Take 600 mg by mouth daily.    . hydrochlorothiazide (HYDRODIURIL) 25 MG tablet TAKE 1 TABLET ONCE DAILY. 90 tablet 1  . Insulin Isophane & Regular Human (HUMULIN 70/30 KWIKPEN) (70-30) 100 UNIT/ML PEN Inject  30 Units into the skin 2 (two) times daily with a meal. 15 mL 11  . lisinopril (PRINIVIL,ZESTRIL) 20 MG tablet TAKE 1 TABLET ONCE DAILY. 90 tablet 3  . metFORMIN (GLUCOPHAGE) 1000 MG tablet TAKE 1 TABLET TWICE DAILY. 180 tablet 1  . minocycline (MINOCIN,DYNACIN) 100 MG capsule Take 100 mg by mouth. 3 times a week    . Omega-3 Fatty Acids (FISH OIL) 1000 MG CAPS Take 1 capsule by mouth daily.      Marland Kitchen PROAIR HFA 108 (90 BASE) MCG/ACT inhaler USE 2 PUFFS EVERY 6 HOURS AS NEEDED. 8.5 g 1  . Respiratory Therapy Supplies (FLUTTER) DEVI Use as directed 1 each 0  . simvastatin (ZOCOR) 40 MG tablet TAKE 1 TABLET IN THE P.M. 90 tablet 3   No current facility-administered medications on file prior to visit.   Review of Systems  Constitutional:  Negative for unusual diaphoresis or night sweats HENT: Negative for ringing in ear or discharge Eyes: Negative for double vision or worsening visual disturbance.  Respiratory: Negative for choking and stridor.   Gastrointestinal: Negative for vomiting or other signifcant bowel change Genitourinary: Negative for hematuria or change in urine volume.  Musculoskeletal: Negative for other MSK pain or swelling Skin: Negative for color change and worsening wound.  Neurological: Negative for tremors and numbness other than noted  Psychiatric/Behavioral: Negative for decreased concentration or agitation other than above       Objective:   Physical Exam BP 122/60 mmHg  Pulse 57  Temp(Src) 98.2 F (36.8 C) (Oral)  Resp 18  Ht 6' 2"  (1.88 m)  Wt 282 lb 2.6 oz (127.987 kg)  BMI 36.21 kg/m2  SpO2 94% VS noted,  Constitutional: Pt appears in no significant distress HENT: Head: NCAT.  Right Ear: External ear normal.  Left Ear: External ear normal.  Eyes: . Pupils are equal, round, and reactive to light. Conjunctivae and EOM are normal Neck: Normal range of motion. Neck supple.  Cardiovascular: Normal rate and regular rhythm.   Pulmonary/Chest: Effort normal and breath sounds without rales or wheezing.  Abd:  Soft, NT, ND, + BS Neurological: Pt is alert. Not confused , motor grossly intact Skin: Skin is warm. No rash, no LE edema Psychiatric: Pt behavior is normal. No agitation.     Assessment & Plan:

## 2014-06-07 NOTE — Patient Instructions (Signed)
Please take all new medication as prescribed - the lomotil  Please hold on taking the fluid pill (HCT) while you are having the diarrhea  Please continue all other medications as before, and refills have been done if requested.  Please have the pharmacy call with any other refills you may need.  Please keep your appointments with your specialists as you may have planned

## 2014-06-07 NOTE — Assessment & Plan Note (Signed)
Etiology unclear, afeb and exam benign, for immodium or lomotil prn, follow for worsening s/s, hold on stool studies/labs for now, pt agrees

## 2014-06-07 NOTE — Assessment & Plan Note (Signed)
Poor control recently, o/w stable overall by history and exam, recent data reviewed with pt, and pt to continue medical treatment as before,  to f/u with PCP, cont diet and wt loss efforts Lab Results  Component Value Date   HGBA1C 8.8* 06/05/2014

## 2014-06-07 NOTE — Assessment & Plan Note (Addendum)
stable overall by history and exam, recent data reviewed with pt, and pt to continue medical treatment as before,  to f/u any worsening symptoms or concerns BP Readings from Last 3 Encounters:  06/07/14 122/60  06/05/14 124/68  03/11/14 124/50   To hold hct during diarrheal illness

## 2014-06-07 NOTE — Telephone Encounter (Signed)
Agree with patient being seen today for the diarrhea. Either here or urgent care.

## 2014-06-07 NOTE — Telephone Encounter (Signed)
MD is out of office. Pls advise on call-a-nurse msg...Raechel Chute/lmb

## 2014-06-07 NOTE — Telephone Encounter (Signed)
Called pt he  is here seeing Dr. Jonny RuizJohn for sxs....Raechel Chute/lmb

## 2014-06-10 ENCOUNTER — Encounter (HOSPITAL_COMMUNITY): Payer: Self-pay

## 2014-06-12 ENCOUNTER — Encounter (HOSPITAL_COMMUNITY): Payer: Self-pay

## 2014-06-14 ENCOUNTER — Telehealth: Payer: Self-pay | Admitting: *Deleted

## 2014-06-14 ENCOUNTER — Encounter (HOSPITAL_COMMUNITY): Payer: Self-pay

## 2014-06-14 NOTE — Telephone Encounter (Signed)
[facility] - Client TELEPHONE ADVICE RECORD [facility] Patient Name: Charles [name] Gender: Male DOB: 1941/01/19 Age: [age] Y 5 M 9 D Return Phone Number: [phone] (Primary) Address: City/State/Zip:  Client [facility] - Client Client Site [facility] Day Physician Sanda LingerJones, Thomas Contact Type Call Call Type Triage / Clinical Relationship To Patient Self Appointment Disposition EMR Appointment Scheduled Info pasted into Epic Yes Return Phone Number [phone] (Primary) Chief Complaint Diarrhea Initial Comment Caller states he has had diarrhea since yesterday and now it is very runny. PreDisposition Home Care Nurse Assessment Nurse: Ladona RidgelGaddy, RN, Sunny SchleinFelicia Date/Time [name]: 06/07/2014 3:13:57 PM Confirm and document reason for call. If symptomatic, describe symptoms. ---Pt has had soft stools for a week (not diarrhea). Water diarrhea for 2 d. No fever. Has the patient traveled out of the country within the last 30 days? ---No Does the patient require triage? ---Yes Related visit to physician within the last 2 weeks? ---No Does the PT have any chronic conditions? (i.[name]. diabetes, asthma, etc.) ---Yes List chronic conditions. ---DM Guidelines Guideline Title Affirmed Question Affirmed Notes Nurse Date/Time (Eastern Time) Diarrhea [1] Age > 60 years AND [2] > 6 diarrhea stools in past 24 hours Ladona RidgelGaddy, RN, [name] 06/07/2014 3:15:45 PM Disp. Time [name] Disposition Final User 06/07/2014 3:18:48 PM See Physician within 4 Hours (or PCP triage) Yes Gaddy, RN, Lavena StanfordFelicia Caller Understands: Yes Disagree/Comply: Comply PLEASE NOTE: All timestamps contained within this report are represented as [place] Standard Time. CONFIDENTIALTY NOTICE: This fax transmission is intended only for the addressee. It contains information that is legally privileged, confidential or otherwise protected from use or  disclosure. If you are not the intended recipient, you are strictly prohibited from reviewing, disclosing, copying using or disseminating any of this information or taking any action in reliance on or regarding this information. If you have received this fax in error, please notify us immediately by telephone so that we can arrange for its return to us. Phone: [phone], Toll-Free: [phone], Fax: [phone] Page: 2 of 2 Call Id: [id number] Care Advice Given Per Guideline SEE PHYSICIAN WITHIN 4 HOURS (or PCP triage): * Other options: oral rehydration solution (Pedialyte or Rehydralyte) . * Sip water or a sports - rehydration drink (Gatorade or Powerade) * Drink more fluids. * You become worse. After Care Instructions Given Call Event Type User Date / Time Description Referrals REFERRED TO PCP OFFICE REFERRED TO PCP OFFICE

## 2014-06-17 ENCOUNTER — Encounter (HOSPITAL_COMMUNITY)
Admission: RE | Admit: 2014-06-17 | Discharge: 2014-06-17 | Disposition: A | Discharge status: Home / Self Care | Payer: Self-pay | Source: Ambulatory Visit | Attending: Emergency Medicine | Admitting: Emergency Medicine

## 2014-06-17 NOTE — Progress Notes (Signed)
Pt in today for pulmonary rehab maintenance program.  Pt absent for several sessions due to diarrhea stools.  Pt seen in office by Dr. Oliver BarreJames John and was given medications to help with the frequency of the stools.  Pt had loose stool right before coming to class today.  Pt not allowed to exercise per rehab policy - 48 hours diarrhea free before returning to exercise. Pt admits his stools are less but not completely gone.  Pt describes the stools as watery in nature denies any cramping.  Pt no longer soils himself he has better control than he did before. ?c difficile. Pt stated stool sample was not obtained. Called and scheduled appt for pt to be seen as follow up from the 4/8 with the continued issue of diarhea on this Wednesday at 3:15 by his primary MD Dr. Sanda Lingerhomas Jones.  Pt is in agreement of this and will be able to schedule [facility] services to provide transportation.  Pt also remarked that he will be having a nuclear stress test on next week. Pt sees dr. Myrtis SerKatz but is in the process of switching to another physician for his cardiology care. Pt further advised he should not exercise in rehab until the nuclear stress test is completed and he is diarrhea free for 48 hours. Alanson Alyarlette Georgena Weisheit RN, BSN

## 2014-06-19 ENCOUNTER — Ambulatory Visit (INDEPENDENT_AMBULATORY_CARE_PROVIDER_SITE_OTHER): Payer: Medicare Other | Admitting: Internal Medicine

## 2014-06-19 ENCOUNTER — Encounter (HOSPITAL_COMMUNITY): Payer: Self-pay

## 2014-06-19 VITALS — BP 118/62 | HR 51 | Temp 97.5°F | Resp 16 | Ht 74.0 in | Wt 278.0 lb

## 2014-06-19 DIAGNOSIS — R197 Diarrhea, unspecified: Secondary | ICD-10-CM

## 2014-06-19 DIAGNOSIS — I2510 Atherosclerotic heart disease of native coronary artery without angina pectoris: Secondary | ICD-10-CM | POA: Diagnosis not present

## 2014-06-19 DIAGNOSIS — E11339 Type 2 diabetes mellitus with moderate nonproliferative diabetic retinopathy without macular edema: Secondary | ICD-10-CM | POA: Diagnosis not present

## 2014-06-19 DIAGNOSIS — E113399 Type 2 diabetes mellitus with moderate nonproliferative diabetic retinopathy without macular edema, unspecified eye: Secondary | ICD-10-CM

## 2014-06-19 NOTE — Patient Instructions (Signed)

## 2014-06-19 NOTE — Progress Notes (Signed)
   Subjective:    Patient ID: Charles RudReuben [name], male    DOB: May 22, 1940, 74 y.o.   MRN: [id number]  HPI Comments: He returns for f/up on diarrhea - it has resolved, he has had 1 BM in the last 2 days.     Review of Systems  Constitutional: Negative.  Negative for fever, chills, diaphoresis, appetite change and fatigue.  HENT: Negative.   Eyes: Negative.   Respiratory: Negative.  Negative for cough, choking, chest tightness, shortness of breath and stridor.   Cardiovascular: Negative.  Negative for chest pain, palpitations and leg swelling.  Gastrointestinal: Negative.  Negative for nausea, abdominal pain, diarrhea, constipation and blood in stool.  Endocrine: Negative.   Genitourinary: Negative.   Musculoskeletal: Negative.   Skin: Negative.   Allergic/Immunologic: Negative.   Neurological: Negative.   Hematological: Negative.  Negative for adenopathy. Does not bruise/bleed easily.  Psychiatric/Behavioral: Negative.        Objective:   Physical Exam  Constitutional: He is oriented to person, place, and time. He appears well-developed and well-nourished. No distress.  HENT:  Head: Normocephalic and atraumatic.  Mouth/Throat: Oropharynx is clear and moist. No oropharyngeal exudate.  Eyes: Conjunctivae are normal. Right eye exhibits no discharge. Left eye exhibits no discharge. No scleral icterus.  Neck: Normal range of motion. Neck supple. No JVD present. No tracheal deviation present. No thyromegaly present.  Cardiovascular: Normal rate, regular rhythm, normal heart sounds and intact distal pulses.  Exam reveals no gallop and no friction rub.   No murmur heard. Pulmonary/Chest: Effort normal and breath sounds normal. No stridor. No respiratory distress. He has no wheezes. He has no rales. He exhibits no tenderness.  Abdominal: Soft. Bowel sounds are normal. He exhibits no distension and no mass. There is no tenderness. There is no rebound and no guarding.  Musculoskeletal:  Normal range of motion. He exhibits no edema or tenderness.  Lymphadenopathy:    He has no cervical adenopathy.  Neurological: He is oriented to person, place, and time.  Skin: Skin is warm and dry. No rash noted. He is not diaphoretic. No erythema. No pallor.  Vitals reviewed.    Lab Results  Component Value Date   WBC 7.0 06/05/2014   HGB 14.0 06/05/2014   HCT 41.9 06/05/2014   PLT 230.0 06/05/2014   GLUCOSE 236* 06/05/2014   CHOL 136 06/05/2014   TRIG 80.0 06/05/2014   HDL 24.20* 06/05/2014   LDLCALC 96 06/05/2014   ALT 14 06/05/2014   AST 32 06/05/2014   NA 136 06/05/2014   K 4.4 06/05/2014   CL 100 06/05/2014   CREATININE 1.35 06/05/2014   BUN 21 06/05/2014   CO2 29 06/05/2014   TSH 1.93 06/05/2014   PSA 1.28 07/19/2013   INR 1.5* 10/07/2011   HGBA1C 8.8* 06/05/2014   MICROALBUR 11.6* 06/05/2014       Assessment & Plan:

## 2014-06-21 ENCOUNTER — Encounter (HOSPITAL_COMMUNITY): Admission: RE | Admit: 2014-06-21 | Payer: Self-pay | Source: Ambulatory Visit

## 2014-06-23 ENCOUNTER — Encounter: Payer: Self-pay | Admitting: Internal Medicine

## 2014-06-23 NOTE — Assessment & Plan Note (Signed)
His blood sugars are not well controlled HE is not willing to add any oral meds at this time

## 2014-06-23 NOTE — Assessment & Plan Note (Signed)
He appears to have had a viral GE, will observe for now

## 2014-06-24 ENCOUNTER — Ambulatory Visit (HOSPITAL_COMMUNITY): Payer: Medicare Other | Attending: Internal Medicine | Admitting: Radiology

## 2014-06-24 ENCOUNTER — Telehealth: Payer: Self-pay | Admitting: Internal Medicine

## 2014-06-24 ENCOUNTER — Encounter (HOSPITAL_COMMUNITY): Payer: Self-pay

## 2014-06-24 DIAGNOSIS — R072 Precordial pain: Secondary | ICD-10-CM

## 2014-06-24 DIAGNOSIS — I2510 Atherosclerotic heart disease of native coronary artery without angina pectoris: Secondary | ICD-10-CM | POA: Insufficient documentation

## 2014-06-24 DIAGNOSIS — R079 Chest pain, unspecified: Secondary | ICD-10-CM | POA: Insufficient documentation

## 2014-06-24 DIAGNOSIS — R9431 Abnormal electrocardiogram [ECG] [EKG]: Secondary | ICD-10-CM | POA: Diagnosis not present

## 2014-06-24 DIAGNOSIS — R002 Palpitations: Secondary | ICD-10-CM | POA: Diagnosis not present

## 2014-06-24 DIAGNOSIS — E119 Type 2 diabetes mellitus without complications: Secondary | ICD-10-CM | POA: Diagnosis not present

## 2014-06-24 DIAGNOSIS — J45909 Unspecified asthma, uncomplicated: Secondary | ICD-10-CM | POA: Diagnosis not present

## 2014-06-24 DIAGNOSIS — J449 Chronic obstructive pulmonary disease, unspecified: Secondary | ICD-10-CM | POA: Insufficient documentation

## 2014-06-24 DIAGNOSIS — I4891 Unspecified atrial fibrillation: Secondary | ICD-10-CM | POA: Diagnosis not present

## 2014-06-24 DIAGNOSIS — R0609 Other forms of dyspnea: Secondary | ICD-10-CM | POA: Insufficient documentation

## 2014-06-24 DIAGNOSIS — I10 Essential (primary) hypertension: Secondary | ICD-10-CM | POA: Insufficient documentation

## 2014-06-24 MED ORDER — TECHNETIUM TC 99M SESTAMIBI GENERIC - CARDIOLITE
33.0000 | Freq: Once | INTRAVENOUS | Status: AC | PRN
  Administered 2014-06-24: 33 via INTRAVENOUS

## 2014-06-24 MED ORDER — REGADENOSON 0.4 MG/5ML IV SOLN
0.4000 mg | Freq: Once | INTRAVENOUS | Status: AC
  Administered 2014-06-24: 0.4 mg via INTRAVENOUS

## 2014-06-24 NOTE — Progress Notes (Signed)
[facility] SITE 3 NUCLEAR MED [address] [place], [place] [address] [phone]    Cardiology Nuclear Med Study  Charles [name] is a 74 y.o. male     MRN : [id number]     DOB: January 10, 1941  Procedure Date: 06/24/2014  Nuclear Med Background Indication for Stress Test:  Evaluation for Ischemia, Abnormal EKG, and Follow up CAD History:  Asthma, COPD and Emphysema, CAD, AFIB, Cardiac Risk Factors: Hypertension and IDDM  Symptoms:  Chest Pain, DOE, Palpitations and SOB   Nuclear Pre-Procedure Caffeine/Decaff Intake:  None> 12 hrs NPO After: 9:00pm   Lungs:  clear O2 Sat: 98% on room air. IV 0.9% NS with Angio Cath:  22g  IV Site: R Hand x 1, tolerated well IV Started by:  Irean Hong, RN  Chest Size (in):  52 Cup Size: n/a  Height:  (1.88 m)  Weight:  281 lb (127.461 kg)  BMI:  Body mass index is 36.06 kg/(m^2). Tech Comments:  Patient took full dose of Insulin last night, and no Insulin this am. Fasting CBG was 90 at 0700 today. Last dose of Tenormin was last night. Irean Hong, RN.    Nuclear Med Study 1 or 2 day study: 2 day  Stress Test Type:  Eugenie Birks  Reading MD: N/A  Order Authorizing Provider:  Sanda Linger, MD  Resting Radionuclide: Technetium 79m Sestamibi  Resting Radionuclide Dose: 33.0 mCi on 06/25/14   Stress Radionuclide:  Technetium 51m Sestamibi  Stress Radionuclide Dose: 33.0 mCi on 06/24/14           Stress Protocol Rest HR: 43 Stress HR: 72  Rest BP: 138/59 Stress BP: 127/47  Exercise Time (min): n/a METS: n/a   Predicted Max HR: 145 bpm % Max HR: 49.66 bpm Rate Pressure Product: 9144   Dose of Adenosine (mg):  n/a Dose of Lexiscan: 0.4 mg  Dose of Atropine (mg): n/a Dose of Dobutamine: n/a mcg/kg/min (at max HR)  Stress Test Technologist: Milana Na, EMT-P  Nuclear Technologist:  Kerby Nora, CNMT     Rest Procedure:  Myocardial perfusion imaging was performed at rest 45 minutes following the intravenous  administration of Technetium 53m Sestamibi. Rest ECG: Atrial Fibrilliation and a-fib with slow ventricular response  Stress Procedure:  The patient received IV Lexiscan 0.4 mg over 15-seconds.  Technetium 29m Sestamibi injected at 30-seconds. This patient had sob and felt weird with the Lexiscan injection. Quantitative spect images were obtained after a 45 minute delay. Stress ECG: No significant change from baseline ECG  QPS Raw Data Images:  Mild diaphragmatic attenuation.  Normal left ventricular size. Stress Images:  There is decrease uptake in the basal and mid enterolateral, inferolateral and inferior wass as well as in the apical inferior and in the true apex.  Rest Images:  Decreased iptake in the basal inferolateral, inferior and apical inferior wall.  Subtraction (SDS):  SDS 6 Transient Ischemic Dilatation (Normal <1.22):  1.08 Lung/Heart Ratio (Normal <0.45):  0.36  Quantitative Gated Spect Images QGS EDV:  215 ml QGS ESV:  131 ml  Impression Exercise Capacity:  Lexiscan with no exercise. BP Response:  Normal blood pressure response. Clinical Symptoms:  There is dyspnea. ECG Impression:  No significant ST segment change suggestive of ischemia. Comparison with Prior Nuclear Study: No previous nuclear study performed  Overall Impression:  Intermediate risk stress nuclear study with two defects: 1. small scar in the basal inferior and inferolateral wall with moderate periinfarct ischemia in the basal and  mid anterolateral and mid inferolateral walls. There is a small scar in the apical inferior wall with small periinfarct ischemia in the true apex. Overall SDS 6.  LV Ejection Fraction: 39%.  LV Wall Motion:  Akinesis of the basal inferior, inferolateral walls and paradoxical septal motion.    Lars MassonELSON, Tura Roller H 06/25/2014

## 2014-06-24 NOTE — Telephone Encounter (Signed)
No - I will not have the results, he should move the appt out by a week

## 2014-06-24 NOTE — Telephone Encounter (Signed)
LMOVM to call back to reschedule appt.

## 2014-06-24 NOTE — Telephone Encounter (Signed)
Patient has an appointment tomorrow afternoon with Dr. Yetta BarreJones.  Patient states he was order a nuclear stress test.  He went today for part 1.  Part 2 is scheduled for tomorrow morning.  Patient would like to know if Dr. Yetta BarreJones will have results by then or if he needs to move his appointment out.

## 2014-06-25 ENCOUNTER — Ambulatory Visit: Payer: Medicare Other | Admitting: Internal Medicine

## 2014-06-25 ENCOUNTER — Ambulatory Visit (HOSPITAL_COMMUNITY): Payer: Medicare Other | Attending: Internal Medicine

## 2014-06-25 MED ORDER — TECHNETIUM TC 99M SESTAMIBI GENERIC - CARDIOLITE
33.0000 | Freq: Once | INTRAVENOUS | Status: AC | PRN
  Administered 2014-06-25: 33 via INTRAVENOUS

## 2014-06-26 ENCOUNTER — Encounter: Payer: Self-pay | Admitting: Internal Medicine

## 2014-06-26 ENCOUNTER — Encounter (HOSPITAL_COMMUNITY): Payer: Self-pay

## 2014-06-28 ENCOUNTER — Encounter (HOSPITAL_COMMUNITY): Payer: Self-pay

## 2014-07-01 ENCOUNTER — Encounter (HOSPITAL_COMMUNITY): Payer: Self-pay | Attending: Emergency Medicine

## 2014-07-01 ENCOUNTER — Encounter: Payer: Self-pay | Admitting: Internal Medicine

## 2014-07-01 ENCOUNTER — Ambulatory Visit (INDEPENDENT_AMBULATORY_CARE_PROVIDER_SITE_OTHER): Payer: Medicare Other | Admitting: Internal Medicine

## 2014-07-01 VITALS — BP 104/56 | HR 57 | Temp 97.7°F | Resp 16 | Ht 74.0 in | Wt 281.0 lb

## 2014-07-01 DIAGNOSIS — I2510 Atherosclerotic heart disease of native coronary artery without angina pectoris: Secondary | ICD-10-CM

## 2014-07-01 DIAGNOSIS — I25118 Atherosclerotic heart disease of native coronary artery with other forms of angina pectoris: Secondary | ICD-10-CM

## 2014-07-01 DIAGNOSIS — R911 Solitary pulmonary nodule: Secondary | ICD-10-CM | POA: Insufficient documentation

## 2014-07-01 DIAGNOSIS — G4730 Sleep apnea, unspecified: Secondary | ICD-10-CM | POA: Insufficient documentation

## 2014-07-01 DIAGNOSIS — J449 Chronic obstructive pulmonary disease, unspecified: Secondary | ICD-10-CM | POA: Insufficient documentation

## 2014-07-01 DIAGNOSIS — I10 Essential (primary) hypertension: Secondary | ICD-10-CM

## 2014-07-01 DIAGNOSIS — Z5189 Encounter for other specified aftercare: Secondary | ICD-10-CM | POA: Insufficient documentation

## 2014-07-01 MED ORDER — NITROGLYCERIN 0.4 MG SL SUBL: 0.4000 mg | SUBLINGUAL_TABLET | SUBLINGUAL | Status: DC | PRN

## 2014-07-01 MED ORDER — ASPIRIN EC 81 MG PO TBEC: 81.0000 mg | DELAYED_RELEASE_TABLET | Freq: Every day | ORAL | Status: DC

## 2014-07-01 NOTE — Assessment & Plan Note (Signed)
He does not have any CP or other s/s of ischemia He will see cardiology as soon as possible Will start asa 81 mg per day Will use NTG is if he develops any CP or diaphoresis

## 2014-07-01 NOTE — Progress Notes (Signed)
Pre visit review using our clinic review tool, if applicable. No additional management support is needed unless otherwise documented below in the visit note. 

## 2014-07-01 NOTE — Patient Instructions (Signed)
Angina Pectoris  Angina pectoris, often just called angina, is extreme discomfort in your chest, neck, or arm caused by a lack of blood in the middle and thickest layer of your heart wall (myocardium). It may feel like tightness or heavy pressure. It may feel like a crushing or squeezing pain. Some people say it feels like gas or indigestion. It may go down your shoulders, back, and arms. Some people may have symptoms other than pain. These symptoms include fatigue, shortness of breath, cold sweats, or nausea. There are four different types of angina:  · Stable angina--Stable angina usually occurs in episodes of predictable frequency and duration. It usually is brought on by physical activity, emotional stress, or excitement. These are all times when the myocardium needs more oxygen. Stable angina usually lasts a few minutes and often is relieved by taking a medicine that can be taken under your tongue (sublingually). The medicine is called nitroglycerin. Stable angina is caused by a buildup of plaque inside the arteries, which restricts blood flow to the heart muscle (atherosclerosis).  · Unstable angina--Unstable angina can occur even when your body experiences little or no physical exertion. It can occur during sleep. It can also occur at rest. It can suddenly increase in severity or frequency. It might not be relieved by sublingual nitroglycerin. It can last up to 30 minutes. The most common cause of unstable angina is a blood clot that has developed on the top of plaque buildup inside a coronary artery. It can lead to a heart attack if the blood clot completely blocks the artery.  · Microvascular angina--This type of angina is caused by a disorder of tiny blood vessels called arterioles. Microvascular angina is more common in women. The pain may be more severe and last longer than other types of angina pectoris.  · Prinzmetal or variant angina--This type of angina pectoris usually occurs when your body  experiences little or no physical exertion. It especially occurs in the early morning hours. It is caused by a spasm of your coronary artery.  HOME CARE INSTRUCTIONS   · Only take over-the-counter and prescription medicines as directed by your health care provider.  · Stay active or increase your exercise as directed by your health care provider.  · Limit strenuous activity as directed by your health care provider.  · Limit heavy lifting as directed by your health care provider.  · Maintain a healthy weight.  · Learn about and eat heart-healthy foods.  · Do not use any tobacco products including cigarettes, chewing tobacco or electronic cigarettes.  SEEK IMMEDIATE MEDICAL CARE IF:   You experience the following symptoms:  · Chest, neck, deep shoulder, or arm pain or discomfort that lasts more than a few minutes.  · Chest, neck, deep shoulder, or arm pain or discomfort that goes away and comes back, repeatedly.  · Heavy sweating with discomfort, without a noticeable cause.  · Shortness of breath or difficulty breathing.  · Angina that does not get better after a few minutes of rest or after taking sublingual nitroglycerin.  These can all be symptoms of a heart attack, which is a medical emergency! Get medical help at once. Call your local emergency service (911 in [place] immediately. Do not  drive yourself to the hospital and do not  wait to for your symptoms to go away.  MAKE SURE YOU:  · Understand these instructions.  · Will watch your condition.  · Will get help right away if you are not   doing well or get worse.  Document Released: 02/15/2005 Document Revised: 02/20/2013 Document Reviewed: 06/19/2013  [employer]® Patient Information ©2015 [employer]. This information is not intended to replace advice given to you by your health care provider. Make sure you discuss any questions you have with your health care provider.

## 2014-07-01 NOTE — Assessment & Plan Note (Signed)
His BP is well controlled 

## 2014-07-01 NOTE — Progress Notes (Signed)
Subjective:    Patient ID: Charles [name], male    DOB: 04/30/1940, 74 y.o.   MRN: [id number]  HPI Comments: He exercises at pulm rehab and has not had any CP or worsening of his DOE and SOB  Coronary Artery Disease Presents for follow-up visit. The disease course has been stable. Symptoms include shortness of breath. Pertinent negatives include no chest pain, chest pressure, chest tightness, dizziness, leg swelling, muscle weakness, palpitations or weight gain. Risk factors include decreased physical activity, diabetes, hyperlipidemia, hypertension and obesity. Past treatments include ACE inhibitors and statins. The treatment provided significant relief. Compliance with prior treatments has been variable. Compliance with diet is variable. Compliance with exercise is variable. Compliance with medications is good.      Review of Systems  Constitutional: Negative.  Negative for fever, chills, weight gain, diaphoresis, appetite change and fatigue.  HENT: Negative.   Eyes: Negative.   Respiratory: Positive for shortness of breath. Negative for cough, choking, chest tightness, wheezing and stridor.   Cardiovascular: Negative.  Negative for chest pain, palpitations and leg swelling.  Gastrointestinal: Negative.  Negative for nausea, vomiting, abdominal pain, diarrhea and constipation.  Endocrine: Negative.   Genitourinary: Negative.   Musculoskeletal: Negative.  Negative for myalgias, back pain, arthralgias, muscle weakness and neck pain.  Skin: Negative.   Allergic/Immunologic: Negative.   Neurological: Negative.  Negative for dizziness.  Hematological: Negative.  Negative for adenopathy. Does not bruise/bleed easily.  Psychiatric/Behavioral: Negative.        Objective:   Physical Exam  Constitutional: He is oriented to person, place, and time. He appears well-developed and well-nourished. No distress.  HENT:  Head: Normocephalic and atraumatic.  Mouth/Throat: Oropharynx is clear  and moist. No oropharyngeal exudate.  Eyes: Conjunctivae are normal. Right eye exhibits no discharge. Left eye exhibits no discharge. No scleral icterus.  Neck: Normal range of motion. Neck supple. No JVD present. No tracheal deviation present. No thyromegaly present.  Cardiovascular: Normal rate and intact distal pulses.  An irregularly irregular rhythm present. Exam reveals no gallop and no friction rub.   Murmur heard.  Systolic murmur is present with a grade of 1/6   No diastolic murmur is present  Pulmonary/Chest: Effort normal and breath sounds normal. No stridor. No respiratory distress. He has no wheezes. He has no rales. He exhibits no tenderness.  Abdominal: Soft. Bowel sounds are normal. He exhibits no distension and no mass. There is no tenderness. There is no rebound and no guarding.  Musculoskeletal: Normal range of motion. He exhibits edema (trace edema in BLE). He exhibits no tenderness.  Lymphadenopathy:    He has no cervical adenopathy.  Neurological: He is oriented to person, place, and time.  Skin: Skin is warm and dry. No rash noted. He is not diaphoretic. No erythema. No pallor.  Vitals reviewed.    Lab Results  Component Value Date   WBC 7.0 06/05/2014   HGB 14.0 06/05/2014   HCT 41.9 06/05/2014   PLT 230.0 06/05/2014   GLUCOSE 236* 06/05/2014   CHOL 136 06/05/2014   TRIG 80.0 06/05/2014   HDL 24.20* 06/05/2014   LDLCALC 96 06/05/2014   ALT 14 06/05/2014   AST 32 06/05/2014   NA 136 06/05/2014   K 4.4 06/05/2014   CL 100 06/05/2014   CREATININE 1.35 06/05/2014   BUN 21 06/05/2014   CO2 29 06/05/2014   TSH 1.93 06/05/2014   PSA 1.28 07/19/2013   INR 1.5* 10/07/2011   HGBA1C 8.8* 06/05/2014  MICROALBUR 11.6* 06/05/2014       Assessment & Plan:

## 2014-07-03 ENCOUNTER — Encounter (HOSPITAL_COMMUNITY): Payer: Medicare Other

## 2014-07-05 ENCOUNTER — Encounter (HOSPITAL_COMMUNITY): Payer: Self-pay

## 2014-07-08 ENCOUNTER — Encounter (HOSPITAL_COMMUNITY): Payer: Self-pay

## 2014-07-10 ENCOUNTER — Encounter (HOSPITAL_COMMUNITY): Payer: Self-pay

## 2014-07-10 ENCOUNTER — Ambulatory Visit (INDEPENDENT_AMBULATORY_CARE_PROVIDER_SITE_OTHER): Payer: Medicare Other | Admitting: Cardiology

## 2014-07-10 ENCOUNTER — Encounter: Payer: Self-pay | Admitting: Cardiology

## 2014-07-10 VITALS — BP 142/58 | HR 58 | Ht 74.0 in | Wt 280.4 lb

## 2014-07-10 DIAGNOSIS — I350 Nonrheumatic aortic (valve) stenosis: Secondary | ICD-10-CM

## 2014-07-10 DIAGNOSIS — I10 Essential (primary) hypertension: Secondary | ICD-10-CM

## 2014-07-10 DIAGNOSIS — I25118 Atherosclerotic heart disease of native coronary artery with other forms of angina pectoris: Secondary | ICD-10-CM | POA: Diagnosis not present

## 2014-07-10 DIAGNOSIS — I2510 Atherosclerotic heart disease of native coronary artery without angina pectoris: Secondary | ICD-10-CM

## 2014-07-10 DIAGNOSIS — Z01812 Encounter for preprocedural laboratory examination: Secondary | ICD-10-CM | POA: Diagnosis not present

## 2014-07-10 DIAGNOSIS — R319 Hematuria, unspecified: Secondary | ICD-10-CM

## 2014-07-10 DIAGNOSIS — I340 Nonrheumatic mitral (valve) insufficiency: Secondary | ICD-10-CM

## 2014-07-10 DIAGNOSIS — I482 Chronic atrial fibrillation, unspecified: Secondary | ICD-10-CM

## 2014-07-10 DIAGNOSIS — Z7901 Long term (current) use of anticoagulants: Secondary | ICD-10-CM

## 2014-07-10 NOTE — Patient Instructions (Signed)
Medication Instructions:  Same  Labwork: Today (Bmet, CBCD and INR)  Testing/Procedures: Your physician has requested that you have a cardiac catheterization. Cardiac catheterization is used to diagnose and/or treat various heart conditions. Doctors may recommend this procedure for a number of different reasons. The most common reason is to evaluate chest pain. Chest pain can be a symptom of coronary artery disease (CAD), and cardiac catheterization can show whether plaque is narrowing or blocking your heart's arteries. This procedure is also used to evaluate the valves, as well as measure the blood flow and oxygen levels in different parts of your heart. For further information please visit [email or web address] Please follow instruction sheet, as given.   Follow-Up: Your physician recommends that you schedule a follow-up appointment in: will be arranged after your Cath

## 2014-07-11 ENCOUNTER — Other Ambulatory Visit: Payer: Self-pay

## 2014-07-11 ENCOUNTER — Telehealth: Payer: Self-pay

## 2014-07-11 DIAGNOSIS — I350 Nonrheumatic aortic (valve) stenosis: Secondary | ICD-10-CM

## 2014-07-11 DIAGNOSIS — I10 Essential (primary) hypertension: Secondary | ICD-10-CM

## 2014-07-11 LAB — PROTIME-INR
INR: 1 ratio (ref 0.8–1.0)
Prothrombin Time: 11.4 s (ref 9.6–13.1)

## 2014-07-11 LAB — BASIC METABOLIC PANEL
BUN: 25 mg/dL — AB (ref 6–23)
CHLORIDE: 103 meq/L (ref 96–112)
CO2: 27 mEq/L (ref 19–32)
CREATININE: 1.48 mg/dL (ref 0.40–1.50)
Calcium: 9.2 mg/dL (ref 8.4–10.5)
GFR: 49.18 mL/min — AB (ref >60.00)
GLUCOSE: 203 mg/dL — AB (ref 70–99)
Potassium: 4.1 mEq/L (ref 3.5–5.1)
Sodium: 139 mEq/L (ref 135–145)

## 2014-07-11 LAB — CBC WITH DIFFERENTIAL/PLATELET
Basophils Absolute: 0 10*3/uL (ref 0.0–0.1)
Basophils Relative: 0.5 % (ref 0.0–3.0)
Eosinophils Absolute: 0.2 10*3/uL (ref 0.0–0.7)
Eosinophils Relative: 2.4 % (ref 0.0–5.0)
HCT: 41.1 % (ref 39.0–52.0)
Hemoglobin: 13.7 g/dL (ref 13.0–17.0)
LYMPHS ABS: 1.2 10*3/uL (ref 0.7–4.0)
LYMPHS PCT: 16.8 % (ref 12.0–46.0)
MCHC: 33.2 g/dL (ref 30.0–36.0)
MCV: 89.2 fl (ref 78.0–100.0)
Monocytes Absolute: 0.4 10*3/uL (ref 0.1–1.0)
Monocytes Relative: 5.1 % (ref 3.0–12.0)
Neutro Abs: 5.5 10*3/uL (ref 1.4–7.7)
Neutrophils Relative %: 75.2 % (ref 43.0–77.0)
Platelets: 208 10*3/uL (ref 150.0–400.0)
RBC: 4.61 Mil/uL (ref 4.22–5.81)
RDW: 15.3 % (ref 11.5–15.5)
WBC: 7.4 10*3/uL (ref 4.0–10.5)

## 2014-07-11 NOTE — Progress Notes (Signed)
Cardiology Office Note   Date:  07/11/2014   ID:  Charles [name], DOB [birth year], MRN [id number]  PCP:  Scarlette Calico, MD  Cardiologist:  Dola Argyle, MD   Chief Complaint  Patient presents with  . Appointment    Follow-up atrial fibrillation and aortic stenosis and abnormal nuclear stress study  . Labs Only      History of Present Illness: Charles [name] is a 74 y.o. male who presents to follow up atrial fibrillation and an abnormal stress nuclear study. Historically the patient had undergone cardiac catheterization elsewhere in the remote past. He tells me that he did not have significant narrowings. He has aortic stenosis. His last echo in 2014 showed a mean gradient of 25 mmHg with good LV function. It was felt that he had moderate aortic stenosis. He has atrial fibrillation. He has had 2 GI bleeds in the past and does not want to be anticoagulated.  Recently he's had some shortness of breath. He also had one episode of chest discomfort. He saw his primary physician several days later. A stress nuclear study was ordered. This was done June 24, 2014. It showed small scar at the base inferior and inferolateral walls with moderate peri-infarct ischemia. There was also mild scar in the apical inferior segment. Ejection fraction was 39%. There is description of akinesis of the base inferior and inferolateral walls. There was also suggestion of paradoxical septal motion. As of today there has not yet been a follow-up echo. I will try to arrange this before his catheterization.  The patient I had a careful discussion about the symptoms that he had an about his test result. It is concerning for the possibility that he may of had a silent infarct.    Past Medical History  Diagnosis Date  . Morbid obesity   . Diverticulosis of colon   . Acute pancreatitis     Gallstone pancreatitis  . COPD (chronic obstructive pulmonary disease)   . OSA (obstructive sleep apnea)     Dr. Lamonte Sakai    . CAD (coronary artery disease)   . Cataract   . Nonexudative senile macular degeneration of retina   . Solitary pulmonary nodule     Dr. Lamonte Sakai  Followed by serial CTs  . Hyperlipidemia   . Osteoarthritis   . Anemia   . Diabetes mellitus, type 2   . Asthma   . Hypertension   . Stroke   1996     Remote past  . Ejection fraction     EF 55-60%, echo, March, 2013, Good RV function  . Aortic stenosis     Mild, echo, March, 2013,   . Atrial fibrillation     New diagnosis August 11, 2011  . DOE (dyspnea on exertion)     Dr. Lamonte Sakai,  obesity and deconditioning  playing a major role. No definite pulmonary hypertension on echo.  . Edema     Probably from venous insufficiency    Past Surgical History  Procedure Laterality Date  . Cholecystectomy    . Tonsillectomy      Patient Active Problem List   Diagnosis Date Noted  . BPH (benign prostatic hyperplasia) 07/19/2013  . Mitral regurgitation 10/12/2012  . COPD (chronic obstructive pulmonary disease)   . Hyperlipidemia with target LDL less than 100   . 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 Hematuria 11/29/2011  . Routine health maintenance 11/29/2011  . Atrial fibrillation   . Hypertension   .  Aortic stenosis   . Solitary pulmonary nodule   . OSA on CPAP 11/24/2009  . MACULAR DEGENERATION, SENILE, NONEXUDATIVE 11/24/2009  . Coronary atherosclerosis 11/24/2009  . OSTEOARTHRITIS, MODERATE 11/24/2009  . Obesity, Class III, BMI 40-49.9 (morbid obesity) 11/18/2009      Current Outpatient Prescriptions  Medication Sig Dispense Refill  . Aflibercept 2 MG/0.05ML SOLN Inject into the eye as directed. Injection into right eye every 12 weeks    . albuterol (PROVENTIL HFA;VENTOLIN HFA) 108 (90 BASE) MCG/ACT inhaler Inhale 2 puffs into the lungs every 6 (six) hours as needed for wheezing or shortness of breath.    Marland Kitchen aspirin EC 81 MG tablet Take 1 tablet (81 mg total) by mouth  daily. 90 tablet 3  . atenolol (TENORMIN) 50 MG tablet Take 25 mg by mouth daily. Taking one half tablet (25 mg) by mouth daily    . B-D INS SYRINGE 0.5CC/30GX1/2" 30G X 1/2" 0.5 ML MISC USE AS DIRECTED. 200 each PRN  . Blood Glucose Monitoring Suppl (TRUETRACK BLOOD GLUCOSE) W/DEVICE KIT Test up to TID Dx: E11.39 1 each 2  . diphenoxylate-atropine (LOMOTIL) 2.5-0.025 MG per tablet Take 1 tablet by mouth 4 (four) times daily as needed for diarrhea or loose stools.     Marland Kitchen glucose blood (TRUETEST TEST) test strip Use as directed three times daily to check blood sugar.  Diagnosis code E11.39. 300 each 1  . glucose blood (TRUETRACK TEST) test strip Test up to TID Dx:E11.39 100 each 12  . guaiFENesin (MUCINEX) 600 MG 12 hr tablet Take 600 mg by mouth daily.    . hydrochlorothiazide (HYDRODIURIL) 25 MG tablet Take 25 mg by mouth daily.    . Insulin Isophane & Regular Human (HUMULIN 70/30 KWIKPEN) (70-30) 100 UNIT/ML PEN Inject 30 Units into the skin 2 (two) times daily with a meal. 15 mL 11  . lisinopril (PRINIVIL,ZESTRIL) 20 MG tablet Take 20 mg by mouth daily.    . metFORMIN (GLUCOPHAGE) 1000 MG tablet Take 1,000 mg by mouth 2 (two) times daily with a meal.    . minocycline (MINOCIN,DYNACIN) 100 MG capsule Take 100 mg by mouth 3 (three) times a week.     . nitroGLYCERIN (NITROSTAT) 0.4 MG SL tablet Place 1 tablet (0.4 mg total) under the tongue every 5 (five) minutes as needed for chest pain. 50 tablet 3  . Omega-3 Fatty Acids (FISH OIL) 1000 MG CAPS Take 1 capsule by mouth daily.      Marland Kitchen Respiratory Therapy Supplies (FLUTTER) DEVI Use as directed 1 each 0  . simvastatin (ZOCOR) 40 MG tablet Take 40 mg by mouth daily at 6 PM.     No current facility-administered medications for this visit.    Allergies:   Review of patient's allergies indicates no known allergies.    Social History:  The patient  reports that he quit smoking about 24 years ago. His smoking use included Cigarettes. He has a 105  pack-year smoking history. He has never used smokeless tobacco. He reports that he does not drink alcohol or use illicit drugs.   Family History:  The patient's family history includes Cancer in his father; Diabetes in his mother; Heart attack in his mother; Heart disease in his mother; Heart failure in his father; Hypertension in his mother.    ROS:  Please see the history of present illness.    Patient denies fever, chills, headache, sweats, rash, change in vision, change in hearing, cough, nausea or vomiting, urinary symptoms. All other  systems are reviewed and are negative.    PHYSICAL EXAM: VS:  BP 142/58 mmHg  Pulse 58  Ht 6' 2" (1.88 m)  Wt 280 lb 6.4 oz (127.189 kg)  BMI 35.99 kg/m2  SpO2 96% , The patient is significantly overweight. He is oriented to person time and place. Affect is normal. Head is atraumatic. Sclera and conjunctiva are normal. There is no jugulovenous distention. Lungs are clear. Respiratory effort is not labored. Cardiac exam reveals S1 and S2. There is a crescendo decrescendo systolic murmur. Abdomen is protuberant but soft. There is trace peripheral edema area he walks with a cane. He has some discoloration in his lower legs from chronic venous insufficiency.  EKG:   I reviewed his last EKG and there is no diagnostic change.   Recent Labs: 06/05/2014: ALT 14; TSH 1.93 07/10/2014: BUN 25*; Creatinine 1.48; Hemoglobin 13.7; Platelets 208.0; Potassium 4.1; Sodium 139    Lipid Panel    Component Value Date/Time   CHOL 136 06/05/2014 1211   TRIG 80.0 06/05/2014 1211   HDL 24.20* 06/05/2014 1211   CHOLHDL 6 06/05/2014 1211   VLDL 16.0 06/05/2014 1211   LDLCALC 96 06/05/2014 1211      Wt Readings from Last 3 Encounters:  07/10/14 280 lb 6.4 oz (127.189 kg)  07/01/14 281 lb (127.461 kg)  06/24/14 281 lb (127.461 kg)      Current medicines are reviewed  The patient understands his medications.     ASSESSMENT AND PLAN:

## 2014-07-11 NOTE — Telephone Encounter (Signed)
I have decided that it will be best to obtain a 2-D echo before his cardiac cath on May 19. Please schedule 2-D echo to assess LV function and severity of aortic stenosis. He does have aortic stenosis and this can be used as a diagnosis. We should be able to get this scheduled. His cath is not until next week because he needs to have some type of eye injection next Tuesday. If we can work around that, we can get echo data to be available before the catheterization.     Per Dr Myrtis SerKatz the pt has been advised of all the above and he verbalized understanding and is in agreement with plan. He is aware that someone from this office will contact him to arrange date and time of his Echo and that it must be done before his Cath on 5/19. Echo has been ordered and a high priority [facility] has been [facility] to contact the pt and arrange Echo. I also called and made Neysa BonitoChristy (at check out) aware that the pt needs Echo to be done and resulted before his Cath that is scheduled for 5/19. Neysa BonitoChristy states that she will contact him tomorrow.

## 2014-07-11 NOTE — Assessment & Plan Note (Signed)
There is chronic atrial fibrillation with good rate control. The patient has a history of 2 GI bleeds and does not want to be anticoagulated.

## 2014-07-11 NOTE — H&P (Signed)
Charles [name]  07/10/2014 3:00 PM  Office Visit  MRN:  [id number]   Description: Male DOB: January 25, 1941  Provider: Carlena Bjornstad, MD  Department: [facility]       Vital Signs  Most recent update: 07/10/2014 3:36 PM by Lynelle Smoke T Wysor    BP Pulse Ht Wt BMI SpO2    142/58 mmHg 58 _0  (1.88 m) 280 lb 6.4 oz (127.189 kg) 35.99 kg/m2 96%    Vitals History     Progress Notes      Carlena Bjornstad, MD at 07/11/2014 12:52 PM     Status: Sign at close encounter       Expand All Collapse All      Cardiology Office Note   Date: 07/11/2014   ID: Charles [name], DOB 05/11/1940, MRN [id number]  PCP: Scarlette Calico, MD Cardiologist: Dola Argyle, MD   Chief Complaint  Patient presents with  . Appointment    Follow-up atrial fibrillation and aortic stenosis and abnormal nuclear stress study  . Labs Only     History of Present Illness: Charles [name] is a 74 y.o. male who presents to follow up atrial fibrillation and an abnormal stress nuclear study. Historically the patient had undergone cardiac catheterization elsewhere in the remote past. He tells me that he did not have significant narrowings. He has aortic stenosis. His last echo in 2014 showed a mean gradient of 25 mmHg with good LV function. It was felt that he had moderate aortic stenosis. He has atrial fibrillation. He has had 2 GI bleeds in the past and does not want to be anticoagulated.  Recently he's had some shortness of breath. He also had one episode of chest discomfort. He saw his primary physician several days later. A stress nuclear study was ordered. This was done June 24, 2014. It showed small scar at the base inferior and inferolateral walls with moderate peri-infarct ischemia. There was also mild scar in the apical inferior segment. Ejection fraction was 39%. There is description of akinesis of the base inferior and inferolateral walls. There was also suggestion of  paradoxical septal motion. As of today there has not yet been a follow-up echo. I will try to arrange this before his catheterization.  The patient I had a careful discussion about the symptoms that he had an about his test result. It is concerning for the possibility that he may of had a silent infarct.    Past Medical History  Diagnosis Date  . Morbid obesity   . Diverticulosis of colon   . Acute pancreatitis     Gallstone pancreatitis  . COPD (chronic obstructive pulmonary disease)   . OSA (obstructive sleep apnea)     Dr. Lamonte Sakai  . CAD (coronary artery disease)   . Cataract   . Nonexudative senile macular degeneration of retina   . Solitary pulmonary nodule     Dr. Lamonte Sakai Followed by serial CTs  . Hyperlipidemia   . Osteoarthritis   . Anemia   . Diabetes mellitus, type 2   . Asthma   . Hypertension   . Stroke 1996     Remote past  . Ejection fraction     EF 55-60%, echo, March, 2013, Good RV function  . Aortic stenosis     Mild, echo, March, 2013,   . Atrial fibrillation     New diagnosis August 11, 2011  . DOE (dyspnea on exertion)     Dr. Lamonte Sakai, obesity and deconditioning playing a major role.  No definite pulmonary hypertension on echo.  . Edema     Probably from venous insufficiency    Past Surgical History  Procedure Laterality Date  . Cholecystectomy    . Tonsillectomy      Patient Active Problem List   Diagnosis Date Noted  . BPH (benign prostatic hyperplasia) 07/19/2013  . Mitral regurgitation 10/12/2012  . COPD (chronic obstructive pulmonary disease)   . Hyperlipidemia with target LDL less than 100   . 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 Hematuria 11/29/2011  . Routine health maintenance 11/29/2011  . Atrial fibrillation   . Hypertension   .  Aortic stenosis   . Solitary pulmonary nodule   . OSA on CPAP 11/24/2009  . MACULAR DEGENERATION, SENILE, NONEXUDATIVE 11/24/2009  . Coronary atherosclerosis 11/24/2009  . OSTEOARTHRITIS, MODERATE 11/24/2009  . Obesity, Class III, BMI 40-49.9 (morbid obesity) 11/18/2009      Current Outpatient Prescriptions  Medication Sig Dispense Refill  . Aflibercept 2 MG/0.05ML SOLN Inject into the eye as directed. Injection into right eye every 12 weeks    . albuterol (PROVENTIL HFA;VENTOLIN HFA) 108 (90 BASE) MCG/ACT inhaler Inhale 2 puffs into the lungs every 6 (six) hours as needed for wheezing or shortness of breath.    Marland Kitchen aspirin EC 81 MG tablet Take 1 tablet (81 mg total) by mouth daily. 90 tablet 3  . atenolol (TENORMIN) 50 MG tablet Take 25 mg by mouth daily. Taking one half tablet (25 mg) by mouth daily    . B-D INS SYRINGE 0.5CC/30GX1/2" 30G X 1/2" 0.5 ML MISC USE AS DIRECTED. 200 each PRN  . Blood Glucose Monitoring Suppl (TRUETRACK BLOOD GLUCOSE) W/DEVICE KIT Test up to TID Dx: E11.39 1 each 2  . diphenoxylate-atropine (LOMOTIL) 2.5-0.025 MG per tablet Take 1 tablet by mouth 4 (four) times daily as needed for diarrhea or loose stools.     Marland Kitchen glucose blood (TRUETEST TEST) test strip Use as directed three times daily to check blood sugar. Diagnosis code E11.39. 300 each 1  . glucose blood (TRUETRACK TEST) test strip Test up to TID Dx:E11.39 100 each 12  . guaiFENesin (MUCINEX) 600 MG 12 hr tablet Take 600 mg by mouth daily.    . hydrochlorothiazide (HYDRODIURIL) 25 MG tablet Take 25 mg by mouth daily.    . Insulin Isophane & Regular Human (HUMULIN 70/30 KWIKPEN) (70-30) 100 UNIT/ML PEN Inject 30 Units into the skin 2 (two) times daily with a meal. 15 mL 11  . lisinopril (PRINIVIL,ZESTRIL) 20 MG tablet Take 20 mg by mouth daily.    . metFORMIN (GLUCOPHAGE) 1000 MG tablet Take 1,000 mg by mouth 2 (two)  times daily with a meal.    . minocycline (MINOCIN,DYNACIN) 100 MG capsule Take 100 mg by mouth 3 (three) times a week.     . nitroGLYCERIN (NITROSTAT) 0.4 MG SL tablet Place 1 tablet (0.4 mg total) under the tongue every 5 (five) minutes as needed for chest pain. 50 tablet 3  . Omega-3 Fatty Acids (FISH OIL) 1000 MG CAPS Take 1 capsule by mouth daily.     Marland Kitchen Respiratory Therapy Supplies (FLUTTER) DEVI Use as directed 1 each 0  . simvastatin (ZOCOR) 40 MG tablet Take 40 mg by mouth daily at 6 PM.     No current facility-administered medications for this visit.    Allergies: Review of patient's allergies indicates no known allergies.    Social History: The patient  reports that  he quit smoking about 24 years ago. His smoking use included Cigarettes. He has a 105 pack-year smoking history. He has never used smokeless tobacco. He reports that he does not drink alcohol or use illicit drugs.   Family History: The patient's family history includes Cancer in his father; Diabetes in his mother; Heart attack in his mother; Heart disease in his mother; Heart failure in his father; Hypertension in his mother.    ROS: Please see the history of present illness. Patient denies fever, chills, headache, sweats, rash, change in vision, change in hearing, cough, nausea or vomiting, urinary symptoms. All other systems are reviewed and are negative.    PHYSICAL EXAM: VS: BP 142/58 mmHg  Pulse 58  Ht _0  (1.88 m)  Wt 280 lb 6.4 oz (127.189 kg)  BMI 35.99 kg/m2  SpO2 96% , The patient is significantly overweight. He is oriented to person time and place. Affect is normal. Head is atraumatic. Sclera and conjunctiva are normal. There is no jugulovenous distention. Lungs are clear. Respiratory effort is not labored. Cardiac exam reveals S1 and S2. There is a crescendo decrescendo systolic murmur. Abdomen is protuberant but soft. There is trace peripheral edema area he  walks with a cane. He has some discoloration in his lower legs from chronic venous insufficiency.  EKG: I reviewed his last EKG and there is no diagnostic change.   Recent Labs: 06/05/2014: ALT 14; TSH 1.93 07/10/2014: BUN 25*; Creatinine 1.48; Hemoglobin 13.7; Platelets 208.0; Potassium 4.1; Sodium 139    Lipid Panel  Labs (Brief)       Component Value Date/Time   CHOL 136 06/05/2014 1211   TRIG 80.0 06/05/2014 1211   HDL 24.20* 06/05/2014 1211   CHOLHDL 6 06/05/2014 1211   VLDL 16.0 06/05/2014 1211   LDLCALC 96 06/05/2014 1211       Wt Readings from Last 3 Encounters:  07/10/14 280 lb 6.4 oz (127.189 kg)  07/01/14 281 lb (127.461 kg)  06/24/14 281 lb (127.461 kg)      Current medicines are reviewed The patient understands his medications.     ASSESSMENT AND PLAN:              Aortic stenosis - Carlena Bjornstad, MD at 07/11/2014 12:58 PM     Status: Written Related Problem: Aortic stenosis   Expand All Collapse All   Previously his aortic stenosis was felt to be moderate. He is scheduled for catheterization on Jul 18, 2014. I'm hopeful that we can have an echo completed by then.            Atrial fibrillation - Carlena Bjornstad, MD at 07/11/2014 12:59 PM     Status: Written Related Problem: Atrial fibrillation   Expand All Collapse All   There is chronic atrial fibrillation with good rate control. The patient has a history of 2 GI bleeds and does not want to be anticoagulated.            Coronary atherosclerosis - Carlena Bjornstad, MD at 07/11/2014 1:00 PM     Status: Edited Related Problem: Coronary atherosclerosis   Expand All Collapse All   According the patient, catheterizations in the remote past showed no major obstructive disease. He now has had an episode of chest pain and he has an abnormal nuclear scan. There is suggestion of decreased ejection fraction compared to the past and wall motion  abnormalities. There is also some peri-infarct ischemia. We need to proceed with cardiac catheterization to  assess his coronaries. We will also get hemodynamic data concerning aortic stenosis. I will try to have an echo completed by that time also to help with the assessment of his aortic stenosis.  As part of today's evaluation I spent greater than 25 minutes with his total care. More than half of this time was with direct contact with the patient talking about his nuclear study in talking about proceeding with cardiac catheterization.         Revision History       Date/Time User Action    > 07/11/2014 1:03 PM Carlena Bjornstad, MD Edit     07/11/2014 1:00 PM Carlena Bjornstad, MD Create              Hematuria - Carlena Bjornstad, MD at 07/11/2014 1:01 PM     Status: Written Related Problem: Hematuria   Expand All Collapse All   He had limited hematuria in the past while on Xarelto. urology workup was negative. Hopefully this will not be a problem if he needs dual antiplatelet therapy.            Mitral regurgitation - Carlena Bjornstad, MD at 07/11/2014 1:02 PM     Status: Written Related Problem: Mitral regurgitation   Expand All Collapse All   There is history of mild mitral regurgitation in the past. This will also be reassessed by echo and cath.             Referring Provider     Janith Lima, MD     Diagnoses     Atherosclerosis of native coronary artery of native heart with other form of angina pectoris - Primary    ICD-9-CM: 414.01, 413.9 ICD-10-CM: I25.118    Pre-procedure lab exam     ICD-9-CM: V72.63 ICD-10-CM: Z01.812    Anticoagulated     ICD-9-CM: V58.61 ICD-10-CM: Z79.01    Essential hypertension     ICD-9-CM: 401.9 ICD-10-CM: I10    Aortic stenosis     ICD-9-CM: 424.1 ICD-10-CM: I35.0    Chronic atrial fibrillation     ICD-9-CM: 427.31 ICD-10-CM: I48.2    Hematuria     ICD-9-CM:  599.70 ICD-10-CM: R31.9    Mitral regurgitation     ICD-9-CM: 424.0 ICD-10-CM: I34.0       Reason for Visit     Appointment    Follow-up atrial fibrillation and aortic stenosis and abnormal nuclear stress study    Labs Only    Reason for Visit History        Discontinued Medications       Reason for Discontinue    Aflibercept (EYLEA) 2 MG/0.05ML SOLN Entry Error    atenolol (TENORMIN) 50 MG tablet Entry Error    PROAIR HFA 108 (90 BASE) MCG/ACT inhaler Entry Error    lisinopril (PRINIVIL,ZESTRIL) 20 MG tablet Entry Error    metFORMIN (GLUCOPHAGE) 1000 MG tablet Entry Error    simvastatin (ZOCOR) 40 MG tablet Entry Error    minocycline (MINOCIN,DYNACIN) 100 MG capsule Entry Error    hydrochlorothiazide (HYDRODIURIL) 25 MG tablet Entry Error      Medication Notes     diphenoxylate-atropine (LOMOTIL) 2.5-0.025 MG per tablet     Tammy T Wysor 07/10/2014 3:30 PM Received from: External Pharmacy      minocycline (MINOCIN,DYNACIN) 100 MG capsule     Tammy T Wysor 07/10/2014 3:30 PM Received from: External Pharmacy        Level of Service     PR  OFFICE OUTPATIENT VISIT 25 MINUTES [[id number]]        AVS Reports     Date/Time Report Action User    07/10/2014 4:05 PM After Visit Summary Printed Deliah Boston Via, LPN      Patient Instructions     Medication Instructions:  Same  Labwork: Today (Bmet, CBCD and INR)  Testing/Procedures: Your physician has requested that you have a cardiac catheterization. Cardiac catheterization is used to diagnose and/or treat various heart conditions. Doctors may recommend this procedure for a number of different reasons. The most common reason is to evaluate chest pain. Chest pain can be a symptom of coronary artery disease (CAD), and cardiac catheterization can show whether plaque is narrowing or blocking your heart's arteries. This procedure is also used to evaluate the valves, as well  as measure the blood flow and oxygen levels in different parts of your heart. For further information please visit [email or web address]. Please follow instruction sheet, as given.   Follow-Up: Your physician recommends that you schedule a follow-up appointment in: will be arranged after your Cath             Routing History     There are no sent or routed communications associated with this encounter.     Previous Visit       Provider Department Encounter #    07/10/2014 1:15 PM Dola Argyle, MD [facility] [id number]     We will be ready to proceed with cardiac catheterization on Jul 18, 2014

## 2014-07-11 NOTE — Assessment & Plan Note (Signed)
There is history of mild mitral regurgitation in the past. This will also be reassessed by echo and cath.

## 2014-07-11 NOTE — Assessment & Plan Note (Signed)
He had limited hematuria in the past while on Xarelto. urology workup was negative. Hopefully this will not be a problem if he needs dual antiplatelet therapy.

## 2014-07-11 NOTE — Assessment & Plan Note (Addendum)
According the patient, catheterizations in the remote past showed no major obstructive disease. He now has had an episode of chest pain and he has an abnormal nuclear scan. There is suggestion of decreased ejection fraction compared to the past and wall motion abnormalities. There is also some peri-infarct ischemia. We need to proceed with cardiac catheterization to assess his coronaries. We will also get hemodynamic data concerning aortic stenosis. I will try to have an echo completed by that time also to help with the assessment of his aortic stenosis.  As part of today's evaluation I spent greater than 25 minutes with his total care. More than half of this time was with direct contact with the patient talking about his nuclear study in talking about proceeding with cardiac catheterization.

## 2014-07-11 NOTE — Assessment & Plan Note (Signed)
Previously his aortic stenosis was felt to be moderate. He is scheduled for catheterization on Jul 18, 2014. I'm hopeful that we can have an echo completed by then.

## 2014-07-12 ENCOUNTER — Encounter (HOSPITAL_COMMUNITY): Payer: Self-pay

## 2014-07-15 ENCOUNTER — Encounter (HOSPITAL_COMMUNITY): Admission: RE | Admit: 2014-07-15 | Payer: Self-pay | Source: Ambulatory Visit

## 2014-07-15 ENCOUNTER — Other Ambulatory Visit: Payer: Self-pay

## 2014-07-15 ENCOUNTER — Ambulatory Visit (HOSPITAL_COMMUNITY): Payer: Medicare Other | Attending: Cardiovascular Disease

## 2014-07-15 ENCOUNTER — Other Ambulatory Visit (INDEPENDENT_AMBULATORY_CARE_PROVIDER_SITE_OTHER): Payer: Medicare Other | Admitting: *Deleted

## 2014-07-15 DIAGNOSIS — I350 Nonrheumatic aortic (valve) stenosis: Secondary | ICD-10-CM | POA: Diagnosis present

## 2014-07-15 DIAGNOSIS — Z87891 Personal history of nicotine dependence: Secondary | ICD-10-CM | POA: Diagnosis not present

## 2014-07-15 DIAGNOSIS — E119 Type 2 diabetes mellitus without complications: Secondary | ICD-10-CM | POA: Insufficient documentation

## 2014-07-15 DIAGNOSIS — I10 Essential (primary) hypertension: Secondary | ICD-10-CM

## 2014-07-15 DIAGNOSIS — Z794 Long term (current) use of insulin: Secondary | ICD-10-CM | POA: Insufficient documentation

## 2014-07-15 DIAGNOSIS — E785 Hyperlipidemia, unspecified: Secondary | ICD-10-CM | POA: Insufficient documentation

## 2014-07-15 LAB — BASIC METABOLIC PANEL
BUN: 21 mg/dL (ref 6–23)
CALCIUM: 9.1 mg/dL (ref 8.4–10.5)
CHLORIDE: 103 meq/L (ref 96–112)
CO2: 31 mEq/L (ref 19–32)
CREATININE: 1.28 mg/dL (ref 0.40–1.50)
GFR: 58.15 mL/min — ABNORMAL LOW (ref >60.00)
GLUCOSE: 175 mg/dL — AB (ref 70–99)
Potassium: 4.4 mEq/L (ref 3.5–5.1)
Sodium: 138 mEq/L (ref 135–145)

## 2014-07-15 NOTE — Addendum Note (Signed)
Addended by: Tonita PhoenixBOWDEN, Malvina Schadler K on: 07/15/2014 02:31 PM   Modules accepted: Orders

## 2014-07-17 ENCOUNTER — Encounter (HOSPITAL_COMMUNITY): Payer: Self-pay

## 2014-07-17 DIAGNOSIS — R9430 Abnormal result of cardiovascular function study, unspecified: Secondary | ICD-10-CM | POA: Insufficient documentation

## 2014-07-18 ENCOUNTER — Other Ambulatory Visit: Payer: Self-pay

## 2014-07-18 ENCOUNTER — Encounter (HOSPITAL_COMMUNITY)
Admission: RE | Disposition: A | Discharge status: Home / Self Care | Payer: Medicare Other | Source: Ambulatory Visit | Attending: Cardiovascular Disease

## 2014-07-18 ENCOUNTER — Ambulatory Visit (HOSPITAL_COMMUNITY)
Admission: RE | Admit: 2014-07-18 | Discharge: 2014-07-18 | Disposition: A | Discharge status: Home / Self Care | Payer: Medicare Other | Source: Ambulatory Visit | Attending: Cardiovascular Disease | Admitting: Cardiovascular Disease

## 2014-07-18 ENCOUNTER — Encounter (HOSPITAL_COMMUNITY): Payer: Self-pay | Admitting: Cardiovascular Disease

## 2014-07-18 DIAGNOSIS — K5730 Diverticulosis of large intestine without perforation or abscess without bleeding: Secondary | ICD-10-CM | POA: Insufficient documentation

## 2014-07-18 DIAGNOSIS — Z87891 Personal history of nicotine dependence: Secondary | ICD-10-CM | POA: Diagnosis not present

## 2014-07-18 DIAGNOSIS — G4733 Obstructive sleep apnea (adult) (pediatric): Secondary | ICD-10-CM | POA: Insufficient documentation

## 2014-07-18 DIAGNOSIS — Z9049 Acquired absence of other specified parts of digestive tract: Secondary | ICD-10-CM | POA: Insufficient documentation

## 2014-07-18 DIAGNOSIS — I350 Nonrheumatic aortic (valve) stenosis: Secondary | ICD-10-CM | POA: Insufficient documentation

## 2014-07-18 DIAGNOSIS — Z7982 Long term (current) use of aspirin: Secondary | ICD-10-CM | POA: Diagnosis not present

## 2014-07-18 DIAGNOSIS — M1990 Unspecified osteoarthritis, unspecified site: Secondary | ICD-10-CM | POA: Insufficient documentation

## 2014-07-18 DIAGNOSIS — Z01812 Encounter for preprocedural laboratory examination: Secondary | ICD-10-CM

## 2014-07-18 DIAGNOSIS — I10 Essential (primary) hypertension: Secondary | ICD-10-CM | POA: Insufficient documentation

## 2014-07-18 DIAGNOSIS — Z6835 Body mass index (BMI) 35.0-35.9, adult: Secondary | ICD-10-CM | POA: Insufficient documentation

## 2014-07-18 DIAGNOSIS — D649 Anemia, unspecified: Secondary | ICD-10-CM | POA: Insufficient documentation

## 2014-07-18 DIAGNOSIS — J449 Chronic obstructive pulmonary disease, unspecified: Secondary | ICD-10-CM | POA: Diagnosis not present

## 2014-07-18 DIAGNOSIS — E785 Hyperlipidemia, unspecified: Secondary | ICD-10-CM | POA: Diagnosis not present

## 2014-07-18 DIAGNOSIS — E11319 Type 2 diabetes mellitus with unspecified diabetic retinopathy without macular edema: Secondary | ICD-10-CM | POA: Insufficient documentation

## 2014-07-18 DIAGNOSIS — J45909 Unspecified asthma, uncomplicated: Secondary | ICD-10-CM | POA: Insufficient documentation

## 2014-07-18 DIAGNOSIS — Z8673 Personal history of transient ischemic attack (TIA), and cerebral infarction without residual deficits: Secondary | ICD-10-CM | POA: Diagnosis not present

## 2014-07-18 DIAGNOSIS — I2510 Atherosclerotic heart disease of native coronary artery without angina pectoris: Secondary | ICD-10-CM | POA: Diagnosis not present

## 2014-07-18 DIAGNOSIS — I4891 Unspecified atrial fibrillation: Secondary | ICD-10-CM | POA: Insufficient documentation

## 2014-07-18 HISTORY — PX: CARDIAC CATHETERIZATION: SHX172

## 2014-07-18 LAB — GLUCOSE, CAPILLARY: GLUCOSE-CAPILLARY: 93 mg/dL (ref 65–99)

## 2014-07-18 SURGERY — LEFT HEART CATH AND CORONARY ANGIOGRAPHY
Anesthesia: LOCAL

## 2014-07-18 MED ORDER — SODIUM CHLORIDE 0.9 % IJ SOLN: 3.0000 mL | INTRAMUSCULAR | Status: DC | PRN

## 2014-07-18 MED ORDER — VERAPAMIL HCL 2.5 MG/ML IV SOLN
INTRAVENOUS | Status: AC
  Filled 2014-07-18: qty 2

## 2014-07-18 MED ORDER — FENTANYL CITRATE (PF) 100 MCG/2ML IJ SOLN
INTRAMUSCULAR | Status: DC | PRN
  Administered 2014-07-18: 50 ug via INTRAVENOUS

## 2014-07-18 MED ORDER — SODIUM CHLORIDE 0.9 % IV SOLN: 250.0000 mL | INTRAVENOUS | Status: DC | PRN

## 2014-07-18 MED ORDER — FUROSEMIDE 10 MG/ML IJ SOLN
INTRAMUSCULAR | Status: DC | PRN
  Administered 2014-07-18: 40 mg via INTRAVENOUS

## 2014-07-18 MED ORDER — ALBUTEROL SULFATE (2.5 MG/3ML) 0.083% IN NEBU
3.0000 mL | INHALATION_SOLUTION | Freq: Once | RESPIRATORY_TRACT | Status: DC
  Filled 2014-07-18: qty 3

## 2014-07-18 MED ORDER — MIDAZOLAM HCL 2 MG/2ML IJ SOLN
INTRAMUSCULAR | Status: AC
  Filled 2014-07-18: qty 2

## 2014-07-18 MED ORDER — VERAPAMIL HCL 2.5 MG/ML IV SOLN
INTRAVENOUS | Status: DC | PRN
  Administered 2014-07-18: 09:00:00 via INTRA_ARTERIAL

## 2014-07-18 MED ORDER — ALBUTEROL SULFATE HFA 108 (90 BASE) MCG/ACT IN AERS
1.0000 | INHALATION_SPRAY | Freq: Once | RESPIRATORY_TRACT | Status: AC
  Administered 2014-07-18: 1 via RESPIRATORY_TRACT
  Filled 2014-07-18: qty 6.7

## 2014-07-18 MED ORDER — HEPARIN SODIUM (PORCINE) 1000 UNIT/ML IJ SOLN
INTRAMUSCULAR | Status: AC
  Filled 2014-07-18: qty 1

## 2014-07-18 MED ORDER — SODIUM CHLORIDE 0.9 % WEIGHT BASED INFUSION: 1.0000 mL/kg/h | INTRAVENOUS | Status: DC

## 2014-07-18 MED ORDER — IOHEXOL 350 MG/ML SOLN
INTRAVENOUS | Status: DC | PRN
  Administered 2014-07-18: 80 mL via INTRA_ARTERIAL

## 2014-07-18 MED ORDER — ASPIRIN 81 MG PO CHEW: 81.0000 mg | CHEWABLE_TABLET | ORAL | Status: DC

## 2014-07-18 MED ORDER — SODIUM CHLORIDE 0.9 % IJ SOLN: 3.0000 mL | Freq: Two times a day (BID) | INTRAMUSCULAR | Status: DC

## 2014-07-18 MED ORDER — SODIUM CHLORIDE 0.9 % WEIGHT BASED INFUSION
3.0000 mL/kg/h | INTRAVENOUS | Status: AC
  Administered 2014-07-18: 3 mL/kg/h via INTRAVENOUS

## 2014-07-18 MED ORDER — FENTANYL CITRATE (PF) 100 MCG/2ML IJ SOLN
INTRAMUSCULAR | Status: AC
  Filled 2014-07-18: qty 2

## 2014-07-18 MED ORDER — ACETAMINOPHEN 325 MG PO TABS: 650.0000 mg | ORAL_TABLET | ORAL | Status: DC | PRN

## 2014-07-18 MED ORDER — HEPARIN (PORCINE) IN NACL 2-0.9 UNIT/ML-% IJ SOLN
INTRAMUSCULAR | Status: AC
  Filled 2014-07-18: qty 1500

## 2014-07-18 MED ORDER — ONDANSETRON HCL 4 MG/2ML IJ SOLN: 4.0000 mg | Freq: Four times a day (QID) | INTRAMUSCULAR | Status: DC | PRN

## 2014-07-18 MED ORDER — LIDOCAINE HCL (PF) 1 % IJ SOLN
INTRAMUSCULAR | Status: AC
  Filled 2014-07-18: qty 30

## 2014-07-18 MED ORDER — FUROSEMIDE 10 MG/ML IJ SOLN
INTRAMUSCULAR | Status: AC
  Filled 2014-07-18: qty 4

## 2014-07-18 MED ORDER — MIDAZOLAM HCL 2 MG/2ML IJ SOLN
INTRAMUSCULAR | Status: DC | PRN
  Administered 2014-07-18: 2 mg via INTRAVENOUS

## 2014-07-18 SURGICAL SUPPLY — 14 items
CATH INFINITI 5 FR AL2 (CATHETERS) ×2 IMPLANT
CATH INFINITI 5 FR JL3.5 (CATHETERS) ×2 IMPLANT
CATH INFINITI 5FR AL1 (CATHETERS) ×2 IMPLANT
CATH INFINITI 5FR ANG PIGTAIL (CATHETERS) ×2 IMPLANT
CATH INFINITI JR4 5F (CATHETERS) ×2 IMPLANT
DEVICE RAD COMP TR BAND LRG (VASCULAR PRODUCTS) ×2 IMPLANT
GLIDESHEATH SLEND SS 6F .021 (SHEATH) ×2 IMPLANT
KIT HEART LEFT (KITS) ×2 IMPLANT
PACK CARDIAC CATHETERIZATION (CUSTOM PROCEDURE TRAY) ×2 IMPLANT
SYR MEDRAD MARK V 150ML (SYRINGE) ×2 IMPLANT
TRANSDUCER W/STOPCOCK (MISCELLANEOUS) ×2 IMPLANT
TUBING CIL FLEX 10 FLL-RA (TUBING) ×2 IMPLANT
WIRE EMERALD ST .035X260CM (WIRE) ×2 IMPLANT
WIRE SAFE-T 1.5MM-J .035X260CM (WIRE) ×2 IMPLANT

## 2014-07-18 NOTE — Interval H&P Note (Signed)
History and Physical Interval Note:  07/18/2014 8:38 AM  Kandyce Rudeuben Wrobel  has presented today for surgery, with the diagnosis of cp, abnormal myoview  The various methods of treatment have been discussed with the patient and family. After consideration of risks, benefits and other options for treatment, the patient has consented to  Procedure(s): Left Heart Cath and Coronary Angiography (N/A) as a surgical intervention .  The patient's history has been reviewed, patient examined, no change in status, stable for surgery.  I have reviewed the patient's chart and labs.  Questions were answered to the patient's satisfaction.    Cath Lab Visit (complete for each Cath Lab visit)  Clinical Evaluation Leading to the Procedure:   ACS: No.  Non-ACS:    Anginal Classification: CCS III  Anti-ischemic medical therapy: No Therapy  Non-Invasive Test Results: Intermediate-risk stress test findings: cardiac mortality 1-3%/year  Prior CABG: No previous CABG       Tonny Bollmanooper, Xzavian Semmel

## 2014-07-18 NOTE — Discharge Instructions (Signed)
Radial Site Care °Refer to this sheet in the next few weeks. These instructions provide you with information on caring for yourself after your procedure. Your caregiver may also give you more specific instructions. Your treatment has been planned according to current medical practices, but problems sometimes occur. Call your caregiver if you have any problems or questions after your procedure. °HOME CARE INSTRUCTIONS °· You may shower the day after the procedure. Remove the bandage (dressing) and gently wash the site with plain soap and water. Gently pat the site dry. °· Do not apply powder or lotion to the site. °· Do not submerge the affected site in water for 3 to 5 days. °· Inspect the site at least twice daily. °· Do not flex or bend the affected arm for 24 hours. °· No lifting over 5 pounds (2.3 kg) for 5 days after your procedure. °· Do not drive home if you are discharged the same day of the procedure. Have someone else drive you. °· You may drive 24 hours after the procedure unless otherwise instructed by your caregiver. °· Do not operate machinery or power tools for 24 hours. °· A responsible adult should be with you for the first 24 hours after you arrive home. °What to expect: °· Any bruising will usually fade within 1 to 2 weeks. °· Blood that collects in the tissue (hematoma) may be painful to the touch. It should usually decrease in size and tenderness within 1 to 2 weeks. °SEEK IMMEDIATE MEDICAL CARE IF: °· You have unusual pain at the radial site. °· You have redness, warmth, swelling, or pain at the radial site. °· You have drainage (other than a small amount of blood on the dressing). °· You have chills. °· You have a fever or persistent symptoms for more than 72 hours. °· You have a fever and your symptoms suddenly get worse. °· Your arm becomes pale, cool, tingly, or numb. °· You have heavy bleeding from the site. Hold pressure on the site. °Document Released: 03/20/2010 Document Revised:  05/10/2011 Document Reviewed: 03/20/2010 °[employer]® Patient Information ©2015 [employer]. This information is not intended to replace advice given to you by your health care provider. Make sure you discuss any questions you have with your health care provider. ° °

## 2014-07-18 NOTE — H&P (View-Only) (Signed)
Charles [name]  07/10/2014 3:00 PM  Office Visit  MRN:  [id number]   Description: Male DOB: January 25, 1941  Provider: Carlena Bjornstad, MD  Department: [facility]       Vital Signs  Most recent update: 07/10/2014 3:36 PM by Lynelle Smoke T Wysor    BP Pulse Ht Wt BMI SpO2    142/58 mmHg 58 _0  (1.88 m) 280 lb 6.4 oz (127.189 kg) 35.99 kg/m2 96%    Vitals History     Progress Notes      Carlena Bjornstad, MD at 07/11/2014 12:52 PM     Status: Sign at close encounter       Expand All Collapse All      Cardiology Office Note   Date: 07/11/2014   ID: Ellis Parents, DOB 05/11/1940, MRN [id number]  PCP: Scarlette Calico, MD Cardiologist: Dola Argyle, MD   Chief Complaint  Patient presents with  . Appointment    Follow-up atrial fibrillation and aortic stenosis and abnormal nuclear stress study  . Labs Only     History of Present Illness: [name] is a 74 y.o. male who presents to follow up atrial fibrillation and an abnormal stress nuclear study. Historically the patient had undergone cardiac catheterization elsewhere in the remote past. He tells me that he did not have significant narrowings. He has aortic stenosis. His last echo in 2014 showed a mean gradient of 25 mmHg with good LV function. It was felt that he had moderate aortic stenosis. He has atrial fibrillation. He has had 2 GI bleeds in the past and does not want to be anticoagulated.  Recently he's had some shortness of breath. He also had one episode of chest discomfort. He saw his primary physician several days later. A stress nuclear study was ordered. This was done June 24, 2014. It showed small scar at the base inferior and inferolateral walls with moderate peri-infarct ischemia. There was also mild scar in the apical inferior segment. Ejection fraction was 39%. There is description of akinesis of the base inferior and inferolateral walls. There was also suggestion of  paradoxical septal motion. As of today there has not yet been a follow-up echo. I will try to arrange this before his catheterization.  The patient I had a careful discussion about the symptoms that he had an about his test result. It is concerning for the possibility that he may of had a silent infarct.    Past Medical History  Diagnosis Date  . Morbid obesity   . Diverticulosis of colon   . Acute pancreatitis     Gallstone pancreatitis  . COPD (chronic obstructive pulmonary disease)   . OSA (obstructive sleep apnea)     Dr. Lamonte Sakai  . CAD (coronary artery disease)   . Cataract   . Nonexudative senile macular degeneration of retina   . Solitary pulmonary nodule     Dr. Lamonte Sakai Followed by serial CTs  . Hyperlipidemia   . Osteoarthritis   . Anemia   . Diabetes mellitus, type 2   . Asthma   . Hypertension   . Stroke 1996     Remote past  . Ejection fraction     EF 55-60%, echo, March, 2013, Good RV function  . Aortic stenosis     Mild, echo, March, 2013,   . Atrial fibrillation     New diagnosis August 11, 2011  . DOE (dyspnea on exertion)     Dr. Lamonte Sakai, obesity and deconditioning playing a major role.  No definite pulmonary hypertension on echo.  . Edema     Probably from venous insufficiency    Past Surgical History  Procedure Laterality Date  . Cholecystectomy    . Tonsillectomy      Patient Active Problem List   Diagnosis Date Noted  . BPH (benign prostatic hyperplasia) 07/19/2013  . Mitral regurgitation 10/12/2012  . COPD (chronic obstructive pulmonary disease)   . Hyperlipidemia with target LDL less than 100   . 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 Hematuria 11/29/2011  . Routine health maintenance 11/29/2011  . Atrial fibrillation   . Hypertension   .  Aortic stenosis   . Solitary pulmonary nodule   . OSA on CPAP 11/24/2009  . MACULAR DEGENERATION, SENILE, NONEXUDATIVE 11/24/2009  . Coronary atherosclerosis 11/24/2009  . OSTEOARTHRITIS, MODERATE 11/24/2009  . Obesity, Class III, BMI 40-49.9 (morbid obesity) 11/18/2009      Current Outpatient Prescriptions  Medication Sig Dispense Refill  . Aflibercept 2 MG/0.05ML SOLN Inject into the eye as directed. Injection into right eye every 12 weeks    . albuterol (PROVENTIL HFA;VENTOLIN HFA) 108 (90 BASE) MCG/ACT inhaler Inhale 2 puffs into the lungs every 6 (six) hours as needed for wheezing or shortness of breath.    Marland Kitchen aspirin EC 81 MG tablet Take 1 tablet (81 mg total) by mouth daily. 90 tablet 3  . atenolol (TENORMIN) 50 MG tablet Take 25 mg by mouth daily. Taking one half tablet (25 mg) by mouth daily    . B-D INS SYRINGE 0.5CC/30GX1/2" 30G X 1/2" 0.5 ML MISC USE AS DIRECTED. 200 each PRN  . Blood Glucose Monitoring Suppl (TRUETRACK BLOOD GLUCOSE) W/DEVICE KIT Test up to TID Dx: E11.39 1 each 2  . diphenoxylate-atropine (LOMOTIL) 2.5-0.025 MG per tablet Take 1 tablet by mouth 4 (four) times daily as needed for diarrhea or loose stools.     Marland Kitchen glucose blood (TRUETEST TEST) test strip Use as directed three times daily to check blood sugar. Diagnosis code E11.39. 300 each 1  . glucose blood (TRUETRACK TEST) test strip Test up to TID Dx:E11.39 100 each 12  . guaiFENesin (MUCINEX) 600 MG 12 hr tablet Take 600 mg by mouth daily.    . hydrochlorothiazide (HYDRODIURIL) 25 MG tablet Take 25 mg by mouth daily.    . Insulin Isophane & Regular Human (HUMULIN 70/30 KWIKPEN) (70-30) 100 UNIT/ML PEN Inject 30 Units into the skin 2 (two) times daily with a meal. 15 mL 11  . lisinopril (PRINIVIL,ZESTRIL) 20 MG tablet Take 20 mg by mouth daily.    . metFORMIN (GLUCOPHAGE) 1000 MG tablet Take 1,000 mg by mouth 2 (two)  times daily with a meal.    . minocycline (MINOCIN,DYNACIN) 100 MG capsule Take 100 mg by mouth 3 (three) times a week.     . nitroGLYCERIN (NITROSTAT) 0.4 MG SL tablet Place 1 tablet (0.4 mg total) under the tongue every 5 (five) minutes as needed for chest pain. 50 tablet 3  . Omega-3 Fatty Acids (FISH OIL) 1000 MG CAPS Take 1 capsule by mouth daily.     Marland Kitchen Respiratory Therapy Supplies (FLUTTER) DEVI Use as directed 1 each 0  . simvastatin (ZOCOR) 40 MG tablet Take 40 mg by mouth daily at 6 PM.     No current facility-administered medications for this visit.    Allergies: Review of patient's allergies indicates no known allergies.    Social History: The patient  reports that  he quit smoking about 24 years ago. His smoking use included Cigarettes. He has a 105 pack-year smoking history. He has never used smokeless tobacco. He reports that he does not drink alcohol or use illicit drugs.   Family History: The patient's family history includes Cancer in his father; Diabetes in his mother; Heart attack in his mother; Heart disease in his mother; Heart failure in his father; Hypertension in his mother.    ROS: Please see the history of present illness. Patient denies fever, chills, headache, sweats, rash, change in vision, change in hearing, cough, nausea or vomiting, urinary symptoms. All other systems are reviewed and are negative.    PHYSICAL EXAM: VS: BP 142/58 mmHg  Pulse 58  Ht _0  (1.88 m)  Wt 280 lb 6.4 oz (127.189 kg)  BMI 35.99 kg/m2  SpO2 96% , The patient is significantly overweight. He is oriented to person time and place. Affect is normal. Head is atraumatic. Sclera and conjunctiva are normal. There is no jugulovenous distention. Lungs are clear. Respiratory effort is not labored. Cardiac exam reveals S1 and S2. There is a crescendo decrescendo systolic murmur. Abdomen is protuberant but soft. There is trace peripheral edema area he  walks with a cane. He has some discoloration in his lower legs from chronic venous insufficiency.  EKG: I reviewed his last EKG and there is no diagnostic change.   Recent Labs: 06/05/2014: ALT 14; TSH 1.93 07/10/2014: BUN 25*; Creatinine 1.48; Hemoglobin 13.7; Platelets 208.0; Potassium 4.1; Sodium 139    Lipid Panel  Labs (Brief)       Component Value Date/Time   CHOL 136 06/05/2014 1211   TRIG 80.0 06/05/2014 1211   HDL 24.20* 06/05/2014 1211   CHOLHDL 6 06/05/2014 1211   VLDL 16.0 06/05/2014 1211   LDLCALC 96 06/05/2014 1211       Wt Readings from Last 3 Encounters:  07/10/14 280 lb 6.4 oz (127.189 kg)  07/01/14 281 lb (127.461 kg)  06/24/14 281 lb (127.461 kg)      Current medicines are reviewed The patient understands his medications.     ASSESSMENT AND PLAN:              Aortic stenosis - Carlena Bjornstad, MD at 07/11/2014 12:58 PM     Status: Written Related Problem: Aortic stenosis   Expand All Collapse All   Previously his aortic stenosis was felt to be moderate. He is scheduled for catheterization on Jul 18, 2014. I'm hopeful that we can have an echo completed by then.            Atrial fibrillation - Carlena Bjornstad, MD at 07/11/2014 12:59 PM     Status: Written Related Problem: Atrial fibrillation   Expand All Collapse All   There is chronic atrial fibrillation with good rate control. The patient has a history of 2 GI bleeds and does not want to be anticoagulated.            Coronary atherosclerosis - Carlena Bjornstad, MD at 07/11/2014 1:00 PM     Status: Edited Related Problem: Coronary atherosclerosis   Expand All Collapse All   According the patient, catheterizations in the remote past showed no major obstructive disease. He now has had an episode of chest pain and he has an abnormal nuclear scan. There is suggestion of decreased ejection fraction compared to the past and wall motion  abnormalities. There is also some peri-infarct ischemia. We need to proceed with cardiac catheterization to  assess his coronaries. We will also get hemodynamic data concerning aortic stenosis. I will try to have an echo completed by that time also to help with the assessment of his aortic stenosis.  As part of today's evaluation I spent greater than 25 minutes with his total care. More than half of this time was with direct contact with the patient talking about his nuclear study in talking about proceeding with cardiac catheterization.         Revision History       Date/Time User Action    > 07/11/2014 1:03 PM Carlena Bjornstad, MD Edit     07/11/2014 1:00 PM Carlena Bjornstad, MD Create              Hematuria - Carlena Bjornstad, MD at 07/11/2014 1:01 PM     Status: Written Related Problem: Hematuria   Expand All Collapse All   He had limited hematuria in the past while on Xarelto. urology workup was negative. Hopefully this will not be a problem if he needs dual antiplatelet therapy.            Mitral regurgitation - Carlena Bjornstad, MD at 07/11/2014 1:02 PM     Status: Written Related Problem: Mitral regurgitation   Expand All Collapse All   There is history of mild mitral regurgitation in the past. This will also be reassessed by echo and cath.             Referring Provider     Janith Lima, MD     Diagnoses     Atherosclerosis of native coronary artery of native heart with other form of angina pectoris - Primary    ICD-9-CM: 414.01, 413.9 ICD-10-CM: I25.118    Pre-procedure lab exam     ICD-9-CM: V72.63 ICD-10-CM: Z01.812    Anticoagulated     ICD-9-CM: V58.61 ICD-10-CM: Z79.01    Essential hypertension     ICD-9-CM: 401.9 ICD-10-CM: I10    Aortic stenosis     ICD-9-CM: 424.1 ICD-10-CM: I35.0    Chronic atrial fibrillation     ICD-9-CM: 427.31 ICD-10-CM: I48.2    Hematuria     ICD-9-CM:  599.70 ICD-10-CM: R31.9    Mitral regurgitation     ICD-9-CM: 424.0 ICD-10-CM: I34.0       Reason for Visit     Appointment    Follow-up atrial fibrillation and aortic stenosis and abnormal nuclear stress study    Labs Only    Reason for Visit History        Discontinued Medications       Reason for Discontinue    Aflibercept (EYLEA) 2 MG/0.05ML SOLN Entry Error    atenolol (TENORMIN) 50 MG tablet Entry Error    PROAIR HFA 108 (90 BASE) MCG/ACT inhaler Entry Error    lisinopril (PRINIVIL,ZESTRIL) 20 MG tablet Entry Error    metFORMIN (GLUCOPHAGE) 1000 MG tablet Entry Error    simvastatin (ZOCOR) 40 MG tablet Entry Error    minocycline (MINOCIN,DYNACIN) 100 MG capsule Entry Error    hydrochlorothiazide (HYDRODIURIL) 25 MG tablet Entry Error      Medication Notes     diphenoxylate-atropine (LOMOTIL) 2.5-0.025 MG per tablet     Tammy T Wysor 07/10/2014 3:30 PM Received from: External Pharmacy      minocycline (MINOCIN,DYNACIN) 100 MG capsule     Tammy T Wysor 07/10/2014 3:30 PM Received from: External Pharmacy        Level of Service     PR  OFFICE OUTPATIENT VISIT 25 MINUTES [[id number]]        AVS Reports     Date/Time Report Action User    07/10/2014 4:05 PM After Visit Summary Printed Deliah Boston Via, LPN      Patient Instructions     Medication Instructions:  Same  Labwork: Today (Bmet, CBCD and INR)  Testing/Procedures: Your physician has requested that you have a cardiac catheterization. Cardiac catheterization is used to diagnose and/or treat various heart conditions. Doctors may recommend this procedure for a number of different reasons. The most common reason is to evaluate chest pain. Chest pain can be a symptom of coronary artery disease (CAD), and cardiac catheterization can show whether plaque is narrowing or blocking your heart's arteries. This procedure is also used to evaluate the valves, as well  as measure the blood flow and oxygen levels in different parts of your heart. For further information please visit [email or web address]. Please follow instruction sheet, as given.   Follow-Up: Your physician recommends that you schedule a follow-up appointment in: will be arranged after your Cath             Routing History     There are no sent or routed communications associated with this encounter.     Previous Visit       Provider Department Encounter #    07/10/2014 1:15 PM Dola Argyle, MD [facility] [id number]     We will be ready to proceed with cardiac catheterization on Jul 18, 2014

## 2014-07-19 ENCOUNTER — Encounter (HOSPITAL_COMMUNITY): Payer: Self-pay

## 2014-07-19 MED FILL — Lidocaine HCl Local Preservative Free (PF) Inj 1%: INTRAMUSCULAR | Qty: 30 | Status: AC

## 2014-07-19 MED FILL — Heparin Sodium (Porcine) 2 Unit/ML in Sodium Chloride 0.9%: INTRAMUSCULAR | Qty: 1500 | Status: AC

## 2014-07-22 ENCOUNTER — Encounter (HOSPITAL_COMMUNITY): Payer: Self-pay

## 2014-07-23 ENCOUNTER — Telehealth: Payer: Self-pay | Admitting: Cardiology

## 2014-07-23 NOTE — Telephone Encounter (Signed)
Pt states that Larita FifeLynn had asked him to call. Pt states that Dr. Excell Seltzerooper did a heart cath on pt recently and said that he would speak with Dr. Myrtis SerKatz about pt's condition and that someone would be in contact in regards to follow up. Informed pt that Larita FifeLynn and Dr. Myrtis SerKatz are out of the office today but that I would forward this information to both of them to make them aware. Pt verbalized understanding and was in agreement with this plan.

## 2014-07-23 NOTE — Telephone Encounter (Signed)
[place]  Pt returning Lynn's phone call. Please call back and discuss.

## 2014-07-24 ENCOUNTER — Encounter (HOSPITAL_COMMUNITY): Payer: Self-pay

## 2014-07-25 NOTE — Telephone Encounter (Signed)
I left a detailed message advising the pt that I have received his message from 5/24 and that the message was also sent to Dr Myrtis SerKatz. I also stated that we will contact the pt with Dr Myrtis Serkatz recommendations, if any, once Dr Myrtis SerKatz reviews his cath report and talks with Dr Excell Seltzerooper.

## 2014-07-26 ENCOUNTER — Encounter (HOSPITAL_COMMUNITY): Admission: RE | Admit: 2014-07-26 | Payer: Self-pay | Source: Ambulatory Visit

## 2014-07-26 NOTE — Telephone Encounter (Signed)
The pt is advised and he verbalized understanding and agrees with the plan. Appointment scheduled for June 1 at 3:45.

## 2014-07-26 NOTE — Telephone Encounter (Signed)
Please see if the patient can come in for an office visit to review everything with me at 3:45 in the afternoon on June 1.

## 2014-07-29 ENCOUNTER — Encounter (HOSPITAL_COMMUNITY): Payer: Self-pay

## 2014-07-31 ENCOUNTER — Encounter: Payer: Self-pay | Admitting: Cardiology

## 2014-07-31 ENCOUNTER — Encounter (HOSPITAL_COMMUNITY): Payer: Self-pay

## 2014-07-31 ENCOUNTER — Ambulatory Visit (INDEPENDENT_AMBULATORY_CARE_PROVIDER_SITE_OTHER): Payer: Medicare Other | Admitting: Cardiology

## 2014-07-31 VITALS — BP 132/62 | HR 49 | Ht 74.0 in | Wt 281.4 lb

## 2014-07-31 DIAGNOSIS — I4819 Other persistent atrial fibrillation: Secondary | ICD-10-CM

## 2014-07-31 DIAGNOSIS — I350 Nonrheumatic aortic (valve) stenosis: Secondary | ICD-10-CM | POA: Diagnosis not present

## 2014-07-31 DIAGNOSIS — I481 Persistent atrial fibrillation: Secondary | ICD-10-CM

## 2014-07-31 DIAGNOSIS — I2510 Atherosclerotic heart disease of native coronary artery without angina pectoris: Secondary | ICD-10-CM

## 2014-07-31 NOTE — Patient Instructions (Signed)
Medication Instructions:  Same-No changes  Labwork: None  Testing/Procedures: None  Follow-Up: Your physician recommends that you schedule a follow-up appointment in: end of July   Any Other Special Instructions Will Be Listed Below (If Applicable).  Someone from Dr Barry Dieneswens office will contact you to schedule your appointment with him.

## 2014-07-31 NOTE — Progress Notes (Signed)
Cardiology Office Note   Date:  07/31/2014   ID:  Charles [name], DOB 11-18-1940, MRN [id number]  PCP:  Scarlette Calico, MD  Cardiologist:  Dola Argyle, MD   Chief Complaint  Patient presents with  . Appointment    Follow-up coronary artery disease      History of Present Illness: Charles [name] is a 74 y.o. male who presents to follow-up coronary disease and discussed the findings of his cardiac catheterization. When I saw the patient in early May, 2016, I saw him to follow-up history of atrial fibrillation and an abnormal stress nuclear study. It is of note that the patient has refused anticoagulation for his atrial fibrillation because of prior GI bleeding. He has exertional shortness of breath. He was not having significant chest pain. Two-dimensional echo revealed an ejection fraction of 55-60%. There was moderate aortic stenosis with a peak gradient of 45 mmHg and a mean gradient of 25 mmHg. I decided to proceed with cardiac catheterization that was done by Dr. Burt Knack. The patient has a long 90% stenosis of the LAD. This is not felt to be a good lesion for PCI. His moderate aortic stenosis was reconfirmed. There is also moderate diffuse stenosis of the mid RCA . LVEDP was increased that day. We had held his diuretics for mild renal insufficiency just prior to the catheterization.  Dr. Burt Knack and I discussed the results. We felt that if a procedure is to be done surgery for his coronaries and aortic valve should be considered. Consideration could be given to PCI of the LAD and FFR of the RCA with continued observation of his aortic stenosis. However he is not a good candidate for long-term dual antiplatelets therapy with his GI bleeding history.  Patient returns to the office today knee stable. Cath site in the right radial is stable.   Past Medical History  Diagnosis Date  . Morbid obesity   . Diverticulosis of colon   . Acute pancreatitis     Gallstone pancreatitis  .  COPD (chronic obstructive pulmonary disease)   . OSA (obstructive sleep apnea)     Dr. Lamonte Sakai  . CAD (coronary artery disease)   . Cataract   . Nonexudative senile macular degeneration of retina   . Solitary pulmonary nodule     Dr. Lamonte Sakai  Followed by serial CTs  . Hyperlipidemia   . Osteoarthritis   . Anemia   . Diabetes mellitus, type 2   . Asthma   . Hypertension   . Stroke   1996     Remote past  . Ejection fraction     EF 55-60%, echo, March, 2013, Good RV function  . Aortic stenosis     Mild, echo, March, 2013,   . Atrial fibrillation     New diagnosis August 11, 2011  . DOE (dyspnea on exertion)     Dr. Lamonte Sakai,  obesity and deconditioning  playing a major role. No definite pulmonary hypertension on echo.  . Edema     Probably from venous insufficiency    Past Surgical History  Procedure Laterality Date  . Cholecystectomy    . Tonsillectomy    . Cardiac catheterization N/A 07/18/2014    Procedure: Left Heart Cath and Coronary Angiography;  Surgeon: Sherren Mocha, MD;  Location: [facility];  Service: Cardiovascular;  Laterality: N/A;    Patient Active Problem List   Diagnosis Date Noted  . Ejection fraction   . BPH (benign prostatic hyperplasia) 07/19/2013  . Mitral  regurgitation 10/12/2012  . COPD (chronic obstructive pulmonary disease)   . Hyperlipidemia with target LDL less than 100   . 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 Hematuria 11/29/2011  . Routine health maintenance 11/29/2011  . Atrial fibrillation   . Hypertension   . Aortic stenosis   . Solitary pulmonary nodule   . OSA on CPAP 11/24/2009  . MACULAR DEGENERATION, SENILE, NONEXUDATIVE 11/24/2009  . Coronary atherosclerosis 11/24/2009  . OSTEOARTHRITIS, MODERATE 11/24/2009  . Obesity, Class III, BMI 40-49.9 (morbid obesity) 11/18/2009      Current Outpatient Prescriptions  Medication Sig Dispense Refill  . Aflibercept 2  MG/0.05ML SOLN Inject into the eye as directed. Injection into right eye every 4 weeks    . albuterol (PROVENTIL HFA;VENTOLIN HFA) 108 (90 BASE) MCG/ACT inhaler Inhale 2 puffs into the lungs every 6 (six) hours as needed for wheezing or shortness of breath.    Marland Kitchen aspirin EC 81 MG tablet Take 1 tablet (81 mg total) by mouth daily. 90 tablet 3  . atenolol (TENORMIN) 50 MG tablet Take 25 mg by mouth daily. Taking one half tablet (25 mg) by mouth daily    . B-D INS SYRINGE 0.5CC/30GX1/2" 30G X 1/2" 0.5 ML MISC USE AS DIRECTED. 200 each PRN  . Blood Glucose Monitoring Suppl (TRUETRACK BLOOD GLUCOSE) W/DEVICE KIT Test up to TID Dx: E11.39 1 each 2  . glucose blood (TRUETEST TEST) test strip Use as directed three times daily to check blood sugar.  Diagnosis code E11.39. 300 each 1  . glucose blood (TRUETRACK TEST) test strip Test up to TID Dx:E11.39 100 each 12  . hydrochlorothiazide (HYDRODIURIL) 25 MG tablet Take 25 mg by mouth daily.    . Insulin Isophane & Regular Human (HUMULIN 70/30 KWIKPEN) (70-30) 100 UNIT/ML PEN Inject 30 Units into the skin 2 (two) times daily with a meal. 15 mL 11  . lisinopril (PRINIVIL,ZESTRIL) 20 MG tablet Take 20 mg by mouth daily.    . metFORMIN (GLUCOPHAGE) 1000 MG tablet Take 1,000 mg by mouth 2 (two) times daily with a meal.    . minocycline (MINOCIN,DYNACIN) 100 MG capsule Take 100 mg by mouth 3 (three) times a week.     . nitroGLYCERIN (NITROSTAT) 0.4 MG SL tablet Place 1 tablet (0.4 mg total) under the tongue every 5 (five) minutes as needed for chest pain. 50 tablet 3  . Omega-3 Fatty Acids (FISH OIL) 1000 MG CAPS Take 1 capsule by mouth 2 (two) times daily.     Marland Kitchen Respiratory Therapy Supplies (FLUTTER) DEVI Use as directed 1 each 0  . simvastatin (ZOCOR) 40 MG tablet Take 40 mg by mouth daily at 6 PM.     No current facility-administered medications for this visit.    Allergies:   Review of patient's allergies indicates no known allergies.    Social History:   The patient  reports that he quit smoking about 24 years ago. His smoking use included Cigarettes. He has a 105 pack-year smoking history. He has never used smokeless tobacco. He reports that he does not drink alcohol or use illicit drugs.   Family History:  The patient's family history includes Cancer in his father; Diabetes in his mother; Heart attack in his mother; Heart disease in his mother; Heart failure in his father; Hypertension in his mother.    ROS:  Please see the history of present illness.   Patient denies fever, chills, headache, sweats, rash, change  in vision, change in hearing, chest pain, cough, nausea or vomiting, urinary symptoms. All other systems are reviewed and are negative.    PHYSICAL EXAM: VS:  BP 132/62 mmHg  Pulse 49  Ht 6' 2" (1.88 m)  Wt 281 lb 6.4 oz (127.642 kg)  BMI 36.11 kg/m2 , Patient is overweight. He is oriented to person time and place. Affect is normal. Head is atraumatic. Sclera and conjunctiva are normal. There is no jugular venous distention. Lungs are clear. Respiratory effort is not labored. Cardiac exam reveals S1 and S2. There is a crescendo decrescendo systolic murmur. The abdomen is soft. There is trace peripheral edema. There is some skin discoloration in the lower extremities. There are no musculoskeletal deformities.  EKG:   EKG is not done today.   Recent Labs: 06/05/2014: ALT 14; TSH 1.93 07/10/2014: Hemoglobin 13.7; Platelets 208.0 07/15/2014: BUN 21; Creatinine 1.28; Potassium 4.4; Sodium 138    Lipid Panel    Component Value Date/Time   CHOL 136 06/05/2014 1211   TRIG 80.0 06/05/2014 1211   HDL 24.20* 06/05/2014 1211   CHOLHDL 6 06/05/2014 1211   VLDL 16.0 06/05/2014 1211   LDLCALC 96 06/05/2014 1211      Wt Readings from Last 3 Encounters:  07/31/14 281 lb 6.4 oz (127.642 kg)  07/18/14 280 lb (127.007 kg)  07/10/14 280 lb 6.4 oz (127.189 kg)      Current medicines are reviewed  The patient understands his  medications.   ASSESSMENT AND PLAN:

## 2014-07-31 NOTE — Assessment & Plan Note (Signed)
Catheterization reveals significant coronary disease. Options would include PCI to the LAD/FFR of the RCA or CABG in combination with aortic valve replacement. The patient may not be a good candidate for PCI as he would need dual antiplatelets therapy. He's had GI bleeding in the past and refuses Coumadin for his atrial fibrillation.

## 2014-07-31 NOTE — Assessment & Plan Note (Signed)
Atrial fibrillation was diagnosed in June, 2013. Because of bleeding in the past on 2 occasions, the patient does not want to be anticoagulated. His atrial fib rate is controlled. If he were to have CABG and aVR, a maze procedure could also be done area

## 2014-07-31 NOTE — Assessment & Plan Note (Signed)
Aortic stenosis is moderate by cath and echo. As a stand-alone problem, the aortic stenosis is not severe enough to recommend aortic valve replacement. However, consideration can be given to aortic valve replacement along with bypass surgery and possibly a maze procedure. Presumably a tissue aortic valve replacement would be used. If this approach were followed, we might be able to treat all of his problems and keep away from the need for dual antiplatelets therapy. Of course a maze procedure would still not take away the recommendation for Coumadin, but in his case it may be the best we can do as he refuses Coumadin.         I will be referring him to cardiac surgery for an opinion concerning all of the issues that I have raised above.     As part of today's evaluation I spent greater than 25 minutes with his total care. More than half of this time is been with direct contact with the patient talking about all of his options.

## 2014-08-02 ENCOUNTER — Encounter (HOSPITAL_COMMUNITY): Payer: Self-pay

## 2014-08-02 ENCOUNTER — Encounter: Payer: Self-pay | Admitting: Thoracic Surgery (Cardiothoracic Vascular Surgery)

## 2014-08-02 ENCOUNTER — Other Ambulatory Visit: Payer: Self-pay | Admitting: *Deleted

## 2014-08-02 ENCOUNTER — Institutional Professional Consult (permissible substitution) (INDEPENDENT_AMBULATORY_CARE_PROVIDER_SITE_OTHER): Payer: Medicare Other | Admitting: Thoracic Surgery (Cardiothoracic Vascular Surgery)

## 2014-08-02 VITALS — BP 142/56 | HR 72 | Resp 20 | Ht 74.0 in | Wt 285.0 lb

## 2014-08-02 DIAGNOSIS — I482 Chronic atrial fibrillation, unspecified: Secondary | ICD-10-CM

## 2014-08-02 DIAGNOSIS — I350 Nonrheumatic aortic (valve) stenosis: Secondary | ICD-10-CM

## 2014-08-02 DIAGNOSIS — Z7409 Other reduced mobility: Secondary | ICD-10-CM | POA: Insufficient documentation

## 2014-08-02 DIAGNOSIS — I25110 Atherosclerotic heart disease of native coronary artery with unstable angina pectoris: Secondary | ICD-10-CM | POA: Diagnosis not present

## 2014-08-02 DIAGNOSIS — I5042 Chronic combined systolic (congestive) and diastolic (congestive) heart failure: Secondary | ICD-10-CM | POA: Insufficient documentation

## 2014-08-02 DIAGNOSIS — R5381 Other malaise: Secondary | ICD-10-CM | POA: Insufficient documentation

## 2014-08-02 DIAGNOSIS — H547 Unspecified visual loss: Secondary | ICD-10-CM | POA: Insufficient documentation

## 2014-08-02 NOTE — Progress Notes (Signed)
[address]       [place] [address]             [facility] REPORT  Referring Provider is Carlena Bjornstad, MD PCP is Scarlette Calico, MD  Chief Complaint  Patient presents with  . Aortic Stenosis    Surgical eval, Cardiac Cath 07/18/14, ECHO 07/15/14    HPI:  Patient is a 74 year old morbidly obese white male with history of aortic stenosis, coronary artery disease, chronic combined systolic and diastolic congestive heart failure, chronic persistent atrial fibrillation, previous stroke, COPD, and obstructive sleep apnea who has been referred for surgical consultation to discuss treatment options for management of aortic stenosis and multivessel coronary artery disease. The patient's history dates back to 1997 when he suffered an acute hemispheric stroke. At the time the patient lived in [place] where he was hospitalized for 2 weeks and subsequently followed for several years. He moved to [place] approximately 5 years ago and has been followed chronically by Dr. Ron Parker, Dr. Lamonte Sakai, and Dr. Ronnald Ramp with multiple medical problems. The patient describes a more than 15 year history of progressive symptoms of exertional shortness of breath consistent with chronic combined systolic and diastolic congestive heart failure that is further exacerbated by the presence of underlying chronic lung disease, morbid obesity, and physical deconditioning. The patient was diagnosed with atrial fibrillation in 2012. He was initially anticoagulated using Xarelto but this was discontinued following an episode of gross hematuria. Transthoracic echocardiograms have demonstrated a gradual progression of aortic stenosis and mild left ventricular systolic dysfunction.  Echocardiogram performed July 2014 revealed findings consistent with moderate aortic stenosis and ejection fraction estimated 55-60%. Peak velocity across the aortic valve was measured 3.1 m/s  at that time, corresponding to a mean transvalvular gradient of 25 mmHg.  For a while the patient participated in the outpatient pulmonary rehabilitation program in the joint and some degree of symptomatic improvement.  He's had some progression of symptoms of exertional shortness of breath, and 3 months ago he experienced a prolonged episode of chest pain occurring at rest. The patient states that he had tightness across his chest that prolonged for several hours. He ultimately took aspirin and his symptoms resolved. He was seen in follow-up by his primary care physician and a nuclear stress test was ordered. This revealed small scarring at the base of the inferior and inferolateral wall with moderate peri-infarct ischemia and resting ejection fraction estimated 39%. The patient was referred back to Dr. Ron Parker for follow-up.  Transthoracic echocardiogram revealed ejection fraction estimated 55% with peak velocity across the aortic valve approximately 3.3 m/s corresponding to peak and mean transvalvular gradients of 43 and 21 mmHg, respectively.  Left heart catheterization revealed severe three-vessel coronary artery disease with moderate aortic stenosis and elevated left ventricular filling pressures. Mean transvalvular gradient was reported 23.4 mmHg.  Ejection fraction was estimated 45-50%.  Right heart catheterization was not performed.  Cardiothoracic surgical consultation was requested.  Patient is single, has never been married, has no children, and has no close remaining family members. He lives alone in [place]. He has been retired for many years having previously worked in [place], and in [place]. He lives a very sedentary lifestyle. He plays guitar and enjoys using him radio. He is severely limited by poor eyesight and stop driving an automobile last October. His mobility is limited primarily because of poor eye sight with considerable  instability of gait. He uses a cane to  help him walk. He quit the pulmonary rehabilitation program several months ago. He describes a 15 year history of progressive exertional shortness of breath.  He gets short of breath with very mild activity in this substantially limits his physical mobility and daily activities.  He denies any history of resting shortness of breath, PND, orthopnea, dizzy spells, or syncope. He has had some chronic lower extremity edema. He had a single prolonged episode of chest pain proximally 3 months ago. He has not had any chest discomfort recently.  He was initially diagnosed with atrial fibrillation in 2012. He has refused long-term anticoagulation therapy because of concerns for bleeding. He reports a long-standing history of bleeding diathesis.  Past Medical History  Diagnosis Date  . Morbid obesity   . Diverticulosis of colon   . Acute pancreatitis     Gallstone pancreatitis  . COPD (chronic obstructive pulmonary disease)     Dr Lamonte Sakai  . OSA (obstructive sleep apnea)     Dr. Lamonte Sakai  . CAD (coronary artery disease)   . Cataract   . Nonexudative senile macular degeneration of retina   . Solitary pulmonary nodule     Dr. Lamonte Sakai    . Hyperlipidemia   . Osteoarthritis   . Anemia   . Diabetes mellitus, type 2   . Asthma   . Hypertension   . Stroke 1997  . Aortic stenosis   . Atrial fibrillation     New diagnosis August 11, 2011  . DOE (dyspnea on exertion)   . Edema   . Chronic combined systolic and diastolic congestive heart failure   . Visual impairment     severe  . Physical deconditioning   . Limited mobility     Past Surgical History  Procedure Laterality Date  . Cholecystectomy    . Tonsillectomy    . Cardiac catheterization N/A 07/18/2014    Procedure: Left Heart Cath and Coronary Angiography;  Surgeon: Sherren Mocha, MD;  Location: [facility];  Service: Cardiovascular;  Laterality: N/A;    Family History  Problem Relation Age of Onset  . Cancer Father     metastatic  .  Heart failure Father   . Heart disease Mother   . Heart attack Mother   . Hypertension Mother   . Diabetes Mother     History   Social History  . Marital Status: Single    Spouse Name: N/A  . Number of Children: N/A  . Years of Education: N/A   Occupational History  . retired     Curator. professional musician for 12 years.    Social History Main Topics  . Smoking status: Former Smoker -- 3.00 packs/day for 35 years    Types: Cigarettes    Quit date: 03/01/1990  . Smokeless tobacco: Never Used  . Alcohol Use: No  . Drug Use: No  . Sexual Activity: Not Currently   Other Topics Concern  . 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     Current Outpatient Prescriptions  Medication Sig Dispense Refill  . Aflibercept 2 MG/0.05ML SOLN Inject into the eye as directed. Injection into right eye every 4 weeks    . albuterol (PROVENTIL HFA;VENTOLIN HFA) 108 (90 BASE) MCG/ACT inhaler Inhale 2 puffs into the lungs every 6 (six) hours as  needed for wheezing or shortness of breath.    Marland Kitchen aspirin EC 81 MG tablet Take 1 tablet (81 mg total) by mouth daily. 90 tablet 3  . atenolol (TENORMIN) 50 MG tablet Take 25 mg by mouth daily. Taking one half tablet (25 mg) by mouth daily    . B-D INS SYRINGE 0.5CC/30GX1/2" 30G X 1/2" 0.5 ML MISC USE AS DIRECTED. 200 each PRN  . Blood Glucose Monitoring Suppl (TRUETRACK BLOOD GLUCOSE) W/DEVICE KIT Test up to TID Dx: E11.39 1 each 2  . glucose blood (TRUETEST TEST) test strip Use as directed three times daily to check blood sugar.  Diagnosis code E11.39. 300 each 1  . glucose blood (TRUETRACK TEST) test strip Test up to TID Dx:E11.39 100 each 12  . hydrochlorothiazide (HYDRODIURIL) 25 MG tablet Take 25 mg by mouth daily.    . Insulin Isophane & Regular Human (HUMULIN 70/30 KWIKPEN) (70-30) 100  UNIT/ML PEN Inject 30 Units into the skin 2 (two) times daily with a meal. 15 mL 11  . lisinopril (PRINIVIL,ZESTRIL) 20 MG tablet Take 20 mg by mouth daily.    . metFORMIN (GLUCOPHAGE) 1000 MG tablet Take 1,000 mg by mouth 2 (two) times daily with a meal.    . minocycline (MINOCIN,DYNACIN) 100 MG capsule Take 100 mg by mouth 3 (three) times a week.     . nitroGLYCERIN (NITROSTAT) 0.4 MG SL tablet Place 1 tablet (0.4 mg total) under the tongue every 5 (five) minutes as needed for chest pain. 50 tablet 3  . Omega-3 Fatty Acids (FISH OIL) 1000 MG CAPS Take 1 capsule by mouth 2 (two) times daily.     Marland Kitchen Respiratory Therapy Supplies (FLUTTER) DEVI Use as directed 1 each 0  . simvastatin (ZOCOR) 40 MG tablet Take 40 mg by mouth daily at 6 PM.     No current facility-administered medications for this visit.    No Known Allergies    Review of Systems:   General:  normal appetite, normal energy, no weight gain, no weight loss, no fever  Cardiac:  no chest pain with exertion, one episode chest pain at rest, + SOB with mild exertion, no resting SOB, no PND, no orthopnea, no palpitations, + arrhythmia, + atrial fibrillation, + LE edema, no dizzy spells, no syncope  Respiratory:  + shortness of breath, no home oxygen, intermittent productive cough, no dry cough, no bronchitis, no wheezing, no hemoptysis, no asthma, no pain with inspiration or cough, + sleep apnea, + CPAP at night  GI:   no difficulty swallowing, no reflux, no frequent heartburn, no hiatal hernia, no abdominal pain, occasional constipation, occasional diarrhea, no hematochezia, no hematemesis, no melena  GU:   no dysuria,  no frequency, no urinary tract infection, no hematuria, no enlarged prostate, no kidney stones, no kidney disease  Vascular:  no pain suggestive of claudication, no pain in feet, no leg cramps, no varicose veins, no DVT, no non-healing foot ulcer  Neuro:   + stroke, no TIA's, no seizures, no headaches, no temporary  blindness one eye,  no slurred speech, + peripheral neuropathy, no chronic pain, + instability of gait, no memory/cognitive dysfunction  Musculoskeletal: + arthritis, no joint swelling, no myalgias, + difficulty walking, diminished mobility   Skin:   no rash, no itching, no skin infections, no pressure sores or ulcerations  Psych:   no anxiety, no depression, no nervousness, no unusual recent stress  Eyes:   + blurry vision, no floaters, no recent vision changes, +  wears glasses or contacts  ENT:   no hearing loss, no loose or painful teeth, partial dentures, last saw dentist April 2016  Hematologic:  + easy bruising, no abnormal bleeding, no clotting disorder, no frequent epistaxis  Endocrine:  + diabetes, checks CBG's at home     Physical Exam:   BP 142/56 mmHg  Pulse 72  Resp 20  Ht 6' 2" (1.88 m)  Wt 285 lb (129.275 kg)  BMI 36.58 kg/m2  SpO2 94%  General:  Obese but o/w  well-appearing  HEENT:  Unremarkable   Neck:   no JVD, no bruits, no adenopathy   Chest:   clear to auscultation, symmetrical breath sounds, no wheezes, no rhonchi   CV:   RRR, grade III/VI crescendo/decrecendo systolic murmur best LLSB  Abdomen:  soft, non-tender, no masses   Extremities:  warm, well-perfused, pulses not palpable at ankle, + LE edema  Rectal/GU  Deferred  Neuro:   Grossly non-focal and symmetrical throughout  Skin:   Clean and dry, no rashes, no breakdown   Diagnostic Tests:  Transthoracic Echocardiography  (Report amended )  Patient:  Charles [name], Charles [name] MR #:    [id number] Study Date: 09/12/2012 Gender:   M Age:    15 Height:   188cm Weight:   136.1kg BSA:    2.51m2 Pt. Status: Room:  ATTENDING  NRosezetta SchlatterORDERING   Norins, MCharolett BumpersREFERRING  Norins, MCharolett BumpersSONOGRAPHER NCindy Hazy RDCS PERFORMING  [facility] cc:  ------------------------------------------------------------ LV EF: 55% -   60%  ------------------------------------------------------------ Indications:   Atrial fibrillation - 427.31.  ------------------------------------------------------------ History:  PMH: Acquired from the patient and from the patient's chart. PMH: Atrial Fibrillation. Aortic Stenosis. CAD. Edema. Anemia. Dyspnea. Obstructive Sleep Apnea. COPD. Risk factors: Hypertension. Diabetes mellitus.  ------------------------------------------------------------ Study Conclusions  - Left ventricle: The cavity size was normal. Wall thickness was increased in a pattern of mild LVH. Systolic function was normal. The estimated ejection fraction was in the range of 55% to 60%. - Aortic valve: There was moderate stenosis. Mild regurgitation. Mean gradient: 26mHg (S). Peak gradient: 4027mg (S). - Mitral valve: Mild regurgitation. - Left atrium: The atrium was moderately dilated. - Atrial septum: No defect or patent foramen ovale was identified. - Pulmonary arteries: PA peak pressure: 40m56m (S). Transthoracic echocardiography. M-mode, complete 2D, spectral Doppler, and color Doppler. Height: Height: 188cm. Height: 74in. Weight: Weight: 136.1kg. Weight: 299.4lb. Body mass index: BMI: 38.5kg/m^2. Body surface area:  BSA: 2.58m^61mlood pressure:   116/50. Patient status: Outpatient. Location: [facility]  ------------------------------------------------------------  ------------------------------------------------------------ Left ventricle: The cavity size was normal. Wall thickness was increased in a pattern of mild LVH. Systolic function was normal. The estimated ejection fraction was in the range of 55% to 60%.  ------------------------------------------------------------ Aortic valve:  Doppler:  There was moderate stenosis. Mild regurgitation.  VTI ratio of LVOT to aortic valve: 0.28. Valve area: 1.28cm^2(VTI). Indexed valve  area: 0.5cm^2/m^2 (VTI). Peak velocity ratio of LVOT to aortic valve: 0.31. Valve area: 1.41cm^2 (Vmax). Indexed valve area: 0.55cm^2/m^2 (Vmax).  Mean gradient: 25mm 61mS). Peak gradient: 40mm H35m).  ------------------------------------------------------------ Mitral valve:  Doppler:  Mild regurgitation.  Peak gradient: 7mm Hg 30m.  ------------------------------------------------------------ Left atrium: The atrium was moderately dilated.  ------------------------------------------------------------ Atrial septum: No defect or patent foramen ovale was identified.  ------------------------------------------------------------ Right ventricle: The cavity size was normal. Wall thickness was normal. Systolic function was normal.  ------------------------------------------------------------ Pulmonic valve:  Doppler:  Mild  regurgitation.  ------------------------------------------------------------ Tricuspid valve:  Doppler:  Mild regurgitation.  ------------------------------------------------------------ Right atrium: The atrium was normal in size.  ------------------------------------------------------------ Pericardium: The pericardium was normal in appearance.  ------------------------------------------------------------  2D measurements    Normal Doppler measurements  Normal Left ventricle         Main pulmonary LVID ED,  50.1 mm   43-52  artery chord,             Pressure,  36 mm Hg =30 PLAX              S LVID ES,   33 mm   23-38  Left ventricle chord,             Ea, lat   13 cm/s  ------ PLAX              ann, tiss FS, chord,  34 %   >29   DP PLAX              E/Ea, lat 9.85    ------ LVPW, ED  12.2 mm   ------ ann, tiss IVS/LVPW  0.98    <1.3  DP ratio, ED           Ea, med  9.98 cm/s  ------ Ventricular septum        ann, tiss IVS, ED  11.9 mm   ------ DP LVOT              E/Ea, med 12.8    ------ Diam, S   24 mm   ------ ann, tiss   3 Area    4.52 cm^2  ------ DP Diam     24 mm   ------ LVOT Aorta             Peak vel, 98.5 cm/s  ------ Root diam,  33 mm   ------ S ED               VTI, S   23.9 cm   ------ Left atrium          Stroke vol 108. ml   ------ AP dim    46 mm   ------        1 AP dim   1.78 cm/m^2 <2.2  Stroke   41.9 ml/m^2 ------ index             index                Aortic valve                Peak vel,  316 cm/s  ------                S                Mean vel,  235 cm/s  ------                S                VTI, S   84.5 cm   ------                Mean     25 mm Hg ------                gradient,                S                Peak  40 mm Hg ------                gradient,                S                VTI ratio 0.28    ------                LVOT/AV                Area, VTI 1.28 cm^2  ------                Area index 0.5 cm^2/m ------                (VTI)      ^2                Peak vel  0.31    ------                ratio,                LVOT/AV                Area, Vmax 1.41 cm^2  ------                Area index 0.55 cm^2/m ------                (Vmax)     ^2                Regurg PHT 561 ms   ------                Mitral valve                Peak E vel 128 cm/s  ------                 Decelerati 176 ms   150-23                on time        0                Peak     7 mm Hg ------                gradient,                D                Max regurg 464 cm/s  ------                vel                Tricuspid valve                Regurg   278 cm/s  ------                peak vel                Peak RV-RA  31 mm Hg ------                gradient,                S                Systemic veins                Estimated  5 mm Hg ------                CVP                Right ventricle                Pressure,  36 mm Hg <30                S                Sa vel,  15.3 cm/s  ------                lat ann,                tiss DP  ------------------------------------------------------------ Mortimer Fries, Peter 2014-07-15T16:[id number]    CARDIAC CATHETERIZATION Conclusion    CONCLUSIONS:  SEVERE STENOSIS OF THE MID-LAD  MODERATE DIFFUSE STENOSIS OF THE MID-RCA  MILD-MODERATE STENOSIS OF THE OSTIUM OF THE LCX  MILD DISTAL LEFT MAIN STENOSIS  MODERATE AORTIC STENOSIS  ELEVATED LVEDP        Impression:  Patient has severe three-vessel coronary artery disease with moderate aortic stenosis. I have personally reviewed the patient's recent transthoracic echocardiogram and diagnostic cardiac catheterization. Echocardiogram confirms the presence of severe thickening and restricted leaflet mobility involving all 3 leaflets of the aortic valve. Peak velocity across aortic valve measures approximately 3.2 m/s corresponding to mean transvalvular gradient of 25 mmHg.  Left  ventricular systolic function appears to be at least mild to moderately reduced. Cardiac catheterization reveals the presence of severe three-vessel coronary artery disease with coronary anatomy relatively unfavorable for percutaneous coronary intervention in this patient with long-standing diabetes mellitus.  I suspect this gentleman would best be treated with a combination of surgical revascularization and aortic valve replacement, but there is no question that risks associated with conventional surgery will be very high.  I am most concerned by the patient's risks for the development of chronic respiratory failure, and formal pulmonary function testing might be helpful to better characterize his operative risks. Concomitant Maze procedure could be considered if a decision is made to proceed with high risk aortic valve replacement and coronary artery bypass grafting.   Plan:  We will obtain formal pulmonary function tests with an arterial blood gas on room air. The patient will also undergo a formal physical therapy evaluation to characterize his baseline mobility limitations and potentially assist with anticipation of needs if the patient ultimately undergoes high risk surgery. Because he lives alone and has no close family members nearby, he will unquestionably require at least temporary placement in some type of skilled nursing or rehabilitation facility during his convalescence. Finally, we will obtain a CT angiogram of the chest to rule out the unlikely possibility that he might have coexistent pulmonary embolus. This will also allow [place] the ability to formally evaluate the ascending thoracic aorta for signs of aneurysmal enlargement and/or calcification. The patient will return in 2 weeks to review the results of these tests and discussed treatment options further.   I spent in excess of 90 minutes during the conduct of this office consultation and >50% of this time involved direct face-to-face  encounter with the patient for counseling and/or coordination of their care.   Valentina Gu. Roxy Manns, MD 08/02/2014 4:03 PM

## 2014-08-05 ENCOUNTER — Encounter (HOSPITAL_COMMUNITY): Payer: Self-pay

## 2014-08-07 ENCOUNTER — Encounter (HOSPITAL_COMMUNITY): Payer: Self-pay

## 2014-08-09 ENCOUNTER — Encounter (HOSPITAL_COMMUNITY): Payer: Self-pay

## 2014-08-12 ENCOUNTER — Encounter (HOSPITAL_COMMUNITY): Payer: Self-pay

## 2014-08-12 ENCOUNTER — Ambulatory Visit (HOSPITAL_COMMUNITY)
Admission: RE | Admit: 2014-08-12 | Discharge: 2014-08-12 | Disposition: A | Discharge status: Home / Self Care | Payer: Medicare Other | Source: Ambulatory Visit | Attending: Thoracic Surgery (Cardiothoracic Vascular Surgery) | Admitting: Thoracic Surgery (Cardiothoracic Vascular Surgery)

## 2014-08-12 DIAGNOSIS — R911 Solitary pulmonary nodule: Secondary | ICD-10-CM

## 2014-08-12 DIAGNOSIS — I2510 Atherosclerotic heart disease of native coronary artery without angina pectoris: Secondary | ICD-10-CM | POA: Diagnosis not present

## 2014-08-12 DIAGNOSIS — I350 Nonrheumatic aortic (valve) stenosis: Secondary | ICD-10-CM

## 2014-08-12 DIAGNOSIS — I700 Atherosclerosis of aorta: Secondary | ICD-10-CM | POA: Insufficient documentation

## 2014-08-12 DIAGNOSIS — R918 Other nonspecific abnormal finding of lung field: Secondary | ICD-10-CM | POA: Diagnosis not present

## 2014-08-12 DIAGNOSIS — R0602 Shortness of breath: Secondary | ICD-10-CM | POA: Diagnosis not present

## 2014-08-12 HISTORY — DX: Solitary pulmonary nodule: R91.1

## 2014-08-12 LAB — PULMONARY FUNCTION TEST
DL/VA % pred: 79 %
DL/VA: 3.8 ml/min/mmHg/L
DLCO cor % pred: 43 %
DLCO cor: 15.98 ml/min/mmHg
DLCO unc % pred: 43 %
DLCO unc: 15.75 ml/min/mmHg
FEF 25-75 Post: 1.27 L/s
FEF 25-75 Pre: 0.71 L/s
FEF2575-%Change-Post: 78 %
FEF2575-%Pred-Post: 51 %
FEF2575-%Pred-Pre: 28 %
FEV1-%Change-Post: 15 %
FEV1-%Pred-Post: 57 %
FEV1-%Pred-Pre: 50 %
FEV1-Post: 1.99 L
FEV1-Pre: 1.72 L
FEV1FVC-%Change-Post: 6 %
FEV1FVC-%Pred-Pre: 84 %
FEV6-%Change-Post: 12 %
FEV6-%Pred-Post: 66 %
FEV6-%Pred-Pre: 58 %
FEV6-Post: 2.95 L
FEV6-Pre: 2.62 L
FEV6FVC-%Change-Post: 3 %
FEV6FVC-%Pred-Post: 101 %
FEV6FVC-%Pred-Pre: 98 %
FVC-%Change-Post: 8 %
FVC-%Pred-Post: 65 %
FVC-%Pred-Pre: 59 %
FVC-Post: 3.09 L
FVC-Pre: 2.83 L
Post FEV1/FVC ratio: 64 %
Post FEV6/FVC ratio: 96 %
Pre FEV1/FVC ratio: 61 %
Pre FEV6/FVC Ratio: 93 %
RV % pred: 113 %
RV: 3.1 L
TLC % pred: 75 %
TLC: 5.77 L

## 2014-08-12 LAB — BLOOD GAS, ARTERIAL
ACID-BASE DEFICIT: 0.3 mmol/L (ref 0.0–2.0)
Bicarbonate: 24 mEq/L (ref 20.0–24.0)
Drawn by: 242311
FIO2: 0.21 %
O2 SAT: 92.5 %
Patient temperature: 98.6
TCO2: 25.2 mmol/L (ref 0–100)
pCO2 arterial: 39.9 mmHg (ref 35.0–45.0)
pH, Arterial: 7.396 (ref 7.350–7.450)
pO2, Arterial: 69.1 mmHg — ABNORMAL LOW (ref 80.0–100.0)

## 2014-08-12 MED ORDER — IOHEXOL 350 MG/ML SOLN
100.0000 mL | Freq: Once | INTRAVENOUS | Status: AC | PRN
  Administered 2014-08-12: 80 mL via INTRAVENOUS

## 2014-08-12 MED ORDER — ALBUTEROL SULFATE (2.5 MG/3ML) 0.083% IN NEBU
2.5000 mg | INHALATION_SOLUTION | Freq: Once | RESPIRATORY_TRACT | Status: AC
  Administered 2014-08-12: 2.5 mg via RESPIRATORY_TRACT

## 2014-08-14 ENCOUNTER — Ambulatory Visit: Payer: Medicare Other | Attending: Thoracic Surgery (Cardiothoracic Vascular Surgery) | Admitting: Physical Therapy

## 2014-08-14 ENCOUNTER — Encounter (HOSPITAL_COMMUNITY): Payer: Self-pay

## 2014-08-14 ENCOUNTER — Encounter: Payer: Self-pay | Admitting: Physical Therapy

## 2014-08-14 DIAGNOSIS — R262 Difficulty in walking, not elsewhere classified: Secondary | ICD-10-CM | POA: Insufficient documentation

## 2014-08-14 DIAGNOSIS — I350 Nonrheumatic aortic (valve) stenosis: Secondary | ICD-10-CM | POA: Insufficient documentation

## 2014-08-15 NOTE — Therapy (Signed)
[facility] Outpatient Rehabilitation [facility] [address] [place], [place], [address] Phone: [phone]   Fax:  [phone]  Physical Therapy Evaluation  Patient Details  Name: Charles [name] MRN: [id number] Date of Birth: 1940/10/05 Referring Provider:  [name], MD  Encounter Date: 08/14/2014      PT End of Session - 08/14/14 1228    Visit Number 1   PT Start Time 1225   PT Stop Time 1317   PT Time Calculation (min) 52 min      Past Medical History  Diagnosis Date  . Morbid obesity   . Diverticulosis of colon   . Acute pancreatitis     Gallstone pancreatitis  . COPD (chronic obstructive pulmonary disease)     Dr Delton Coombes  . OSA (obstructive sleep apnea)     Dr. Delton Coombes  . CAD (coronary artery disease)   . Cataract   . Nonexudative senile macular degeneration of retina   . Solitary pulmonary nodule     Dr. Delton Coombes    . Hyperlipidemia   . Osteoarthritis   . Anemia   . Diabetes mellitus, type 2   . Asthma   . Hypertension   . Stroke 1997  . Aortic stenosis   . Atrial fibrillation     New diagnosis August 11, 2011  . DOE (dyspnea on exertion)   . Edema   . Chronic combined systolic and diastolic congestive heart failure   . Visual impairment     severe  . Physical deconditioning   . Limited mobility     Past Surgical History  Procedure Laterality Date  . Cholecystectomy    . Tonsillectomy    . Cardiac catheterization N/A 07/18/2014    Procedure: Left Heart Cath and Coronary Angiography;  Surgeon: Tonny Bollman, MD;  Location: [facility] INVASIVE CV LAB;  Service: Cardiovascular;  Laterality: N/A;    There were no vitals filed for this visit.  Visit Diagnosis:  Difficulty walking - Plan: PT PLAN OF CARE CERT/RE-CERT  Severe aortic stenosis - Plan: PT PLAN OF CARE CERT/RE-CERT      Subjective Assessment - 08/14/14 1228    Subjective hx of SOB, awoke one morning at 3 am chest discomfort, was able to resolve with aspirin but followed  up with MD soon after to discover the aortic stenosis; now reports the need for frequent rest breaks with ambulation due to SOB and hip pain, was recently involved in pulmonary rehab over at [facility]   Currently in Pain? No/denies            [facility] PT Assessment - 08/14/14 0001    Assessment   Medical Diagnosis severe aortic stenosis   Onset Date/Surgical Date 07/14/14  approximate   Precautions   Precaution Comments visual, partially blind   Restrictions   Weight Bearing Restrictions No   Balance Screen   Has the patient fallen in the past 6 months No   Has the patient had a decrease in activity level because of a fear of falling?  No   Is the patient reluctant to leave their home because of a fear of falling?  No   Home Environment   Living Environment Private residence   Living Arrangements Alone   Home Access Stairs to enter   Entrance Stairs-Number of Steps 3   Entrance Stairs-Rails Right;Left;Cannot reach both   Home Layout One level   Prior Function   Level of Independence Independent with basic ADLs;Independent with homemaking with ambulation  uses SPC primarily  due to visual impairments   ROM / Strength   AROM / PROM / Strength AROM;Strength   AROM   Overall AROM  Within functional limits for tasks performed  grossly   Strength   Overall Strength Comments grossly 4/5 except hips and shoulders 3/5   Strength Assessment Site Hand   Right/Left hand Right;Left   Right Hand Grip (lbs) 65  R hand dominant   Left Hand Grip (lbs) 48   Ambulation/Gait   Ambulation/Gait Yes   Ambulation/Gait Assistance 6: Modified independent (Device/Increase time)   Assistive device Straight cane          [facility] Pre-Surgical Assessment - 08/14/14 0001    5 Meter Walk Test- trial 1 7.5 sec   5 Meter Walk Test- trial 2 8 sec.    5 Meter Walk Test- trial 3 7 sec.  </= 6 seconds WNL, > 6 seconds indicates slow speed   5 meter walk test average 7.5 sec   Timed Up & Go Test trial  12.7  sec.  with SPC   Comments </=12 seconds WNL, > 12 seconds indicates increased fall risk   4 Stage Balance Test tolerated for:  4 sec.   4 Stage Balance Test Position 4   comment not indicatie of high fall risk   Sit To Stand Test- trial 1 23 sec.   Comment </= 12.6 sec WNL  mild to moderate SOB   ADL/IADL Independent with: Bathing;Dressing;Meal prep;Finances  rests often with yardwork and min assist from yardman   ADL/IADL Needs Assistance with: Yard work   ADL/IADL Fraility Index Vulnerable   Other comment does some mowing but also has assist   6 Minute Walk- Baseline yes   BP (mmHg) 152/60 mmHg   HR (bpm) 50   02 Sat (%RA) 96 %   Modified Borg Scale for Dyspnea 0- Nothing at all   Perceived Rate of Exertion (Borg) 6-   6 Minute Walk Post Test yes   BP (mmHg) 160/70 mmHg   HR (bpm) 89   02 Sat (%RA) 91 %   Modified Borg Scale for Dyspnea 3- Moderate shortness of breath or breathing difficulty   Perceived Rate of Exertion (Borg) 11- Fairly light   Aerobic Endurance Distance Walked 670   Endurance additional comments No seated rest breaks required, just slow speed and increased SOB.                                     Plan - Sep 05, 2014 0719    Clinical Impression Statement Pt is a 74 yo male presenting to OP PT for evaluation prior to surgery (AVR vs TAVR) for severe aortic stenosis. Pt presents with fair to good strength with weakness proximally at hops and shoulders, ROM WNL, balance is good although pt scored at increased fall risk on the Timed up and go with 12.7 seconds. 5 meter walk test reveals slower walking speed. Pt ambulated 670' in 6 minute walk test using single point cane primarily due to severe visual impairments. O2 was 96% at rest and dropped to 91% in 6 minute walk with moderate SOB reported/observed.    PT Frequency One time visit          G-Codes - Sep 05, 2014 0724    Functional Assessment Tool Used 670' in 6 minute walk   Functional  Limitation Mobility: Walking and moving around   Mobility: Walking and Moving Around  Current Status [phone]) At least 40 percent but less than 60 percent impaired, limited or restricted   Mobility: Walking and Moving Around Goal Status [phone]) At least 40 percent but less than 60 percent impaired, limited or restricted   Mobility: Walking and Moving Around Discharge Status [phone]) At least 40 percent but less than 60 percent impaired, limited or restricted       Problem List Patient Active Problem List   Diagnosis Date Noted  . Chronic combined systolic and diastolic congestive heart failure   . Visual impairment   . Physical deconditioning   . Limited mobility   . BPH (benign prostatic hyperplasia) 07/19/2013  . Mitral regurgitation 10/12/2012  . COPD (chronic obstructive pulmonary disease)   . Hyperlipidemia with target LDL less than 100   . 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 Hematuria 11/29/2011  . Atrial fibrillation   . Hypertension   . Aortic stenosis   . Solitary pulmonary nodule   . OSA on CPAP 11/24/2009  . MACULAR DEGENERATION, SENILE, NONEXUDATIVE 11/24/2009  . Coronary atherosclerosis 11/24/2009  . OSTEOARTHRITIS, MODERATE 11/24/2009  . Obesity, Class III, BMI 40-49.9 (morbid obesity) 11/18/2009    Maylynn Orzechowski, PT 08/15/2014, 7:25 AM  [facility] [address] [place], [place], [address] Phone: [phone]   Fax:  [phone]

## 2014-08-22 ENCOUNTER — Encounter: Payer: Medicare Other | Admitting: Physician Assistant

## 2014-08-26 ENCOUNTER — Ambulatory Visit (INDEPENDENT_AMBULATORY_CARE_PROVIDER_SITE_OTHER): Payer: Medicare Other | Admitting: Thoracic Surgery (Cardiothoracic Vascular Surgery)

## 2014-08-26 ENCOUNTER — Encounter: Payer: Self-pay | Admitting: Thoracic Surgery (Cardiothoracic Vascular Surgery)

## 2014-08-26 ENCOUNTER — Telehealth: Payer: Self-pay | Admitting: Emergency Medicine

## 2014-08-26 VITALS — BP 129/51 | HR 47 | Resp 18 | Ht 74.0 in | Wt 275.0 lb

## 2014-08-26 DIAGNOSIS — I350 Nonrheumatic aortic (valve) stenosis: Secondary | ICD-10-CM

## 2014-08-26 DIAGNOSIS — I482 Chronic atrial fibrillation, unspecified: Secondary | ICD-10-CM

## 2014-08-26 DIAGNOSIS — I25110 Atherosclerotic heart disease of native coronary artery with unstable angina pectoris: Secondary | ICD-10-CM | POA: Diagnosis not present

## 2014-08-26 DIAGNOSIS — R911 Solitary pulmonary nodule: Secondary | ICD-10-CM | POA: Diagnosis not present

## 2014-08-26 NOTE — Telephone Encounter (Signed)
Called Cindy back from Dr. Orvan July office.  Scheduled patient to see Dr. Sherene Sires Thursday 6/30 at 3:30.  Arline Asp will contact patient to advise of appointment. Nothing further needed.

## 2014-08-26 NOTE — Telephone Encounter (Signed)
Cindy returned call.  She can be reached at [phone].

## 2014-08-26 NOTE — Telephone Encounter (Signed)
LM for Cindy at [facility] to schedule appt for pt

## 2014-08-26 NOTE — Telephone Encounter (Signed)
[facility] with [facility] from [facility]. Dr. Barry Dieneswens is wanting pt to be seen by RB before he is seen back in the office by them on 09/16/14. Pt already see's RB. He needs to be seen for R lung nodule. Lillia AbedLindsay, where cna pt be worked in at? thanks

## 2014-08-26 NOTE — Telephone Encounter (Signed)
RB is not in the office for the next 2 weeks. If the pt needs to been seen ASAP then another MD will have to see him. Thanks.

## 2014-08-26 NOTE — Progress Notes (Signed)
[address]       [place] [address]             [phone]     CARDIOTHORACIC SURGERY OFFICE NOTE  Referring Provider is Carlena Bjornstad, MD PCP is Scarlette Calico, MD   HPI:  Patient returns for follow-up of severe multivessel coronary artery disease with moderate aortic stenosis.  He was originally seen in consultation on 08/02/2014. Since then he underwent pulmonary function testing, CT angiogram of the chest, and a formal physical therapy evaluation. He returns to the office today to discuss the results of these tests and consider treatment options further. He reports no new problems or complaints over the last few weeks. He reports stable symptoms of chronic exertional shortness of breath without any associated symptoms of chest pain or chest tightness.  His shortness of breath has not gotten any worse.  In fact, the patient states he was able to mow his lawn yesterday without too much trouble.   Current Outpatient Prescriptions  Medication Sig Dispense Refill  . Aflibercept 2 MG/0.05ML SOLN Inject into the eye as directed. Injection into right eye every 4 weeks    . albuterol (PROVENTIL HFA;VENTOLIN HFA) 108 (90 BASE) MCG/ACT inhaler Inhale 2 puffs into the lungs every 6 (six) hours as needed for wheezing or shortness of breath.    Marland Kitchen aspirin EC 81 MG tablet Take 1 tablet (81 mg total) by mouth daily. 90 tablet 3  . atenolol (TENORMIN) 50 MG tablet Take 25 mg by mouth daily. Taking one half tablet (25 mg) by mouth daily    . B-D INS SYRINGE 0.5CC/30GX1/2" 30G X 1/2" 0.5 ML MISC USE AS DIRECTED. 200 each PRN  . Blood Glucose Monitoring Suppl (TRUETRACK BLOOD GLUCOSE) W/DEVICE KIT Test up to TID Dx: E11.39 1 each 2  . glucose blood (TRUETEST TEST) test strip Use as directed three times daily to check blood sugar.  Diagnosis code E11.39. 300 each 1  . glucose blood (TRUETRACK TEST) test strip Test up to TID Dx:E11.39 100 each 12  . hydrochlorothiazide  (HYDRODIURIL) 25 MG tablet Take 25 mg by mouth daily.    . Insulin Isophane & Regular Human (HUMULIN 70/30 KWIKPEN) (70-30) 100 UNIT/ML PEN Inject 30 Units into the skin 2 (two) times daily with a meal. 15 mL 11  . lisinopril (PRINIVIL,ZESTRIL) 20 MG tablet Take 20 mg by mouth daily.    . metFORMIN (GLUCOPHAGE) 1000 MG tablet Take 1,000 mg by mouth 2 (two) times daily with a meal.    . minocycline (MINOCIN,DYNACIN) 100 MG capsule Take 100 mg by mouth 3 (three) times a week.     . nitroGLYCERIN (NITROSTAT) 0.4 MG SL tablet Place 1 tablet (0.4 mg total) under the tongue every 5 (five) minutes as needed for chest pain. 50 tablet 3  . Omega-3 Fatty Acids (FISH OIL) 1000 MG CAPS Take 1 capsule by mouth 2 (two) times daily.     Marland Kitchen Respiratory Therapy Supplies (FLUTTER) DEVI Use as directed 1 each 0  . simvastatin (ZOCOR) 40 MG tablet Take 40 mg by mouth daily at 6 PM.     No current facility-administered medications for this visit.      Physical Exam:   BP 129/51 mmHg  Pulse 47  Resp 18  Ht 6' 2"  (1.88 m)  Wt 275 lb (124.739 kg)  BMI 35.29 kg/m2  SpO2 95%  General:  Obese but well appearing  Chest:   Clear  to auscultation  CV:   Regular rate and rhythm with systolic murmur  Incisions:  n/a  Abdomen:  Soft and nontender  Extremities:  Warm and well-perfused with no lower extremity edema  Diagnostic Tests:  CT ANGIOGRAPHY CHEST WITH CONTRAST  TECHNIQUE: Multidetector CT imaging of the chest was performed using the standard protocol during bolus administration of intravenous contrast. Multiplanar CT image reconstructions and MIPs were obtained to evaluate the vascular anatomy.  CONTRAST: 16m OMNIPAQUE IOHEXOL 350 MG/ML SOLN  COMPARISON: Chest radiograph October 07, 2011  FINDINGS: There is no demonstrable pulmonary embolus. There is atherosclerotic change in the aorta without demonstrable aneurysmal dilatation.  There are multiple foci of coronary artery  calcification.  There is focal opacity abutting the pleura in the posterior segment of the right lower lobe. This area represents either focal atelectasis or early pneumonia. Atelectatic changes also noted to a lesser degree in the left base.  There is a nodular opacity in the posterior segment of the right upper lobe measuring 1.7 x 1.8 x 1.6 cm with peripheral calcification. On axial slice 22 series 6, there is a focal nodular opacity measuring 9 x 8 mm with a slightly irregular contour. No other nodular type opacities are appreciable.  There are multiple small mediastinal lymph nodes, but by size criteria there is no adenopathy. Thyroid appears normal. The pericardium is not thickened.  Visualized upper abdominal structures appear normal except for atherosclerotic change.  There is degenerative change in the thoracic spine. There are no blastic or lytic bone lesions.  Review of the MIP images confirms the above findings.  IMPRESSION: No demonstrable pulmonary embolus.  Small area of either atelectasis or rather minimal pneumonia in the posterior right base.  There is a nodular opacity in each upper lobe, larger on the right than on the left. There is peripheral but not central calcification the lesion on the right. This nodular opacity on the right is present on prior chest radiograph and is not convincingly changed. The nodular opacity on the left is not appreciable on prior chest radiograph. A followup chest CT in 10-12 weeks is advised to further evaluate, particularly given the slightly irregular contour of the opacity on the left.  There are multiple small lymph nodes but no frank adenopathy by size criteria.  Extensive coronary artery calcification. There is calcification in the aorta without aneurysm.   Electronically Signed  By: WLowella GripIII M.D.  On: 08/12/2014 10:05   Pulmonary Function  Tests  Baseline      Post-bronchodilator  FVC  2.83 L  (59% predicted) FVC  3.09 L  (65% predicted) FEV1  1.72 L  (50% predicted) FEV1  1.99 L  (57% predicted) FEF25-75 0.71 L  (28% predicted) FEF25-75 1.27 L  (51% predicted)  TLC  5.77 L  (75% predicted) RV  3.10 L  (113% predicted) DLCO  43% predicted      Impression:  Patient has severe three-vessel coronary artery disease with moderate aortic stenosis. I have personally reviewed the patient's recent transthoracic echocardiogram, diagnostic cardiac catheterization, CT angiogram, PFT's and physical therapy evaluation.  Echocardiogram confirms the presence of severe thickening and restricted leaflet mobility involving all 3 leaflets of the aortic valve. Peak velocity across aortic valve measures approximately 3.2 m/s corresponding to mean transvalvular gradient of 25 mmHg. Left ventricular systolic function appears to be at least mild to moderately reduced. Cardiac catheterization reveals the presence of severe three-vessel coronary artery disease with coronary anatomy relatively unfavorable for percutaneous coronary  intervention in this patient with long-standing diabetes mellitus.  CT angiogram of the chest is notable for the absence of pulmonary embolus. The patient does have pulmonary nodules in both lungs, and the nodule in the right lung measures greater than 1 cm in diameter. According to the patient he has known about this for years and previously underwent CT scans and PET scans while he lived in [place]. He reportedly has given these old scans to Dr. Lamonte Sakai in the past for review. There are no follow-up scans in our system in [place] for comparison. Pulmonary function tests confirm the presence of moderate to severe COPD. The patient does have some improvement with bronchodilation therapy. Diffusion capacity is moderate to severely reduced at baseline.  I feel that this gentleman would best be treated with a combination of  surgical revascularization and aortic valve replacement, but there is no question that risks associated with conventional surgery will be very high. I am most concerned by the patient's risks for the development of chronic respiratory failure, and formal pulmonary function testing might be helpful to better characterize his operative risks. Concomitant Maze procedure could be considered if a decision is made to proceed with high risk aortic valve replacement and coronary artery bypass grafting.    Plan:  I have discussed options at length with the patient in the office today. Given the fact that he remains clinically very stable, it seems prudent to have him see Dr. Lamonte Sakai in follow-up to specifically ask about risks of respiratory failure with elective aortic valve replacement and coronary artery bypass grafting. Should the patient's current regimen of bronchodilation her treatment be adjusted prior to surgery?  Do we have previous CT scans for comparison regarding the patient's right lung nodule or should a PET CT scan be performed?  The patient will return in 3 weeks after his evaluation with Dr. Lamonte Sakai and possibly schedule high risk elective surgery at that time.  I spent in excess of 15 minutes during the conduct of this office consultation and >50% of this time involved direct face-to-face encounter with the patient for counseling and/or coordination of their care.    Valentina Gu. Roxy Manns, MD 08/26/2014 10:28 AM

## 2014-08-29 ENCOUNTER — Encounter: Payer: Self-pay | Admitting: Internal Medicine

## 2014-08-29 ENCOUNTER — Ambulatory Visit (INDEPENDENT_AMBULATORY_CARE_PROVIDER_SITE_OTHER): Payer: Medicare Other | Admitting: Internal Medicine

## 2014-08-29 VITALS — BP 130/58 | HR 58 | Ht 74.0 in | Wt 282.8 lb

## 2014-08-29 DIAGNOSIS — J449 Chronic obstructive pulmonary disease, unspecified: Secondary | ICD-10-CM

## 2014-08-29 NOTE — Progress Notes (Signed)
Subjective:     Patient ID: Charles [name], male   DOB: 1940-12-04,     MRN: [id number]  HPI  48 yowm  quit smoking 1996 referred to pulmonary clinic 08/29/14  by Dr Cornelius Moras for pulmonary clearance for AVR/CABG- has been seen prev by Dr Delton Coombes with dx of copd characterized by CB but cough resolved and stopped all meds s worsening cough or sob.   08/29/2014 Preo op consult  Minie Roadcap / GOLD II copd not on rx / no problem with rehab s 02   Chief Complaint  Patient presents with  . Follow-up    Pt of Dr Kavin Leech needing pulmonary clearance for heart surgery. He denies any respiratory co's today.    can walk a block s 02 slow pace  / cpap at hs  On 02   No obvious day to day or daytime variabilty or assoc chronic cough or cp or chest tightness, subjective wheeze overt sinus or hb symptoms. No unusual exp hx or h/o childhood pna/ asthma or knowledge of premature birth.  Sleeping ok without nocturnal  or early am exacerbation  of respiratory  c/o's or need for noct saba. Also denies any obvious fluctuation of symptoms with weather or environmental changes or other aggravating or alleviating factors except as outlined above   Current Medications, Allergies, Complete Past Medical History, Past Surgical History, Family History, and Social History were reviewed in [employer] record.  ROS  The following are not active complaints unless bolded sore throat, dysphagia, dental problems, itching, sneezing,  nasal congestion or excess/ purulent secretions, ear ache,   fever, chills, sweats, unintended wt loss, pleuritic or exertional cp, hemoptysis,  orthopnea pnd or leg swelling, presyncope, palpitations, abdominal pain, anorexia, nausea, vomiting, diarrhea  or change in bowel or urinary habits, change in stools or urine, dysuria,hematuria,  rash, arthralgias, visual complaints, headache, numbness weakness or ataxia or problems with walking or coordination,  change in mood/affect or memory.         Review of Systems     Objective:   Physical Exam    amb obese pleasant wm nad  Wt Readings from Last 3 Encounters:  08/29/14 282 lb 12.8 oz (128.277 kg)  08/26/14 275 lb (124.739 kg)  08/02/14 285 lb (129.275 kg)    Vital signs reviewed   HEENT: nl dentition, turbinates, and orophanx. Nl external ear canals without cough reflex   NECK :  without JVD/Nodes/TM/ nl carotid upstrokes bilaterally   LUNGS: no acc muscle use, clear to A and P bilaterally without cough on insp or exp maneuvers   CV:  RRR  no s3   III/VI sem  No  increase in P2, no edema   ABD:  soft and nontender with nl excursion in the supine position. No bruits or organomegaly, bowel sounds nl  MS:  warm without deformities, calf tenderness, cyanosis or clubbing  SKIN: warm and dry without lesions    NEURO:  alert, approp, no deficits    I personally reviewed images and agree with radiology impression as follows:  CT with contrast   08/12/14   No demonstrable pulmonary embolus.  Small area of either atelectasis or rather minimal pneumonia in the posterior right base.  There is a nodular opacity in each upper lobe, larger on the right than on the left. There is peripheral but not central calcification the lesion on the right. This nodular opacity on the right is present on prior chest radiograph and is not convincingly  changed. The nodular opacity on the left is not appreciable on prior chest radiograph. A followup chest CT in 10-12 weeks is advised to further evaluate, particularly given the slightly irregular contour of the opacity on the left.  There are multiple small lymph nodes but no frank adenopathy by size criteria.  Extensive coronary artery calcification. There is calcification in the aorta without aneurysm.      Assessment:

## 2014-08-29 NOTE — Patient Instructions (Signed)
You are clear for surgery with the biggest concern being the weight on your chest by your abdomen so will need early mobilization if possible and I will also be recommenign perioperative duonebs every 6 hours   If not happy with your breathing post op may need to consider trial off yor lisinopril

## 2014-09-01 ENCOUNTER — Encounter: Payer: Self-pay | Admitting: Internal Medicine

## 2014-09-01 NOTE — Assessment & Plan Note (Addendum)
PFTs  08/12/14  FEV 1 1.99 (57%) ratio 64 p 15% improvement p saba and DLCO 43 corrects to 79%  - ABG's 08/12/14  resting pa02 to 69 RA with nl pC02   This is mild/ moderate copd with restrictive component related to obesity making it look worse that it really is and that's why he's been able to stop all meds s sequelae to date but has relatively low resting pa02.  This is clearly not prohibitive to non-lung surgery and note the nodules may be tempting to address while the chest is open and could tolerate an excisional bx but I'm not really enthusiastic about this approach as the risk benefit is less clear and will defer this to Dr Clarence's judgement.  He would benefit from early mobilization /IS/ minimization of narcs and periop duoneb but is cleared for surgery unless flare occurs in interim   Discussed in detail all the  indications, usual  Pulmonary risks and alternatives(no medical rx for AS available)   relative to the benefits with patient who agrees to proceed with surgery as planned

## 2014-09-01 NOTE — Assessment & Plan Note (Signed)
Body mass index is 36.29 kg/(m^2).  Lab Results  Component Value Date   TSH 1.93 06/05/2014     Contributing to operative risk and chronic doe/ needs to achieve and maintain neg calorie balance longterm  > f/u primary care  Planned

## 2014-09-10 ENCOUNTER — Telehealth: Payer: Self-pay | Admitting: Emergency Medicine

## 2014-09-10 NOTE — Telephone Encounter (Signed)
Correct # to call is [phone]. Called Spoke with [facility]. They are asking for pt SLEEP STUDY. I advised I have the CPAP titration study. This is being faxed over to them. Nothing further needed

## 2014-09-16 ENCOUNTER — Encounter: Payer: Self-pay | Admitting: Thoracic Surgery (Cardiothoracic Vascular Surgery)

## 2014-09-16 ENCOUNTER — Ambulatory Visit (INDEPENDENT_AMBULATORY_CARE_PROVIDER_SITE_OTHER): Payer: Medicare Other | Admitting: Thoracic Surgery (Cardiothoracic Vascular Surgery)

## 2014-09-16 VITALS — BP 129/65 | HR 60 | Resp 20 | Ht 74.0 in | Wt 282.0 lb

## 2014-09-16 DIAGNOSIS — R911 Solitary pulmonary nodule: Secondary | ICD-10-CM | POA: Diagnosis not present

## 2014-09-16 DIAGNOSIS — I350 Nonrheumatic aortic (valve) stenosis: Secondary | ICD-10-CM

## 2014-09-16 DIAGNOSIS — I482 Chronic atrial fibrillation, unspecified: Secondary | ICD-10-CM

## 2014-09-16 DIAGNOSIS — I25110 Atherosclerotic heart disease of native coronary artery with unstable angina pectoris: Secondary | ICD-10-CM

## 2014-09-16 DIAGNOSIS — I5042 Chronic combined systolic (congestive) and diastolic (congestive) heart failure: Secondary | ICD-10-CM

## 2014-09-16 MED ORDER — AMIODARONE HCL 200 MG PO TABS: 200.0000 mg | ORAL_TABLET | Freq: Two times a day (BID) | ORAL | Status: DC

## 2014-09-16 NOTE — Patient Instructions (Addendum)
Begin taking amiodarone and stop taking Zocor 7 days before your surgery  The patient should continue all previous medications without changes at this time except stop taking Omega-3 fatty acids (fish oil caps)  Patient should continue taking all other medications without change through the day before surgery.  Patient should have nothing to eat or drink after midnight the night before surgery.  On the morning of surgery patient should take only Atenolol with a sip of water.

## 2014-09-16 NOTE — Progress Notes (Signed)
[address]       [place] [address]             [phone]     CARDIOTHORACIC SURGERY OFFICE NOTE  Referring Provider is Carlena Bjornstad, MD PCP is Scarlette Calico, MD   HPI:  Patient returns to the office today for follow-up of severe three-vessel coronary artery disease, moderate aortic stenosis, and atrial fibrillation. He was last seen here in our office on 08/26/2014. At that time we referred him back to see Dr. Lamonte Sakai who has cared for him in the past. The patient apparently had multiple previous CT scans of the chest and PET scan's performed when he lived in [place] documenting the presence of chronic pulmonary nodules. These scans have been given to Dr. Lamonte Sakai the past.  Recent CT scan of the chest performed locally demonstrated the presence of several pulmonary nodules, and the patient's old scans were not available for comparison. The patient was referred back to see Dr. Lamonte Sakai to comment upon the nodules and whether or not PET scan might be appropriate. In addition, we had hoped to glean input from Dr. Lamonte Sakai regarding the patient's underlying pulmonary risk related to surgery and whether or not there might be benefit to preoperative adjustment of his medical therapy for COPD. The patient was evaluated by Dr. Melvyn Novas on 08/29/2014. The patient was cleared for surgery. No comments were made regarding the patient's pulmonary nodules. The patient returns to our office today for follow-up with hopes to proceed with surgery in the near future.  The patient reports stable symptoms of exertional shortness of breath and fatigue.  Symptoms have not progressed. He has never had any symptoms of chest discomfort either with activity or at rest. The remainder of his review of systems is unchanged from previously.   Current Outpatient Prescriptions  Medication Sig Dispense Refill  . Aflibercept 2 MG/0.05ML SOLN Inject into the eye as directed. Injection into right eye every  4 weeks    . albuterol (PROVENTIL HFA;VENTOLIN HFA) 108 (90 BASE) MCG/ACT inhaler Inhale 2 puffs into the lungs every 6 (six) hours as needed for wheezing or shortness of breath.    Marland Kitchen aspirin EC 81 MG tablet Take 1 tablet (81 mg total) by mouth daily. 90 tablet 3  . atenolol (TENORMIN) 50 MG tablet Take 25 mg by mouth daily. Taking one half tablet (25 mg) by mouth daily    . B-D INS SYRINGE 0.5CC/30GX1/2" 30G X 1/2" 0.5 ML MISC USE AS DIRECTED. 200 each PRN  . Blood Glucose Monitoring Suppl (TRUETRACK BLOOD GLUCOSE) W/DEVICE KIT Test up to TID Dx: E11.39 1 each 2  . glucose blood (TRUETRACK TEST) test strip Test up to TID Dx:E11.39 100 each 12  . hydrochlorothiazide (HYDRODIURIL) 25 MG tablet Take 25 mg by mouth daily.    . Insulin Isophane & Regular Human (HUMULIN 70/30 KWIKPEN) (70-30) 100 UNIT/ML PEN Inject 30 Units into the skin 2 (two) times daily with a meal. 15 mL 11  . lisinopril (PRINIVIL,ZESTRIL) 20 MG tablet Take 20 mg by mouth daily.    . metFORMIN (GLUCOPHAGE) 1000 MG tablet Take 1,000 mg by mouth 2 (two) times daily with a meal.    . minocycline (MINOCIN,DYNACIN) 100 MG capsule Take 100 mg by mouth 3 (three) times a week.     . nitroGLYCERIN (NITROSTAT) 0.4 MG SL tablet Place 1 tablet (0.4 mg total) under the tongue every 5 (five) minutes as needed for  chest pain. 50 tablet 3  . Omega-3 Fatty Acids (FISH OIL) 1000 MG CAPS Take 1 capsule by mouth 2 (two) times daily.     Marland Kitchen Respiratory Therapy Supplies (FLUTTER) DEVI Use as directed 1 each 0  . simvastatin (ZOCOR) 40 MG tablet Take 40 mg by mouth daily at 6 PM.    . amiodarone (PACERONE) 200 MG tablet Take 1 tablet (200 mg total) by mouth 2 (two) times daily. 30 tablet 0   No current facility-administered medications for this visit.      Physical Exam:   BP 129/65 mmHg  Pulse 60  Resp 20  Ht _0  (1.88 m)  Wt 282 lb (127.914 kg)  BMI 36.19 kg/m2  SpO2 96%  General:  Obese, NAD  Chest:   Fairly clear  CV:   Irregular w/  systolic murmur  Incisions:  n/a  Abdomen:  Obese, soft, non tender  Extremities:  Warm, well-perfused, mild LE edema, severe chronic skin changes c/w venous insufficiency and DM  Diagnostic Tests:  n/a   Impression:  Patient has severe three-vessel coronary artery disease with moderate aortic stenosis. I have personally reviewed the patient's recent transthoracic echocardiogram, diagnostic cardiac catheterization, CT angiogram, PFT's and physical therapy evaluation. Echocardiogram confirms the presence of severe thickening and restricted leaflet mobility involving all 3 leaflets of the aortic valve. Peak velocity across aortic valve measures approximately 3.2 m/s corresponding to mean transvalvular gradient of 25 mmHg. Left ventricular systolic function appears to be at least mild to moderately reduced. Cardiac catheterization reveals the presence of severe three-vessel coronary artery disease with coronary anatomy relatively unfavorable for percutaneous coronary intervention in this patient with long-standing diabetes mellitus. CT angiogram of the chest is notable for the absence of pulmonary embolus. The patient does have pulmonary nodules in both lungs, and the nodule in the right lung measures greater than 1 cm in diameter. According to the patient he has known about this for years and previously underwent CT scans and PET scans while he lived in [place]. He reportedly has given these old scans to Dr. Lamonte Sakai in the past for review. There are no follow-up scans in our system in [place] for comparison. Pulmonary function tests confirm the presence of moderate COPD.  Diffusion capacity is moderate to severely reduced at baseline. I feel that this gentleman would best be treated with a combination of surgical revascularization and aortic valve replacement, but there is no question that risks associated with conventional surgery will be very high. I am most concerned by the patient's risks  for the development of chronic respiratory failure.  Concomitant Maze procedure could be considered if a decision is made to proceed with high risk aortic valve replacement and coronary artery bypass grafting.   Plan:  The patient was again counseled at length regarding treatment alternatives for management of severe multi-vessel coronary artery disease and aortic stenosis including continued medical therapy versus proceeding with aortic valve replacement and CABG in the near future.  The natural history of aortic stenosis and CAD were reviewed, as was long term prognosis with medical therapy alone.   The patient understands and accepts all potential associated risks of surgery including but not limited to risk of death, stroke, myocardial infarction, congestive heart failure, respiratory failure, renal failure, pneumonia, bleeding requiring blood transfusion and or reexploration, arrhythmia, heart block or bradycardia requiring permanent pacemaker, aortic dissection or other major vascular complication, pleural effusions or other delayed complications related to continued congestive heart failure, and other  late complications related to valve replacement including late recurrence of symptomatic ischemic heart disease and/or structural valve deterioration and failure, thrombosis, endocarditis, or paravalvular leak.  The relative risks and benefits of performing a maze procedure at the time of their surgery was discussed at length, including the expected likelihood of long term freedom from recurrent symptomatic atrial fibrillation and/or atrial flutter.  We have also discussed expectations for the patient's postoperative convalescence.  The fact that the patient lives alone and does not have family members close by his been discussed including the likelihood that he will need at least short-term placement and some type of facility for rehabilitation when he leaves the hospital.  We tentatively plan to proceed  with surgery on Wednesday, 10/16/2014. The patient will return for follow-up on Monday, 10/14/2014. He has been given a prescription for amiodarone to begin 7 days prior to surgery. He has been instructed to stop taking simvastatin when he begins taking amiodarone. All of his questions have been addressed.    I spent in excess of 30 minutes during the conduct of this office consultation and >50% of this time involved direct face-to-face encounter with the patient for counseling and/or coordination of their care.   Valentina Gu. Roxy Manns, MD 09/16/2014 6:09 PM

## 2014-09-17 ENCOUNTER — Other Ambulatory Visit: Payer: Self-pay

## 2014-09-17 ENCOUNTER — Ambulatory Visit: Payer: Medicare Other | Admitting: Cardiology

## 2014-09-17 DIAGNOSIS — I350 Nonrheumatic aortic (valve) stenosis: Secondary | ICD-10-CM

## 2014-09-17 DIAGNOSIS — I480 Paroxysmal atrial fibrillation: Secondary | ICD-10-CM

## 2014-09-17 DIAGNOSIS — I25119 Atherosclerotic heart disease of native coronary artery with unspecified angina pectoris: Secondary | ICD-10-CM

## 2014-09-26 LAB — TRANSTHORACIC ECHOCARDIOGRAM

## 2014-10-14 ENCOUNTER — Encounter (HOSPITAL_COMMUNITY): Payer: Self-pay

## 2014-10-14 ENCOUNTER — Ambulatory Visit (HOSPITAL_COMMUNITY)
Admission: RE | Admit: 2014-10-14 | Discharge: 2014-10-14 | Disposition: A | Discharge status: Home / Self Care | Payer: Medicare Other | Source: Ambulatory Visit | Attending: Thoracic Surgery (Cardiothoracic Vascular Surgery) | Admitting: Thoracic Surgery (Cardiothoracic Vascular Surgery)

## 2014-10-14 ENCOUNTER — Other Ambulatory Visit (HOSPITAL_COMMUNITY): Payer: Medicare Other

## 2014-10-14 ENCOUNTER — Encounter (HOSPITAL_COMMUNITY)
Admission: RE | Admit: 2014-10-14 | Discharge: 2014-10-14 | Disposition: A | Discharge status: Home / Self Care | Payer: Medicare Other | Source: Ambulatory Visit | Attending: Thoracic Surgery (Cardiothoracic Vascular Surgery) | Admitting: Thoracic Surgery (Cardiothoracic Vascular Surgery)

## 2014-10-14 ENCOUNTER — Other Ambulatory Visit (HOSPITAL_COMMUNITY): Payer: Self-pay | Admitting: Respiratory Therapy

## 2014-10-14 ENCOUNTER — Encounter: Payer: Self-pay | Admitting: Thoracic Surgery (Cardiothoracic Vascular Surgery)

## 2014-10-14 ENCOUNTER — Ambulatory Visit (INDEPENDENT_AMBULATORY_CARE_PROVIDER_SITE_OTHER): Payer: Medicare Other | Admitting: Thoracic Surgery (Cardiothoracic Vascular Surgery)

## 2014-10-14 VITALS — BP 141/61 | HR 51 | Temp 97.8°F | Resp 18 | Ht 74.0 in | Wt 281.2 lb

## 2014-10-14 VITALS — BP 140/54 | HR 60 | Resp 20 | Ht 74.0 in | Wt 280.0 lb

## 2014-10-14 DIAGNOSIS — Z01818 Encounter for other preprocedural examination: Secondary | ICD-10-CM

## 2014-10-14 DIAGNOSIS — I350 Nonrheumatic aortic (valve) stenosis: Secondary | ICD-10-CM

## 2014-10-14 DIAGNOSIS — I25119 Atherosclerotic heart disease of native coronary artery with unspecified angina pectoris: Secondary | ICD-10-CM

## 2014-10-14 DIAGNOSIS — Z0183 Encounter for blood typing: Secondary | ICD-10-CM

## 2014-10-14 DIAGNOSIS — Z79899 Other long term (current) drug therapy: Secondary | ICD-10-CM | POA: Insufficient documentation

## 2014-10-14 DIAGNOSIS — I2510 Atherosclerotic heart disease of native coronary artery without angina pectoris: Secondary | ICD-10-CM

## 2014-10-14 DIAGNOSIS — I480 Paroxysmal atrial fibrillation: Secondary | ICD-10-CM

## 2014-10-14 DIAGNOSIS — I25110 Atherosclerotic heart disease of native coronary artery with unstable angina pectoris: Secondary | ICD-10-CM | POA: Diagnosis not present

## 2014-10-14 DIAGNOSIS — I482 Chronic atrial fibrillation, unspecified: Secondary | ICD-10-CM

## 2014-10-14 DIAGNOSIS — I4891 Unspecified atrial fibrillation: Secondary | ICD-10-CM | POA: Insufficient documentation

## 2014-10-14 DIAGNOSIS — I5042 Chronic combined systolic (congestive) and diastolic (congestive) heart failure: Secondary | ICD-10-CM

## 2014-10-14 DIAGNOSIS — Z01812 Encounter for preprocedural laboratory examination: Secondary | ICD-10-CM | POA: Insufficient documentation

## 2014-10-14 DIAGNOSIS — R911 Solitary pulmonary nodule: Secondary | ICD-10-CM | POA: Diagnosis not present

## 2014-10-14 HISTORY — DX: Cardiac murmur, unspecified: R01.1

## 2014-10-14 HISTORY — DX: Cardiac arrhythmia, unspecified: I49.9

## 2014-10-14 LAB — COMPREHENSIVE METABOLIC PANEL
ALBUMIN: 3.7 g/dL (ref 3.5–5.0)
ALK PHOS: 69 U/L (ref 38–126)
ALT: 16 U/L — AB (ref 17–63)
AST: 36 U/L (ref 15–41)
Anion gap: 11 (ref 5–15)
BILIRUBIN TOTAL: 0.8 mg/dL (ref 0.3–1.2)
BUN: 25 mg/dL — AB (ref 6–20)
CALCIUM: 9.2 mg/dL (ref 8.9–10.3)
CO2: 27 mmol/L (ref 22–32)
CREATININE: 1.47 mg/dL — AB (ref 0.61–1.24)
Chloride: 102 mmol/L (ref 101–111)
GFR calc Af Amer: 52 mL/min — ABNORMAL LOW (ref >60)
GFR calc non Af Amer: 45 mL/min — ABNORMAL LOW (ref >60)
GLUCOSE: 333 mg/dL — AB (ref 65–99)
Potassium: 4.1 mmol/L (ref 3.5–5.1)
SODIUM: 140 mmol/L (ref 135–145)
TOTAL PROTEIN: 6.9 g/dL (ref 6.5–8.1)

## 2014-10-14 LAB — BLOOD GAS, ARTERIAL
ACID-BASE EXCESS: 2.7 mmol/L — AB (ref 0.0–2.0)
Bicarbonate: 26.2 mEq/L — ABNORMAL HIGH (ref 20.0–24.0)
Drawn by: 421801
FIO2: 0.21
O2 SAT: 87.3 %
PATIENT TEMPERATURE: 98.6
PCO2 ART: 36.3 mmHg (ref 35.0–45.0)
PO2 ART: 53 mmHg — AB (ref 80.0–100.0)
TCO2: 27.3 mmol/L (ref 0–100)
pH, Arterial: 7.471 — ABNORMAL HIGH (ref 7.350–7.450)

## 2014-10-14 LAB — URINALYSIS, ROUTINE W REFLEX MICROSCOPIC
BILIRUBIN URINE: NEGATIVE
HGB URINE DIPSTICK: NEGATIVE
Ketones, ur: NEGATIVE mg/dL
Leukocytes, UA: NEGATIVE
Nitrite: NEGATIVE
PROTEIN: NEGATIVE mg/dL
SPECIFIC GRAVITY, URINE: 1.022 (ref 1.005–1.030)
Urobilinogen, UA: 1 mg/dL (ref 0.0–1.0)
pH: 6.5 (ref 5.0–8.0)

## 2014-10-14 LAB — CBC
HEMATOCRIT: 43.4 % (ref 39.0–52.0)
HEMOGLOBIN: 14.1 g/dL (ref 13.0–17.0)
MCH: 30.3 pg (ref 26.0–34.0)
MCHC: 32.5 g/dL (ref 30.0–36.0)
MCV: 93.1 fL (ref 78.0–100.0)
Platelets: 190 10*3/uL (ref 150–400)
RBC: 4.66 MIL/uL (ref 4.22–5.81)
RDW: 14.3 % (ref 11.5–15.5)
WBC: 8.4 10*3/uL (ref 4.0–10.5)

## 2014-10-14 LAB — SURGICAL PCR SCREEN
MRSA, PCR: NEGATIVE
Staphylococcus aureus: NEGATIVE

## 2014-10-14 LAB — GLUCOSE, CAPILLARY: Glucose-Capillary: 289 mg/dL — ABNORMAL HIGH (ref 65–99)

## 2014-10-14 LAB — PROTIME-INR
INR: 1.13 (ref 0.00–1.49)
Prothrombin Time: 14.7 seconds (ref 11.6–15.2)

## 2014-10-14 LAB — URINE MICROSCOPIC-ADD ON

## 2014-10-14 LAB — ABO/RH: ABO/RH(D): A POS

## 2014-10-14 LAB — APTT: aPTT: 32 seconds (ref 24–37)

## 2014-10-14 NOTE — Progress Notes (Signed)
VASCULAR LAB PRELIMINARY  PRELIMINARY  PRELIMINARY  PRELIMINARY  Pre-op Cardiac Surgery  Carotid Findings:  Bilateral:  1-39% ICA stenosis.  Vertebral artery flow is antegrade.      Upper Extremity Right Left  Brachial Pressures 127  Triphasic  127  Triphasic   Radial Waveforms Triphasic  Triphasic   Ulnar Waveforms Triphasic  Triphasic  Palmar Arch (Allen's Test) Within normal limits  Within normal limits      Lower  Extremity Right Left  Dorsalis Pedis 148 Monophasic  134  Biphasic   Anterior Tibial    Posterior Tibial 156 Triphasic  146 Triphasic   Ankle/Brachial Indices 1.23 1.15     Charles [name], RVT 10/14/2014, 4:34 PM

## 2014-10-14 NOTE — Patient Instructions (Signed)
Patient has been instructed to stop taking metformin  Patient should continue taking all other medications without change through the day before surgery.  Patient should have nothing to eat or drink after midnight the night before surgery.  On the morning of surgery patient should take only atenolol with a sip of water.

## 2014-10-14 NOTE — Pre-Procedure Instructions (Addendum)
Charles [name]  10/14/2014      [facility] - [place], [place] - [address] [place] [address] Phone: [phone] Fax: [phone]    Your procedure is scheduled on 10/16/14  Report to [facility] short stay admitting at 630 A.M.  Call this number if you have problems the morning of surgery:  [phone]   Remember:  Do not eat food or drink liquids after midnight.  Take these medicines the morning of surgery with A SIP OF WATER inhaler if needed-bring to hosp, ,atenolol, nitro if needed  STOP all herbel meds, nsaids (aleve,naproxen,advil,ibuprofen) now  Aspirin per dr   How to Manage Your Diabetes Before Surgery   Why is it important to control my blood sugar before and after surgery?   Improving blood sugar levels before and after surgery helps healing and can limit problems.  A way of improving blood sugar control is eating a healthy diet by:  - Eating less sugar and carbohydrates  - Increasing activity/exercise  - Talk with your doctor about reaching your blood sugar goals  High blood sugars (greater than 180 mg/dL) can raise your risk of infections and slow down your recovery so you will need to focus on controlling your diabetes during the weeks before surgery.  Make sure that the doctor who takes care of your diabetes knows about your planned surgery including the date and location.  How do I manage my blood sugars before surgery?   Check your blood sugar at least 4 times a day, 2 days before surgery to make sure that they are not too high or low.   Check your blood sugar the morning of your surgery when you wake up and every 2   hours until you get to the Short-Stay unit.  If your blood sugar is less than 70 mg/dL, you will need to treat for low blood sugar by:  Treat a low blood sugar (less than 70 mg/dL) with 1/2 cup of clear juice (cranberry or apple), 4 glucose tablets, OR glucose gel.  Recheck  blood sugar in 15 minutes after treatment (to make sure it is greater than 70 mg/dL).  If blood sugar is not greater than 70 mg/dL on re-check, call [phone] for further instructions.   Report your blood sugar to the Short-Stay nurse when you get to Short-Stay.  References:  [facility], 2007 "How to Manage your Diabetes Before and After Surgery".  What do I do about my diabetes medications?   Do not take oral diabetes medicines (pills) the morning of surgery.(metformin)    THE NIGHT BEFORE SURGERY, take 21 units of humulin 70/30 Insulin.  THE MORNING OF SURGERY, no insulin(type II)      Do not wear jewelry, make-up or nail polish.  Do not wear lotions, powders, or perfumes.  You may wear deodorant.  Do not shave 48 hours prior to surgery.  Men may shave face and neck.  Do not bring valuables to the hospital.  [facility] is not responsible for any belongings or valuables.  Contacts, dentures or bridgework may not be worn into surgery.  Leave your suitcase in the car.  After surgery it may be brought to your room.  For patients admitted to the hospital, discharge time will be determined by your treatment team.  Patients discharged the day of surgery will not be allowed to drive home.   Name and phone number of your driver:  Special instructions:   Special Instructions: [place] - Preparing for Surgery  Before surgery, you can play an important role.  Because skin is not sterile, your skin needs to be as free of germs as possible.  You can reduce the number of germs on you skin by washing with CHG (chlorahexidine gluconate) soap before surgery.  CHG is an antiseptic cleaner which kills germs and bonds with the skin to continue killing germs even after washing.  Please DO NOT use if you have an allergy to CHG or antibacterial soaps.  If your skin becomes reddened/irritated stop using the CHG and inform your nurse when you arrive at Short  Stay.  Do not shave (including legs and underarms) for at least 48 hours prior to the first CHG shower.  You may shave your face.  Please follow these instructions carefully:   1.  Shower with CHG Soap the night before surgery and the morning of Surgery.  2.  If you choose to wash your hair, wash your hair first as usual with your normal shampoo.  3.  After you shampoo, rinse your hair and body thoroughly to remove the Shampoo.  4.  Use CHG as you would any other liquid soap.  You can apply chg directly  to the skin and wash gently with scrungie or a clean washcloth.  5.  Apply the CHG Soap to your body ONLY FROM THE NECK DOWN.  Do not use on open wounds or open sores.  Avoid contact with your eyes ears, mouth and genitals (private parts).  Wash genitals (private parts)       with your normal soap.  6.  Wash thoroughly, paying special attention to the area where your surgery will be performed.  7.  Thoroughly rinse your body with warm water from the neck down.  8.  DO NOT shower/wash with your normal soap after using and rinsing off the CHG Soap.  9.  Pat yourself dry with a clean towel.            10.  Wear clean pajamas.            11.  Place clean sheets on your bed the night of your first shower and do not sleep with pets.  Day of Surgery  Do not apply any lotions/deodorants the morning of surgery.  Please wear clean clothes to the hospital/surgery center.  Please read over the following fact sheets that you were given. Pain Booklet, Coughing and Deep Breathing, Blood Transfusion Information, Open Heart Packet, MRSA Information and Surgical Site Infection Prevention

## 2014-10-14 NOTE — Progress Notes (Signed)
[address]       [place] [address]             [phone]     CARDIOTHORACIC SURGERY OFFICE NOTE  Referring Provider is Carlena Bjornstad, MD PCP is Scarlette Calico, MD   HPI:  Patient returns to the office today for follow-up of severe three-vessel coronary artery disease, moderate aortic stenosis, and atrial fibrillation. He was last seen here in our office on 09/16/2014 at which time we made tentative plans for surgery. The patient now returns for follow-up with plans to proceed with elective aortic valve replacement, coronary artery bypass grafting, and possible Maze procedure on Wednesday, 10/16/2014. The patient reports no new problems or complaints over the last 4 weeks.    Current Outpatient Prescriptions  Medication Sig Dispense Refill  . Aflibercept 2 MG/0.05ML SOLN Inject into the eye as directed. Injection into right eye every 4 weeks    . albuterol (PROVENTIL HFA;VENTOLIN HFA) 108 (90 BASE) MCG/ACT inhaler Inhale 2 puffs into the lungs every 6 (six) hours as needed for wheezing or shortness of breath.    Marland Kitchen amiodarone (PACERONE) 200 MG tablet Take 1 tablet (200 mg total) by mouth 2 (two) times daily. 30 tablet 0  . aspirin EC 81 MG tablet Take 1 tablet (81 mg total) by mouth daily. 90 tablet 3  . atenolol (TENORMIN) 50 MG tablet Take 25 mg by mouth daily. Taking one half tablet (25 mg) by mouth daily    . B-D INS SYRINGE 0.5CC/30GX1/2" 30G X 1/2" 0.5 ML MISC USE AS DIRECTED. 200 each PRN  . Blood Glucose Monitoring Suppl (TRUETRACK BLOOD GLUCOSE) W/DEVICE KIT Test up to TID Dx: E11.39 1 each 2  . glucose blood (TRUETRACK TEST) test strip Test up to TID Dx:E11.39 100 each 12  . hydrochlorothiazide (HYDRODIURIL) 25 MG tablet Take 25 mg by mouth daily.    . Insulin Isophane & Regular Human (HUMULIN 70/30 KWIKPEN) (70-30) 100 UNIT/ML PEN Inject 30 Units into the skin 2 (two) times daily with a meal. 15 mL 11  . lisinopril (PRINIVIL,ZESTRIL) 20 MG tablet  Take 20 mg by mouth daily.    . metFORMIN (GLUCOPHAGE) 1000 MG tablet Take 1,000 mg by mouth 2 (two) times daily with a meal.    . minocycline (MINOCIN,DYNACIN) 100 MG capsule Take 100 mg by mouth 3 (three) times a week.     . nitroGLYCERIN (NITROSTAT) 0.4 MG SL tablet Place 1 tablet (0.4 mg total) under the tongue every 5 (five) minutes as needed for chest pain. 50 tablet 3  . Respiratory Therapy Supplies (FLUTTER) DEVI Use as directed 1 each 0   No current facility-administered medications for this visit.      Physical Exam:   BP 140/54 mmHg  Pulse 60  Resp 20  Ht 6' 2" (1.88 m)  Wt 280 lb (127.007 kg)  BMI 35.93 kg/m2  SpO2 93%  General:  Obese but well appearing  Chest:   Clear with a few scattered rhonchi  CV:   Regular rate and rhythm with prominent systolic murmur  Incisions:  n/a  Abdomen:  Soft and nontender  Extremities:  Warm and well-perfused  Diagnostic Tests:  CHEST 2 VIEW  COMPARISON: CT 08/12/2014  FINDINGS: Mild increased interstitial markings. Peribronchial thickening. Findings likely reflect bronchitis. No confluent opacities or effusions. Heart is normal size. No acute bony abnormality.  IMPRESSION: Probable bronchitic changes.   Electronically Signed  By: Rolm Baptise M.D.  On: 10/14/2014 14:29  Results for Charles [name], Charles [name] (MRN [id number]) as of 10/14/2014 15:35  Ref. Range 10/14/2014 13:36  Sample type Unknown ARTERIAL DRAW  Delivery systems Unknown ROOM AIR  FIO2 Unknown 0.21  pH, Arterial Latest Ref Range: 7.350-7.450  7.471 (H)  pCO2 arterial Latest Ref Range: 35.0-45.0 mmHg 36.3  pO2, Arterial Latest Ref Range: 80.0-100.0 mmHg 53.0 (L)  Bicarbonate Latest Ref Range: 20.0-24.0 mEq/L 26.2 (H)  TCO2 Latest Ref Range: 0-100 mmol/L 27.3  Acid-Base Excess Latest Ref Range: 0.0-2.0 mmol/L 2.7 (H)  O2 Saturation Latest Units: % 87.3  Patient temperature Unknown 98.6     Impression:  Patient has severe three-vessel coronary  artery disease with moderate aortic stenosis. I have personally reviewed the patient's recent transthoracic echocardiogram, diagnostic cardiac catheterization, CT angiogram, PFT's and physical therapy evaluation. Echocardiogram confirms the presence of severe thickening and restricted leaflet mobility involving all 3 leaflets of the aortic valve. Peak velocity across aortic valve measures approximately 3.2 m/s corresponding to mean transvalvular gradient of 25 mmHg. Left ventricular systolic function appears to be at least mild to moderately reduced. Cardiac catheterization reveals the presence of severe three-vessel coronary artery disease with coronary anatomy relatively unfavorable for percutaneous coronary intervention in this patient with long-standing diabetes mellitus. CT angiogram of the chest is notable for the absence of pulmonary embolus.  Pulmonary function tests confirm the presence of moderate COPD. Diffusion capacity is moderate to severely reduced at baseline, and the patient has baseline hypoxemia with oxygen saturation only 87% on room air. I feel that this gentleman would best be treated with a combination of surgical revascularization and aortic valve replacement, but there is no question that risks associated with conventional surgery will be very high. I am most concerned by the patient's risks for the development of chronic respiratory failure. He might benefit from concomitant Maze procedure at the time of high risk aortic valve replacement and coronary artery bypass grafting.    Plan:  I have again reviewed the indications, risks, potential benefits of surgery with the patient in the office this afternoon.   Alternative treatment strategies were discussed. They understand and accept all potential risks of surgery including but not limited to risk of death, stroke or other neurologic complication, myocardial infarction, congestive heart failure, respiratory failure, renal  failure, bleeding requiring transfusion and/or reexploration, arrhythmia, infection or other wound complications, pneumonia, pleural and/or pericardial effusion, pulmonary embolus, aortic dissection or other major vascular complication, or delayed complications related to valve repair or replacement including but not limited to structural valve deterioration and failure, thrombosis, embolization, endocarditis, paravalvular leak, or late recurrence of symptomatic ischemic heart disease.  The relative risks and benefits of performing a maze procedure at the time of their surgery was discussed at length, including the expected likelihood of long term freedom from recurrent symptomatic atrial fibrillation and/or atrial flutter.  The fact that the patient lives alone and does not have family members close by his been discussed including the likelihood that he will need at least short-term placement and some type of facility for rehabilitation when he leaves the hospital. All of his questions have been answered.  We plan for surgery on Wednesday, 10/16/2014. The patient has been instructed to stop taking metformin. On the morning of surgery he will take only atenolol with a sip of water.   I spent in excess of 20 minutes during the conduct of this office consultation and >50% of this time involved direct face-to-face encounter with the patient for counseling  and/or coordination of their care.    Valentina Gu. Roxy Manns, MD 10/14/2014 3:33 PM

## 2014-10-15 LAB — HEMOGLOBIN A1C
Hgb A1c MFr Bld: 8.9 % — ABNORMAL HIGH (ref 4.8–5.6)
Mean Plasma Glucose: 209 mg/dL

## 2014-10-15 MED ORDER — VANCOMYCIN HCL 10 G IV SOLR
1500.0000 mg | INTRAVENOUS | Status: AC
  Administered 2014-10-16: 1500 mg via INTRAVENOUS
  Filled 2014-10-15: qty 1500

## 2014-10-15 MED ORDER — PHENYLEPHRINE HCL 10 MG/ML IJ SOLN
30.0000 ug/min | INTRAVENOUS | Status: AC
  Administered 2014-10-16: 25 ug/min via INTRAVENOUS
  Filled 2014-10-15: qty 2

## 2014-10-15 MED ORDER — POTASSIUM CHLORIDE 2 MEQ/ML IV SOLN
80.0000 meq | INTRAVENOUS | Status: DC
  Filled 2014-10-15: qty 40

## 2014-10-15 MED ORDER — METOPROLOL TARTRATE 12.5 MG HALF TABLET: 12.5000 mg | ORAL_TABLET | ORAL | Status: DC

## 2014-10-15 MED ORDER — NITROGLYCERIN IN D5W 200-5 MCG/ML-% IV SOLN
2.0000 ug/min | INTRAVENOUS | Status: DC
  Filled 2014-10-15: qty 250

## 2014-10-15 MED ORDER — DOPAMINE-DEXTROSE 3.2-5 MG/ML-% IV SOLN
0.0000 ug/kg/min | INTRAVENOUS | Status: AC
  Administered 2014-10-16: 3 ug/kg/min via INTRAVENOUS
  Filled 2014-10-15: qty 250

## 2014-10-15 MED ORDER — DEXTROSE 5 % IV SOLN
750.0000 mg | INTRAVENOUS | Status: DC
  Filled 2014-10-15: qty 750

## 2014-10-15 MED ORDER — DEXMEDETOMIDINE HCL IN NACL 400 MCG/100ML IV SOLN
0.1000 ug/kg/h | INTRAVENOUS | Status: AC
  Administered 2014-10-16: .3 ug/kg/h via INTRAVENOUS
  Filled 2014-10-15: qty 100

## 2014-10-15 MED ORDER — MAGNESIUM SULFATE 50 % IJ SOLN
40.0000 meq | INTRAMUSCULAR | Status: DC
  Filled 2014-10-15: qty 10

## 2014-10-15 MED ORDER — SODIUM CHLORIDE 0.9 % IV SOLN
INTRAVENOUS | Status: AC
  Administered 2014-10-16: 69.8 mL/h via INTRAVENOUS
  Filled 2014-10-15: qty 40

## 2014-10-15 MED ORDER — DEXTROSE 5 % IV SOLN
1.5000 g | INTRAVENOUS | Status: AC
  Administered 2014-10-16: 1.5 g via INTRAVENOUS
  Administered 2014-10-16: .75 g via INTRAVENOUS
  Filled 2014-10-15: qty 1.5

## 2014-10-15 MED ORDER — VANCOMYCIN HCL 1000 MG IV SOLR
INTRAVENOUS | Status: AC
  Administered 2014-10-16: 1000 mL
  Filled 2014-10-15: qty 1000

## 2014-10-15 MED ORDER — SODIUM CHLORIDE 0.9 % IV SOLN
INTRAVENOUS | Status: AC
  Administered 2014-10-16: .9 [IU]/h via INTRAVENOUS
  Filled 2014-10-15: qty 2.5

## 2014-10-15 MED ORDER — PLASMA-LYTE 148 IV SOLN
INTRAVENOUS | Status: AC
  Administered 2014-10-16: 500 mL
  Filled 2014-10-15: qty 2.5

## 2014-10-15 MED ORDER — EPINEPHRINE HCL 1 MG/ML IJ SOLN
0.0000 ug/min | INTRAMUSCULAR | Status: DC
  Filled 2014-10-15: qty 4

## 2014-10-15 MED ORDER — HEPARIN SODIUM (PORCINE) 1000 UNIT/ML IJ SOLN
INTRAMUSCULAR | Status: DC
  Filled 2014-10-15: qty 30

## 2014-10-15 NOTE — Progress Notes (Signed)
Anesthesia Chart Review: Patient is a 74 year old male scheduled for AVR, CABG, [place] on 10/16/14 by Dr. Cornelius Moras.   History includes former smoker, CAD, chronic combined CHF, afib (refused warfarin in the past due to GI bleed), AS, COPD Gold II, OSA, HLD, DM2, HTN, anemia, severe visual impairment, CVA '97, gallstone pancreatitis s/p cholecystectomy, tonsillectomy, incidental pulmonary nodule 07/2014. BMI is consistent with obesity. PCP is Dr. Sanda Linger. Pulmonologist is Dr. Wert/Dr. Delton Coombes. Cardiologist is Dr. Myrtis Ser.   07/18/14 Cardiac cath: CONCLUSIONS:  SEVERE STENOSIS OF THE MID-LAD  MODERATE DIFFUSE STENOSIS OF THE MID-RCA  MILD-MODERATE STENOSIS OF THE OSTIUM OF THE LCX  MILD DISTAL LEFT MAIN STENOSIS  MODERATE AORTIC STENOSIS  ELEVATED LVEDP  07/15/14 Echo: Study Conclusions - Left ventricle: The cavity size was normal. Wall thickness was increased in a pattern of mild LVH. Systolic function was normal. The estimated ejection fraction was in the range of 55% to 60%. Wall motion was normal; there were no regional wall motion abnormalities. - Aortic valve: Mildly calcified leaflets. Moderate aortic stenosis. There was mild regurgitation. Mean gradient (S): 21 mm Hg. Peak gradient (S): 43 mm Hg. Valve area (VTI): 1.41 cm^2. - Mitral valve: Calcified annulus. - Left atrium: Severely dilated at 48 ml/m2. - Right atrium: Severely dilated at 30 cm2. - Tricuspid valve: There was mild regurgitation. - Pulmonary arteries: PA peak pressure: 37 mm Hg (S). - Inferior vena cava: The vessel was normal in size. The respirophasic diameter changes were in the normal range (>= 50%), consistent with normal central venous pressure.  10/14/14 Preliminary carotid duplex results showed 1-39% BICA stenosis, antegrade vertebral flow.   Pulmonary Function Tests  08/12/14 BaselinePost-bronchodilator FVC2.83 L (59% predicted)FVC3.09 L (65% predicted) FEV11.72 L (50% predicted)FEV11.99 L (57% predicted) FEF25-750.71 L (28% predicted)FEF25-751.27 L (51% predicted) TLC5.77 L (75% predicted) RV3.10 L (113% predicted) DLCO43% predicted  Per Dr. Thurston Hole 09/01/14 note: "This is clearly not prohibitive to non-lung surgery and note the nodules may be tempting to address while the chest is open and could tolerate an excisional bx but I'm not really enthusiastic about this approach as the risk benefit is less clear and will defer this to Dr Clarence's judgement. He would benefit from early mobilization /IS/ minimization of narcs and periop duoneb but is cleared for surgery unless flare occurs in interim. Discussed in detail all the indications, usual Pulmonary risks and alternatives(no medical rx for AS available) relative to the benefits with patient who agrees to proceed with surgery as planned."   Preoperative EKG, CXR, labs noted. Cr 1.47, previously 1.28-1.48 this year. Non-fasting glucose 333, A1C 8.9 (consistent with eAG 209). Labs are marked as reviewed by Dr. Cornelius Moras. He will get a fasting CBG on arrival.   [facility] Stay [facility]/Anesthesiology Phone [phone] 10/15/2014 10:23 AM

## 2014-10-15 NOTE — H&P (Signed)
[address]       [place] [address]             [phone]          CARDIOTHORACIC SURGERY HISTORY AND PHYSICAL EXAM  Referring Provider is Carlena Bjornstad, MD PCP is Scarlette Calico, MD  Chief Complaint  Patient presents with  . Aortic Stenosis    Surgical eval, Cardiac Cath 07/18/14, ECHO 07/15/14    HPI:  Patient is a 74 year old morbidly obese white male with history of aortic stenosis, coronary artery disease, chronic combined systolic and diastolic congestive heart failure, chronic persistent atrial fibrillation, previous stroke, COPD, and obstructive sleep apnea who has been referred for surgical consultation to discuss treatment options for management of aortic stenosis and multivessel coronary artery disease. The patient's history dates back to 1997 when he suffered an acute hemispheric stroke. At the time the patient lived in [place] where he was hospitalized for 2 weeks and subsequently followed for several years. He moved to [place] approximately 5 years ago and has been followed chronically by Dr. Ron Parker, Dr. Lamonte Sakai, and Dr. Ronnald Ramp with multiple medical problems. The patient describes a more than 15 year history of progressive symptoms of exertional shortness of breath consistent with chronic combined systolic and diastolic congestive heart failure that is further exacerbated by the presence of underlying chronic lung disease, morbid obesity, and physical deconditioning. The patient was diagnosed with atrial fibrillation in 2012. He was initially anticoagulated using Xarelto but this was discontinued following an episode of gross hematuria. Transthoracic echocardiograms have demonstrated a gradual progression of aortic stenosis and mild left ventricular systolic dysfunction. Echocardiogram performed July 2014 revealed findings consistent with moderate aortic stenosis and ejection fraction estimated 55-60%. Peak velocity across the aortic valve  was measured 3.1 m/s at that time, corresponding to a mean transvalvular gradient of 25 mmHg. For a while the patient participated in the outpatient pulmonary rehabilitation program in the joint and some degree of symptomatic improvement. He's had some progression of symptoms of exertional shortness of breath, and 3 months ago he experienced a prolonged episode of chest pain occurring at rest. The patient states that he had tightness across his chest that prolonged for several hours. He ultimately took aspirin and his symptoms resolved. He was seen in follow-up by his primary care physician and a nuclear stress test was ordered. This revealed small scarring at the base of the inferior and inferolateral wall with moderate peri-infarct ischemia and resting ejection fraction estimated 39%. The patient was referred back to Dr. Ron Parker for follow-up. Transthoracic echocardiogram revealed ejection fraction estimated 55% with peak velocity across the aortic valve approximately 3.3 m/s corresponding to peak and mean transvalvular gradients of 43 and 21 mmHg, respectively. Left heart catheterization revealed severe three-vessel coronary artery disease with moderate aortic stenosis and elevated left ventricular filling pressures. Mean transvalvular gradient was reported 23.4 mmHg. Ejection fraction was estimated 45-50%. Right heart catheterization was not performed. Cardiothoracic surgical consultation was requested.  Patient is single, has never been married, has no children, and has no close remaining family members. He lives alone in [place]. He has been retired for many years having previously worked in [place], and in [place]. He lives a very sedentary lifestyle. He plays guitar and enjoys using him radio. He is severely limited by poor eyesight and stop driving an automobile last October. His mobility is limited primarily because of poor eye sight with considerable instability  of gait.  He uses a cane to help him walk. He quit the pulmonary rehabilitation program several months ago. He describes a 15 year history of progressive exertional shortness of breath. He gets short of breath with very mild activity in this substantially limits his physical mobility and daily activities. He denies any history of resting shortness of breath, PND, orthopnea, dizzy spells, or syncope. He has had some chronic lower extremity edema. He had a single prolonged episode of chest pain proximally 3 months ago. He has not had any chest discomfort recently. He was initially diagnosed with atrial fibrillation in 2012. He has refused long-term anticoagulation therapy because of concerns for bleeding. He reports a long-standing history of bleeding diathesis.  Patient returns to the office today for follow-up of severe three-vessel coronary artery disease, moderate aortic stenosis, and atrial fibrillation. He was originally seen in consultation 08/02/2014 and last seen here in our office on 08/26/2014. At that time we referred him back to see Dr. Lamonte Sakai who has cared for him in the past. The patient apparently had multiple previous CT scans of the chest and PET scan's performed when he lived in [place] documenting the presence of chronic pulmonary nodules. These scans have been given to Dr. Lamonte Sakai the past. Recent CT scan of the chest performed locally demonstrated the presence of several pulmonary nodules, and the patient's old scans were not available for comparison. The patient was referred back to see Dr. Lamonte Sakai to comment upon the nodules and whether or not PET scan might be appropriate. In addition, we had hoped to glean input from Dr. Lamonte Sakai regarding the patient's underlying pulmonary risk related to surgery and whether or not there might be benefit to preoperative adjustment of his medical therapy for COPD. The patient was evaluated by Dr. Melvyn Novas on 08/29/2014. The patient was cleared for surgery. No comments  were made regarding the patient's pulmonary nodules. The patient returns to our office today for follow-up with hopes to proceed with surgery in the near future.  Patient returns to the office today for follow-up of severe three-vessel coronary artery disease, moderate aortic stenosis, and atrial fibrillation. He was last seen here in our office on 09/16/2014 at which time we made tentative plans for surgery. The patient now returns for follow-up with plans to proceed with elective aortic valve replacement, coronary artery bypass grafting, and possible Maze procedure on Wednesday, 10/16/2014. The patient reports no new problems or complaints over the last 4 weeks.        Past Medical History  Diagnosis Date  . Morbid obesity   . Diverticulosis of colon   . Acute pancreatitis     Gallstone pancreatitis  . COPD (chronic obstructive pulmonary disease)     Dr Lamonte Sakai  . OSA (obstructive sleep apnea)     Dr. Lamonte Sakai  . CAD (coronary artery disease)   . Cataract   . Nonexudative senile macular degeneration of retina   . Solitary pulmonary nodule     Dr. Lamonte Sakai    . Hyperlipidemia   . Osteoarthritis   . Anemia   . Diabetes mellitus, type 2   . Hypertension   . Aortic stenosis   . Atrial fibrillation     New diagnosis August 11, 2011  . DOE (dyspnea on exertion)   . Edema   . Chronic combined systolic and diastolic congestive heart failure   . Visual impairment     severe  . Physical deconditioning   . Limited mobility   .  Incidental pulmonary nodule, greater than or equal to 62m 08/12/2014    Right lung - reportedly seen on [place]  . Dysrhythmia     atrial fib  . Heart murmur   . Stroke 1997    numbness in left hand  . Asthma     child    Past Surgical History  Procedure Laterality Date  . Cholecystectomy    . Tonsillectomy    . Cardiac catheterization N/A 07/18/2014    Procedure: Left Heart Cath and Coronary Angiography;  Surgeon: MSherren Mocha MD;  [facility];  Service: Cardiovascular;  Laterality: N/A;  . Eye surgery Bilateral     Family History  Problem Relation Age of Onset  . Cancer Father     metastatic  . Heart failure Father   . Heart disease Mother   . Heart attack Mother   . Hypertension Mother   . Diabetes Mother     Social History Social History  Substance Use Topics  . Smoking status: Former Smoker -- 3.00 packs/day for 35 years    Types: Cigarettes    Quit date: 03/01/1990  . Smokeless tobacco: Never Used  . Alcohol Use: Yes     Comment: occ rare    Prior to Admission medications   Medication Sig Start Date End Date Taking? Authorizing Provider  Aflibercept 2 MG/0.05ML SOLN Inject into the eye as directed. Injection into right eye every 4 weeks   Yes Historical Provider, MD  albuterol (PROVENTIL HFA;VENTOLIN HFA) 108 (90 BASE) MCG/ACT inhaler Inhale 2 puffs into the lungs every 6 (six) hours as needed for wheezing or shortness of breath.   Yes Historical Provider, MD  amiodarone (PACERONE) 200 MG tablet Take 1 tablet (200 mg total) by mouth 2 (two) times daily. 09/16/14  Yes CRexene Alberts MD  aspirin EC 81 MG tablet Take 1 tablet (81 mg total) by mouth daily. 07/01/14  Yes TJanith Lima MD  atenolol (TENORMIN) 50 MG tablet Take 25 mg by mouth daily. Taking one half tablet (25 mg) by mouth daily   Yes Historical Provider, MD  hydrochlorothiazide (HYDRODIURIL) 25 MG tablet Take 25 mg by mouth daily.   Yes Historical Provider, MD  Insulin Isophane & Regular Human (HUMULIN 70/30 KWIKPEN) (70-30) 100 UNIT/ML PEN Inject 30 Units into the skin 2 (two) times daily with a meal. 03/11/14  Yes TJanith Lima MD  lisinopril (PRINIVIL,ZESTRIL) 20 MG tablet Take 20 mg by mouth daily.   Yes Historical Provider, MD  metFORMIN (GLUCOPHAGE) 1000 MG tablet Take 1,000 mg by mouth 2 (two) times daily with a meal.   Yes Historical Provider, MD  minocycline (MINOCIN,DYNACIN) 100 MG capsule Take 100 mg  by mouth 3 (three) times a week.  05/31/14  Yes Historical Provider, MD  nitroGLYCERIN (NITROSTAT) 0.4 MG SL tablet Place 1 tablet (0.4 mg total) under the tongue every 5 (five) minutes as needed for chest pain. 07/01/14  Yes TJanith Lima MD  B-D INS SYRINGE 0.5CC/30GX1/2" 30G X 1/2" 0.5 ML MISC USE AS DIRECTED. 02/04/12   MNeena Rhymes MD  Blood Glucose Monitoring Suppl ([facility] P Thompson Md PaBLOOD GLUCOSE) W/DEVICE KIT Test up to TID Dx: E11.39 12/13/13   TJanith Lima MD  glucose blood (TRUETRACK TEST) test strip Test up to TID Dx:E11.39 12/13/13   TJanith Lima MD  Respiratory Therapy Supplies (FLUTTER) DEVI Use as directed 06/21/13   RCollene Gobble MD  No Known Allergies     Review of Systems:  General:normal appetite, normal energy, no weight gain, no weight loss, no fever Cardiac:no chest pain with exertion, one episode chest pain at rest, + SOB with mild exertion, no resting SOB, no PND, no orthopnea, no palpitations, + arrhythmia, + atrial fibrillation, + LE edema, no dizzy spells, no syncope Respiratory:+ shortness of breath, no home oxygen, intermittent productive cough, no dry cough, no bronchitis, no wheezing, no hemoptysis, no asthma, no pain with inspiration or cough, + sleep apnea, + CPAP at night GI:no difficulty swallowing, no reflux, no frequent heartburn, no hiatal hernia, no abdominal pain, occasional constipation, occasional diarrhea, no hematochezia, no hematemesis, no melena GU:no dysuria, no frequency, no urinary tract infection, no hematuria, no enlarged prostate, no kidney stones, no kidney disease Vascular:no pain suggestive of claudication, no pain in feet, no leg cramps, no varicose veins, no DVT, no non-healing foot  ulcer Neuro:+ stroke, no TIA's, no seizures, no headaches, no temporary blindness one eye, no slurred speech, + peripheral neuropathy, no chronic pain, + instability of gait, no memory/cognitive dysfunction Musculoskeletal:+ arthritis, no joint swelling, no myalgias, + difficulty walking, diminished mobility  Skin:no rash, no itching, no skin infections, no pressure sores or ulcerations Psych:no anxiety, no depression, no nervousness, no unusual recent stress Eyes:+ blurry vision, no floaters, no recent vision changes, + wears glasses or contacts ENT:no hearing loss, no loose or painful teeth, partial dentures, last saw dentist April 2016 Hematologic:+ easy bruising, no abnormal bleeding, no clotting disorder, no frequent epistaxis Endocrine:+ diabetes, checks CBG's at home   Physical Exam:  BP 142/56 mmHg  Pulse 72  Resp 20  Ht 6' 2" (1.88 m)  Wt 285 lb (129.275 kg)  BMI 36.58 kg/m2  SpO2 94% General:Obese but o/w well-appearing HEENT:Unremarkable  Neck:no JVD, no bruits, no adenopathy  Chest:clear to auscultation, symmetrical breath sounds, no wheezes, no rhonchi  CV:RRR, grade III/VI crescendo/decrecendo systolic murmur best LLSB Abdomen:soft, non-tender, no masses  Extremities:warm, well-perfused, pulses not palpable at ankle, + LE  edema Rectal/GUDeferred Neuro:Grossly non-focal and symmetrical throughout Skin:Clean and dry, no rashes, no breakdown   Diagnostic Tests:  Transthoracic Echocardiography  (Report amended )  Patient:  Charles [name], Charles [name] MR #:    [id number] Study Date: 09/12/2012 Gender:   M Age:    21 Height:   188cm Weight:   136.1kg BSA:    2.41m2 Pt. Status: Room:  ATTENDING  NRosezetta SchlatterORDERING   Norins, MCharolett BumpersREFERRING  Norins, MCharolett BumpersSONOGRAPHER NCindy Hazy RDCS PERFORMING  M[facility]acarias Pontes Site 3 cc:  ------------------------------------------------------------ LV EF: 55% -  60%  ------------------------------------------------------------ Indications:   Atrial fibrillation - 427.31.  ------------------------------------------------------------ History:  PMH: Acquired from the patient and from the patient's chart. PMH: Atrial Fibrillation. Aortic Stenosis. CAD. Edema. Anemia. Dyspnea. Obstructive Sleep Apnea. COPD. Risk factors: Hypertension. Diabetes mellitus.  ------------------------------------------------------------ Study Conclusions  - Left ventricle: The cavity size was normal. Wall thickness was increased in a pattern of mild LVH. Systolic function was normal. The estimated ejection fraction was in the range of 55% to 60%. - Aortic valve: There was moderate stenosis. Mild regurgitation. Mean gradient: 259mHg (S). Peak gradient: 4032mg (S). - Mitral valve: Mild regurgitation. - Left atrium: The atrium was moderately dilated. - Atrial septum: No defect or patent foramen ovale was identified. - Pulmonary arteries: PA peak pressure: 68m69m (S). Transthoracic echocardiography. M-mode, complete 2D, spectral Doppler, and color Doppler. Height: Height: 188cm. Height:  74in. Weight:  Weight: 136.1kg. Weight: 299.4lb. Body mass index: BMI: 38.5kg/m^2. Body surface area:  BSA: 2.33m2. Blood pressure:   116/50. Patient status: Outpatient. Location:  Site 3  ------------------------------------------------------------  ------------------------------------------------------------ Left ventricle: The cavity size was normal. Wall thickness was increased in a pattern of mild LVH. Systolic function was normal. The estimated ejection fraction was in the range of 55% to 60%.  ------------------------------------------------------------ Aortic valve:  Doppler:  There was moderate stenosis. Mild regurgitation.  VTI ratio of LVOT to aortic valve: 0.28. Valve area: 1.28cm^2(VTI). Indexed valve area: 0.5cm^2/m^2 (VTI). Peak velocity ratio of LVOT to aortic valve: 0.31. Valve area: 1.41cm^2 (Vmax). Indexed valve area: 0.55cm^2/m^2 (Vmax).  Mean gradient: 257mHg (S). Peak gradient: 4090mg (S).  ------------------------------------------------------------ Mitral valve:  Doppler:  Mild regurgitation.  Peak gradient: 7mm61m (D).  ------------------------------------------------------------ Left atrium: The atrium was moderately dilated.  ------------------------------------------------------------ Atrial septum: No defect or patent foramen ovale was identified.  ------------------------------------------------------------ Right ventricle: The cavity size was normal. Wall thickness was normal. Systolic function was normal.  ------------------------------------------------------------ Pulmonic valve:  Doppler:  Mild regurgitation.  ------------------------------------------------------------ Tricuspid valve:  Doppler:  Mild regurgitation.  ------------------------------------------------------------ Right atrium: The atrium was normal in  size.  ------------------------------------------------------------ Pericardium: The pericardium was normal in appearance.  ------------------------------------------------------------  2D measurements    Normal Doppler measurements  Normal Left ventricle         Main pulmonary LVID ED,  50.1 mm   43-52  artery chord,             Pressure,  36 mm Hg =30 PLAX              S LVID ES,   33 mm   23-38  Left ventricle chord,             Ea, lat   13 cm/s  ------ PLAX              ann, tiss FS, chord,  34 %   >29   DP PLAX              E/Ea, lat 9.85    ------ LVPW, ED  12.2 mm   ------ ann, tiss IVS/LVPW  0.98    <1.3  DP ratio, ED           Ea, med  9.98 cm/s  ------ Ventricular septum       ann, tiss IVS, ED  11.9 mm   ------ DP LVOT              E/Ea, med 12.8    ------ Diam, S   24 mm   ------ ann, tiss   3 Area    4.52 cm^2  ------ DP Diam     24 mm   ------ LVOT Aorta             Peak vel, 98.5 cm/s  ------ Root diam,  33 mm   ------ S ED               VTI, S   23.9 cm   ------ Left atrium          Stroke vol 108. ml   ------ AP dim    46 mm   ------        1 AP dim   1.78 cm/m^2 <2.2  Stroke   41.9 ml/m^2 ------ index             index  Aortic valve                Peak vel,  316 cm/s  ------                S                Mean vel,  235 cm/s  ------                S                VTI, S   84.5 cm   ------                Mean     25 mm Hg ------                gradient,                S                Peak     40 mm Hg  ------                gradient,                S                VTI ratio 0.28    ------                LVOT/AV                Area, VTI 1.28 cm^2  ------                Area index 0.5 cm^2/m ------                (VTI)      ^2                Peak vel  0.31    ------                ratio,                LVOT/AV                Area, Vmax 1.41 cm^2  ------                Area index 0.55 cm^2/m ------                (Vmax)     ^2                Regurg PHT 561 ms   ------                Mitral valve                Peak E vel 128 cm/s  ------                Decelerati 176 ms   150-23                on time        0                Peak     7 mm Hg ------                gradient,                D  Max regurg 464 cm/s  ------                vel                Tricuspid valve                Regurg   278 cm/s  ------                peak vel                Peak RV-RA  31 mm Hg ------                gradient,                S                Systemic veins                Estimated   5 mm Hg ------                CVP                Right ventricle                Pressure,  36 mm Hg <30                S                Sa vel,  15.3 cm/s  ------                lat ann,                tiss  DP  ------------------------------------------------------------ Mortimer Fries, Peter 2014-07-15T16:[id number]    CARDIAC CATHETERIZATION Conclusion        CONCLUSIONS:  SEVERE STENOSIS OF THE MID-LAD  MODERATE DIFFUSE STENOSIS OF THE MID-RCA  MILD-MODERATE STENOSIS OF THE OSTIUM OF THE LCX  MILD DISTAL LEFT MAIN STENOSIS  MODERATE AORTIC STENOSIS  ELEVATED LVEDP  RECOMMENDATIONS: NEED TO REVIEW OPTIONS WITH PATIENT AND WILL DISCUSS FINDINGS WITH DR Ron Parker. THE PATIENT'S PRIMARY PROBLEM SEEMS TO BE SHORTNESS OF BREATH. HEMODYNAMICS ARE SUGGESTIVE OF MODERATE AORTIC STENOSIS AND DIASTOLIC HEART FAILURE WITH ELEVATED LVEDP. WILL INCREASE DIURETICS AND ARRANGE CLOSE OUTPATIENT FOLLOW-UP.   CONSIDERATIONS INCLUDE PCI OF THE LAD AND FFR OF THE RCA WITH CONTINUED OBSERVATION OF AORTIC STENOSIS VERSUS AVR/CABG.    Coronary Findings    Dominance: Right   Left Main   . LM lesion, 40% stenosed. There is a 30-40% stenosis at the distal left mainstem     Left Anterior Descending   . Prox LAD lesion, 30% stenosed. calcified .   Marland Kitchen Mid LAD lesion, 90% stenosed. calcified, diffuse .     Left Circumflex  There is mildthe vessel.   Colon Flattery Cx lesion, 50% stenosed. calcified .   Marland Kitchen First Obtuse Marginal Branch   The vessel is small in size.   Marland Kitchen Second Obtuse Marginal Branch   The vessel is small in size.   . Third Obtuse Marginal Branch   The vessel is large in size. There is mild.     Right Coronary Artery   . Mid RCA lesion, 50% stenosed. calcified, diffuse, eccentric .   Marland Kitchen Right Posterior Descending Artery   . RPDA lesion, 50% stenosed.      Wall Motion  Left Heart    Left Ventricle There is mild left ventricular systolic dysfunction. The left ventricular ejection fraction is 45-50% by visual estimate. There are wall motion abnormalities in the left ventricle. There are segmental wall motion abnormalities in the left ventricle.   Aortic  Valve There is moderate aortic valve stenosis. The aortic valve is calcified. There appears to be moderate aortic stenosis. The aortic valve is calcified and difficult to cross. The mean transvalvular gradient is 23 mmHg.    Coronary Diagrams    Diagnostic Diagram            Implants    Name ID Temporary Type Supply   No information to display    Hemo Data       Most Recent Value   Aortic Mean Gradient  23.4 mmHg   Aortic Peak Gradient  19 mmHg   AO Systolic Pressure  945 mmHg   AO Diastolic Pressure  59 mmHg   AO Mean  91 mmHg   LV Systolic Pressure  859 mmHg   LV Diastolic Pressure  19 mmHg   LV EDP  26 mmHg   Arterial Occlusion Pressure Extended Systolic Pressure  292 mmHg   Arterial Occlusion Pressure Extended Diastolic Pressure  74 mmHg   Arterial Occlusion Pressure Extended Mean Pressure  104 mmHg   Left Ventricular Apex Extended Systolic Pressure  446 mmHg   Left Ventricular Apex Extended Diastolic Pressure  11 mmHg   Left Ventricular Apex Extended EDP Pressure  27 mmHg      CT ANGIOGRAPHY CHEST WITH CONTRAST  TECHNIQUE: Multidetector CT imaging of the chest was performed using the standard protocol during bolus administration of intravenous contrast. Multiplanar CT image reconstructions and MIPs were obtained to evaluate the vascular anatomy.  CONTRAST: 28m OMNIPAQUE IOHEXOL 350 MG/ML SOLN  COMPARISON: Chest radiograph October 07, 2011  FINDINGS: There is no demonstrable pulmonary embolus. There is atherosclerotic change in the aorta without demonstrable aneurysmal dilatation.  There are multiple foci of coronary artery calcification.  There is focal opacity abutting the pleura in the posterior segment of the right lower lobe. This area represents either focal atelectasis or early pneumonia. Atelectatic changes also noted to a lesser degree in the left base.  There is a nodular opacity in the posterior segment of the  right upper lobe measuring 1.7 x 1.8 x 1.6 cm with peripheral calcification. On axial slice 22 series 6, there is a focal nodular opacity measuring 9 x 8 mm with a slightly irregular contour. No other nodular type opacities are appreciable.  There are multiple small mediastinal lymph nodes, but by size criteria there is no adenopathy. Thyroid appears normal. The pericardium is not thickened.  Visualized upper abdominal structures appear normal except for atherosclerotic change.  There is degenerative change in the thoracic spine. There are no blastic or lytic bone lesions.  Review of the MIP images confirms the above findings.  IMPRESSION: No demonstrable pulmonary embolus.  Small area of either atelectasis or rather minimal pneumonia in the posterior right base.  There is a nodular opacity in each upper lobe, larger on the right than on the left. There is peripheral but not central calcification the lesion on the right. This nodular opacity on the right is present on prior chest radiograph and is not convincingly changed. The nodular opacity on the left is not appreciable on prior chest radiograph. A followup chest CT in 10-12 weeks is advised to further evaluate, particularly given the slightly irregular contour  of the opacity on the left.  There are multiple small lymph nodes but no frank adenopathy by size criteria.  Extensive coronary artery calcification. There is calcification in the aorta without aneurysm.   Electronically Signed  By: Lowella Grip III M.D.  On: 08/12/2014 10:05   Pulmonary Function Tests  BaselinePost-bronchodilator  FVC2.83 L (59% predicted)FVC3.09 L (65% predicted) FEV11.72 L (50% predicted)FEV11.99 L (57% predicted) FEF25-750.71 L (28%  predicted)FEF25-751.27 L (51% predicted)  TLC5.77 L (75% predicted) RV3.10 L (113% predicted) DLCO43% predicted     Impression:  Patient has severe three-vessel coronary artery disease with moderate aortic stenosis. I have personally reviewed the patient's recent transthoracic echocardiogram, diagnostic cardiac catheterization, CT angiogram, PFT's and physical therapy evaluation. Echocardiogram confirms the presence of severe thickening and restricted leaflet mobility involving all 3 leaflets of the aortic valve. Peak velocity across aortic valve measures approximately 3.2 m/s corresponding to mean transvalvular gradient of 25 mmHg. Left ventricular systolic function appears to be at least mild to moderately reduced. Cardiac catheterization reveals the presence of severe three-vessel coronary artery disease with coronary anatomy relatively unfavorable for percutaneous coronary intervention in this patient with long-standing diabetes mellitus. CT angiogram of the chest is notable for the absence of pulmonary embolus.  Pulmonary function tests confirm the presence of moderate COPD. Diffusion capacity is moderate to severely reduced at baseline, and the patient has baseline hypoxemia with oxygen saturation only 87% on room air. I feel that this gentleman would best be treated with a combination of surgical revascularization and aortic valve replacement, but there is no question that risks associated with conventional surgery will be very high. I am most concerned by the patient's risks for the development of respiratory failure. He might benefit from concomitant Maze procedure at the time of high risk aortic valve replacement and coronary artery bypass grafting.    Plan:  I have again reviewed the indications, risks, potential benefits of surgery with the patient in the office this afternoon. Alternative treatment  strategies were discussed. They understand and accept all potential risks of surgery including but not limited to risk of death, stroke or other neurologic complication, myocardial infarction, congestive heart failure, respiratory failure, renal failure, bleeding requiring transfusion and/or reexploration, arrhythmia, infection or other wound complications, pneumonia, pleural and/or pericardial effusion, pulmonary embolus, aortic dissection or other major vascular complication, or delayed complications related to valve repair or replacement including but not limited to structural valve deterioration and failure, thrombosis, embolization, endocarditis, paravalvular leak, or late recurrence of symptomatic ischemic heart disease. The relative risks and benefits of performing a maze procedure at the time of their surgery was discussed at length, including the expected likelihood of long term freedom from recurrent symptomatic atrial fibrillation and/or atrial flutter. The fact that the patient lives alone and does not have family members close by his been discussed including the likelihood that he will need at least short-term placement and some type of facility for rehabilitation when he leaves the hospital. All of his questions have been answered. We plan for surgery on Wednesday, 10/16/2014. The patient has been instructed to stop taking metformin. On the morning of surgery he will take only atenolol with a sip of water.    Valentina Gu. Roxy Manns, MD 10/14/2014 3:33 PM

## 2014-10-16 ENCOUNTER — Inpatient Hospital Stay (HOSPITAL_COMMUNITY): Payer: Medicare Other

## 2014-10-16 ENCOUNTER — Other Ambulatory Visit: Payer: Self-pay

## 2014-10-16 ENCOUNTER — Inpatient Hospital Stay (HOSPITAL_COMMUNITY): Payer: Medicare Other | Admitting: Vascular Surgery

## 2014-10-16 ENCOUNTER — Encounter (HOSPITAL_COMMUNITY): Payer: Self-pay | Admitting: *Deleted

## 2014-10-16 ENCOUNTER — Inpatient Hospital Stay (HOSPITAL_COMMUNITY): Payer: Medicare Other | Admitting: Anesthesiology

## 2014-10-16 ENCOUNTER — Inpatient Hospital Stay (HOSPITAL_COMMUNITY)
Admission: RE | Admit: 2014-10-16 | Discharge: 2014-10-24 | DRG: 219 | Disposition: A | Discharge status: Hospice | Payer: Medicare Other | Source: Ambulatory Visit | Attending: Internal Medicine | Admitting: Internal Medicine

## 2014-10-16 ENCOUNTER — Encounter (HOSPITAL_COMMUNITY)
Admission: RE | Disposition: A | Discharge status: Hospice | Payer: Medicare Other | Source: Ambulatory Visit | Attending: Thoracic Surgery (Cardiothoracic Vascular Surgery)

## 2014-10-16 DIAGNOSIS — T502X5A Adverse effect of carbonic-anhydrase inhibitors, benzothiadiazides and other diuretics, initial encounter: Secondary | ICD-10-CM | POA: Diagnosis not present

## 2014-10-16 DIAGNOSIS — G936 Cerebral edema: Secondary | ICD-10-CM | POA: Diagnosis present

## 2014-10-16 DIAGNOSIS — R414 Neurologic neglect syndrome: Secondary | ICD-10-CM | POA: Diagnosis not present

## 2014-10-16 DIAGNOSIS — R911 Solitary pulmonary nodule: Secondary | ICD-10-CM | POA: Diagnosis present

## 2014-10-16 DIAGNOSIS — Z9989 Dependence on other enabling machines and devices: Secondary | ICD-10-CM

## 2014-10-16 DIAGNOSIS — G8191 Hemiplegia, unspecified affecting right dominant side: Secondary | ICD-10-CM | POA: Diagnosis not present

## 2014-10-16 DIAGNOSIS — I351 Nonrheumatic aortic (valve) insufficiency: Secondary | ICD-10-CM | POA: Diagnosis present

## 2014-10-16 DIAGNOSIS — Z87891 Personal history of nicotine dependence: Secondary | ICD-10-CM | POA: Diagnosis not present

## 2014-10-16 DIAGNOSIS — E119 Type 2 diabetes mellitus without complications: Secondary | ICD-10-CM | POA: Diagnosis present

## 2014-10-16 DIAGNOSIS — R0602 Shortness of breath: Secondary | ICD-10-CM

## 2014-10-16 DIAGNOSIS — M1990 Unspecified osteoarthritis, unspecified site: Secondary | ICD-10-CM | POA: Diagnosis present

## 2014-10-16 DIAGNOSIS — K567 Ileus, unspecified: Secondary | ICD-10-CM

## 2014-10-16 DIAGNOSIS — E876 Hypokalemia: Secondary | ICD-10-CM | POA: Diagnosis not present

## 2014-10-16 DIAGNOSIS — I340 Nonrheumatic mitral (valve) insufficiency: Secondary | ICD-10-CM | POA: Diagnosis present

## 2014-10-16 DIAGNOSIS — E662 Morbid (severe) obesity with alveolar hypoventilation: Secondary | ICD-10-CM | POA: Diagnosis present

## 2014-10-16 DIAGNOSIS — R633 Feeding difficulties, unspecified: Secondary | ICD-10-CM

## 2014-10-16 DIAGNOSIS — Z951 Presence of aortocoronary bypass graft: Secondary | ICD-10-CM

## 2014-10-16 DIAGNOSIS — I482 Chronic atrial fibrillation, unspecified: Secondary | ICD-10-CM

## 2014-10-16 DIAGNOSIS — Z7982 Long term (current) use of aspirin: Secondary | ICD-10-CM

## 2014-10-16 DIAGNOSIS — Z953 Presence of xenogenic heart valve: Secondary | ICD-10-CM

## 2014-10-16 DIAGNOSIS — N289 Disorder of kidney and ureter, unspecified: Secondary | ICD-10-CM | POA: Diagnosis present

## 2014-10-16 DIAGNOSIS — R5381 Other malaise: Secondary | ICD-10-CM | POA: Diagnosis present

## 2014-10-16 DIAGNOSIS — R4701 Aphasia: Secondary | ICD-10-CM | POA: Diagnosis not present

## 2014-10-16 DIAGNOSIS — Z515 Encounter for palliative care: Secondary | ICD-10-CM | POA: Diagnosis not present

## 2014-10-16 DIAGNOSIS — L89159 Pressure ulcer of sacral region, unspecified stage: Secondary | ICD-10-CM | POA: Diagnosis not present

## 2014-10-16 DIAGNOSIS — J95822 Acute and chronic postprocedural respiratory failure: Secondary | ICD-10-CM | POA: Diagnosis not present

## 2014-10-16 DIAGNOSIS — J984 Other disorders of lung: Secondary | ICD-10-CM | POA: Diagnosis present

## 2014-10-16 DIAGNOSIS — I350 Nonrheumatic aortic (valve) stenosis: Secondary | ICD-10-CM | POA: Diagnosis present

## 2014-10-16 DIAGNOSIS — I25110 Atherosclerotic heart disease of native coronary artery with unstable angina pectoris: Secondary | ICD-10-CM

## 2014-10-16 DIAGNOSIS — I480 Paroxysmal atrial fibrillation: Secondary | ICD-10-CM | POA: Diagnosis not present

## 2014-10-16 DIAGNOSIS — I481 Persistent atrial fibrillation: Secondary | ICD-10-CM | POA: Diagnosis present

## 2014-10-16 DIAGNOSIS — E872 Acidosis: Secondary | ICD-10-CM | POA: Diagnosis not present

## 2014-10-16 DIAGNOSIS — J449 Chronic obstructive pulmonary disease, unspecified: Secondary | ICD-10-CM | POA: Diagnosis present

## 2014-10-16 DIAGNOSIS — I10 Essential (primary) hypertension: Secondary | ICD-10-CM | POA: Diagnosis present

## 2014-10-16 DIAGNOSIS — E1139 Type 2 diabetes mellitus with other diabetic ophthalmic complication: Secondary | ICD-10-CM | POA: Diagnosis present

## 2014-10-16 DIAGNOSIS — T8111XA Postprocedural  cardiogenic shock, initial encounter: Secondary | ICD-10-CM | POA: Diagnosis not present

## 2014-10-16 DIAGNOSIS — J9811 Atelectasis: Secondary | ICD-10-CM

## 2014-10-16 DIAGNOSIS — I352 Nonrheumatic aortic (valve) stenosis with insufficiency: Secondary | ICD-10-CM | POA: Diagnosis present

## 2014-10-16 DIAGNOSIS — Z9689 Presence of other specified functional implants: Secondary | ICD-10-CM

## 2014-10-16 DIAGNOSIS — I5043 Acute on chronic combined systolic (congestive) and diastolic (congestive) heart failure: Secondary | ICD-10-CM | POA: Diagnosis not present

## 2014-10-16 DIAGNOSIS — I63412 Cerebral infarction due to embolism of left middle cerebral artery: Secondary | ICD-10-CM | POA: Diagnosis not present

## 2014-10-16 DIAGNOSIS — I2510 Atherosclerotic heart disease of native coronary artery without angina pectoris: Secondary | ICD-10-CM | POA: Diagnosis present

## 2014-10-16 DIAGNOSIS — Z8673 Personal history of transient ischemic attack (TIA), and cerebral infarction without residual deficits: Secondary | ICD-10-CM

## 2014-10-16 DIAGNOSIS — N183 Chronic kidney disease, stage 3 (moderate): Secondary | ICD-10-CM | POA: Diagnosis present

## 2014-10-16 DIAGNOSIS — Z978 Presence of other specified devices: Secondary | ICD-10-CM

## 2014-10-16 DIAGNOSIS — I6340 Cerebral infarction due to embolism of unspecified cerebral artery: Secondary | ICD-10-CM | POA: Diagnosis not present

## 2014-10-16 DIAGNOSIS — E11339 Type 2 diabetes mellitus with moderate nonproliferative diabetic retinopathy without macular edema: Secondary | ICD-10-CM

## 2014-10-16 DIAGNOSIS — I639 Cerebral infarction, unspecified: Secondary | ICD-10-CM

## 2014-10-16 DIAGNOSIS — E785 Hyperlipidemia, unspecified: Secondary | ICD-10-CM | POA: Diagnosis present

## 2014-10-16 DIAGNOSIS — I25119 Atherosclerotic heart disease of native coronary artery with unspecified angina pectoris: Secondary | ICD-10-CM

## 2014-10-16 DIAGNOSIS — I4891 Unspecified atrial fibrillation: Secondary | ICD-10-CM | POA: Diagnosis present

## 2014-10-16 DIAGNOSIS — D696 Thrombocytopenia, unspecified: Secondary | ICD-10-CM | POA: Diagnosis present

## 2014-10-16 DIAGNOSIS — Z794 Long term (current) use of insulin: Secondary | ICD-10-CM | POA: Diagnosis not present

## 2014-10-16 DIAGNOSIS — J9601 Acute respiratory failure with hypoxia: Secondary | ICD-10-CM | POA: Diagnosis not present

## 2014-10-16 DIAGNOSIS — D62 Acute posthemorrhagic anemia: Secondary | ICD-10-CM | POA: Diagnosis not present

## 2014-10-16 DIAGNOSIS — H547 Unspecified visual loss: Secondary | ICD-10-CM | POA: Diagnosis present

## 2014-10-16 DIAGNOSIS — G4733 Obstructive sleep apnea (adult) (pediatric): Secondary | ICD-10-CM | POA: Diagnosis present

## 2014-10-16 DIAGNOSIS — E66813 Obesity, class 3: Secondary | ICD-10-CM | POA: Diagnosis present

## 2014-10-16 DIAGNOSIS — L8990 Pressure ulcer of unspecified site, unspecified stage: Secondary | ICD-10-CM | POA: Diagnosis not present

## 2014-10-16 DIAGNOSIS — I5042 Chronic combined systolic (congestive) and diastolic (congestive) heart failure: Secondary | ICD-10-CM | POA: Diagnosis not present

## 2014-10-16 DIAGNOSIS — Z8679 Personal history of other diseases of the circulatory system: Secondary | ICD-10-CM

## 2014-10-16 DIAGNOSIS — Z66 Do not resuscitate: Secondary | ICD-10-CM | POA: Diagnosis present

## 2014-10-16 DIAGNOSIS — Z6838 Body mass index (BMI) 38.0-38.9, adult: Secondary | ICD-10-CM | POA: Diagnosis not present

## 2014-10-16 DIAGNOSIS — Z9889 Other specified postprocedural states: Secondary | ICD-10-CM

## 2014-10-16 DIAGNOSIS — E11319 Type 2 diabetes mellitus with unspecified diabetic retinopathy without macular edema: Secondary | ICD-10-CM | POA: Diagnosis present

## 2014-10-16 DIAGNOSIS — Z4659 Encounter for fitting and adjustment of other gastrointestinal appliance and device: Secondary | ICD-10-CM

## 2014-10-16 HISTORY — PX: MAZE: SHX5063

## 2014-10-16 HISTORY — DX: Presence of xenogenic heart valve: Z95.3

## 2014-10-16 HISTORY — DX: Presence of aortocoronary bypass graft: Z95.1

## 2014-10-16 HISTORY — PX: TEE WITHOUT CARDIOVERSION: SHX5443

## 2014-10-16 HISTORY — DX: Cerebral infarction due to embolism of unspecified cerebral artery: I63.40

## 2014-10-16 HISTORY — DX: Other specified postprocedural states: Z98.890

## 2014-10-16 HISTORY — PX: AORTIC VALVE REPLACEMENT (AVR)/CORONARY ARTERY BYPASS GRAFTING (CABG): SHX5725

## 2014-10-16 HISTORY — DX: Personal history of other diseases of the circulatory system: Z86.79

## 2014-10-16 HISTORY — PX: CLIPPING OF ATRIAL APPENDAGE: SHX5773

## 2014-10-16 HISTORY — DX: Personal history of other diseases of the circulatory system: Z98.890

## 2014-10-16 LAB — POCT I-STAT 3, ART BLOOD GAS (G3+)
ACID-BASE DEFICIT: 4 mmol/L — AB (ref 0.0–2.0)
ACID-BASE DEFICIT: 6 mmol/L — AB (ref 0.0–2.0)
ACID-BASE EXCESS: 2 mmol/L (ref 0.0–2.0)
Acid-base deficit: 7 mmol/L — ABNORMAL HIGH (ref 0.0–2.0)
BICARBONATE: 21.5 meq/L (ref 20.0–24.0)
Bicarbonate: 28.2 mEq/L — ABNORMAL HIGH (ref 20.0–24.0)
Bicarbonate: 23.3 mEq/L (ref 20.0–24.0)
Bicarbonate: 19.8 mEq/L — ABNORMAL LOW (ref 20.0–24.0)
O2 SAT: 100 %
O2 SAT: 87 %
O2 SAT: 94 %
O2 Saturation: 94 %
PCO2 ART: 52.5 mmHg — AB (ref 35.0–45.0)
PH ART: 7.348 — AB (ref 7.350–7.450)
PH ART: 7.29 — AB (ref 7.350–7.450)
PO2 ART: 83 mmHg (ref 80.0–100.0)
TCO2: 30 mmol/L (ref 0–100)
TCO2: 25 mmol/L (ref 0–100)
TCO2: 23 mmol/L (ref 0–100)
TCO2: 21 mmol/L (ref 0–100)
pCO2 arterial: 51.3 mmHg — ABNORMAL HIGH (ref 35.0–45.0)
pCO2 arterial: 52 mmHg — ABNORMAL HIGH (ref 35.0–45.0)
pCO2 arterial: 40.6 mmHg (ref 35.0–45.0)
pH, Arterial: 7.255 — ABNORMAL LOW (ref 7.350–7.450)
pH, Arterial: 7.217 — ABNORMAL LOW (ref 7.350–7.450)
pO2, Arterial: 398 mmHg — ABNORMAL HIGH (ref 80.0–100.0)
pO2, Arterial: 61 mmHg — ABNORMAL LOW (ref 80.0–100.0)
pO2, Arterial: 74 mmHg — ABNORMAL LOW (ref 80.0–100.0)

## 2014-10-16 LAB — POCT I-STAT, CHEM 8
BUN: 26 mg/dL — ABNORMAL HIGH (ref 6–20)
BUN: 25 mg/dL — AB (ref 6–20)
BUN: 24 mg/dL — ABNORMAL HIGH (ref 6–20)
BUN: 26 mg/dL — ABNORMAL HIGH (ref 6–20)
BUN: 24 mg/dL — ABNORMAL HIGH (ref 6–20)
BUN: 24 mg/dL — ABNORMAL HIGH (ref 6–20)
CALCIUM ION: 1.2 mmol/L (ref 1.13–1.30)
CALCIUM ION: 1.06 mmol/L — AB (ref 1.13–1.30)
CALCIUM ION: 1.03 mmol/L — AB (ref 1.13–1.30)
CALCIUM ION: 1 mmol/L — AB (ref 1.13–1.30)
CALCIUM ION: 1.12 mmol/L — AB (ref 1.13–1.30)
CHLORIDE: 102 mmol/L (ref 101–111)
CREATININE: 1.4 mg/dL — AB (ref 0.61–1.24)
CREATININE: 1.2 mg/dL (ref 0.61–1.24)
Calcium, Ion: 1.2 mmol/L (ref 1.13–1.30)
Chloride: 102 mmol/L (ref 101–111)
Chloride: 95 mmol/L — ABNORMAL LOW (ref 101–111)
Chloride: 102 mmol/L (ref 101–111)
Chloride: 102 mmol/L (ref 101–111)
Chloride: 102 mmol/L (ref 101–111)
Creatinine, Ser: 1.4 mg/dL — ABNORMAL HIGH (ref 0.61–1.24)
Creatinine, Ser: 1.1 mg/dL (ref 0.61–1.24)
Creatinine, Ser: 1.3 mg/dL — ABNORMAL HIGH (ref 0.61–1.24)
Creatinine, Ser: 1.1 mg/dL (ref 0.61–1.24)
GLUCOSE: 104 mg/dL — AB (ref 65–99)
GLUCOSE: 97 mg/dL (ref 65–99)
GLUCOSE: 124 mg/dL — AB (ref 65–99)
GLUCOSE: 151 mg/dL — AB (ref 65–99)
Glucose, Bld: 89 mg/dL (ref 65–99)
Glucose, Bld: 220 mg/dL — ABNORMAL HIGH (ref 65–99)
HCT: 40 % (ref 39.0–52.0)
HCT: 39 % (ref 39.0–52.0)
HCT: 33 % — ABNORMAL LOW (ref 39.0–52.0)
HCT: 30 % — ABNORMAL LOW (ref 39.0–52.0)
HCT: 27 % — ABNORMAL LOW (ref 39.0–52.0)
HEMATOCRIT: 31 % — AB (ref 39.0–52.0)
HEMOGLOBIN: 13.6 g/dL (ref 13.0–17.0)
HEMOGLOBIN: 11.2 g/dL — AB (ref 13.0–17.0)
HEMOGLOBIN: 10.2 g/dL — AB (ref 13.0–17.0)
HEMOGLOBIN: 10.5 g/dL — AB (ref 13.0–17.0)
Hemoglobin: 13.3 g/dL (ref 13.0–17.0)
Hemoglobin: 9.2 g/dL — ABNORMAL LOW (ref 13.0–17.0)
POTASSIUM: 3.8 mmol/L (ref 3.5–5.1)
Potassium: 3.5 mmol/L (ref 3.5–5.1)
Potassium: 3.3 mmol/L — ABNORMAL LOW (ref 3.5–5.1)
Potassium: 4.4 mmol/L (ref 3.5–5.1)
Potassium: 5 mmol/L (ref 3.5–5.1)
Potassium: 4.6 mmol/L (ref 3.5–5.1)
SODIUM: 137 mmol/L (ref 135–145)
SODIUM: 139 mmol/L (ref 135–145)
SODIUM: 138 mmol/L (ref 135–145)
Sodium: 141 mmol/L (ref 135–145)
Sodium: 140 mmol/L (ref 135–145)
Sodium: 138 mmol/L (ref 135–145)
TCO2: 26 mmol/L (ref 0–100)
TCO2: 28 mmol/L (ref 0–100)
TCO2: 26 mmol/L (ref 0–100)
TCO2: 27 mmol/L (ref 0–100)
TCO2: 26 mmol/L (ref 0–100)
TCO2: 22 mmol/L (ref 0–100)

## 2014-10-16 LAB — PROTIME-INR
INR: 1.89 — ABNORMAL HIGH (ref 0.00–1.49)
Prothrombin Time: 21.6 seconds — ABNORMAL HIGH (ref 11.6–15.2)

## 2014-10-16 LAB — CBC
HEMATOCRIT: 28.1 % — AB (ref 39.0–52.0)
Hemoglobin: 9.1 g/dL — ABNORMAL LOW (ref 13.0–17.0)
MCH: 30.2 pg (ref 26.0–34.0)
MCHC: 32.4 g/dL (ref 30.0–36.0)
MCV: 93.4 fL (ref 78.0–100.0)
PLATELETS: 86 10*3/uL — AB (ref 150–400)
RBC: 3.01 MIL/uL — ABNORMAL LOW (ref 4.22–5.81)
RDW: 14.2 % (ref 11.5–15.5)
WBC: 15.8 10*3/uL — AB (ref 4.0–10.5)

## 2014-10-16 LAB — APTT: aPTT: 43 seconds — ABNORMAL HIGH (ref 24–37)

## 2014-10-16 LAB — POCT I-STAT 4, (NA,K, GLUC, HGB,HCT)
GLUCOSE: 220 mg/dL — AB (ref 65–99)
HCT: 33 % — ABNORMAL LOW (ref 39.0–52.0)
Hemoglobin: 11.2 g/dL — ABNORMAL LOW (ref 13.0–17.0)
Potassium: 4.3 mmol/L (ref 3.5–5.1)
Sodium: 139 mmol/L (ref 135–145)

## 2014-10-16 LAB — POCT I-STAT GLUCOSE
GLUCOSE: 98 mg/dL (ref 65–99)
OPERATOR ID: 3402

## 2014-10-16 LAB — GLUCOSE, CAPILLARY: Glucose-Capillary: 80 mg/dL (ref 65–99)

## 2014-10-16 LAB — HEMOGLOBIN AND HEMATOCRIT, BLOOD
HEMATOCRIT: 27.3 % — AB (ref 39.0–52.0)
Hemoglobin: 9.1 g/dL — ABNORMAL LOW (ref 13.0–17.0)

## 2014-10-16 LAB — PLATELET COUNT: Platelets: 118 10*3/uL — ABNORMAL LOW (ref 150–400)

## 2014-10-16 LAB — FIBRINOGEN: FIBRINOGEN: 294 mg/dL (ref 204–475)

## 2014-10-16 SURGERY — AORTIC VALVE REPLACEMENT (AVR)/CORONARY ARTERY BYPASS GRAFTING (CABG)
Anesthesia: General | Site: Chest

## 2014-10-16 MED ORDER — ANTISEPTIC ORAL RINSE SOLUTION (CORINZ)
7.0000 mL | Freq: Four times a day (QID) | OROMUCOSAL | Status: DC
  Administered 2014-10-16 – 2014-10-20 (×16): 7 mL via OROMUCOSAL

## 2014-10-16 MED ORDER — MIDAZOLAM HCL 5 MG/5ML IJ SOLN
INTRAMUSCULAR | Status: DC | PRN
  Administered 2014-10-16: 2 mg via INTRAVENOUS
  Administered 2014-10-16: 3 mg via INTRAVENOUS
  Administered 2014-10-16 (×2): 2 mg via INTRAVENOUS
  Administered 2014-10-16: 1 mg via INTRAVENOUS

## 2014-10-16 MED ORDER — SODIUM CHLORIDE 0.9 % IR SOLN
Status: DC | PRN
  Administered 2014-10-16: 3000 mL

## 2014-10-16 MED ORDER — DOCUSATE SODIUM 100 MG PO CAPS: 200.0000 mg | ORAL_CAPSULE | Freq: Every day | ORAL | Status: DC

## 2014-10-16 MED ORDER — VECURONIUM BROMIDE 10 MG IV SOLR
INTRAVENOUS | Status: AC
  Filled 2014-10-16: qty 30

## 2014-10-16 MED ORDER — METOPROLOL TARTRATE 25 MG/10 ML ORAL SUSPENSION
12.5000 mg | Freq: Two times a day (BID) | ORAL | Status: DC
  Filled 2014-10-16 (×3): qty 5

## 2014-10-16 MED ORDER — EPHEDRINE SULFATE 50 MG/ML IJ SOLN
INTRAMUSCULAR | Status: DC | PRN
  Administered 2014-10-16 (×4): 5 mg via INTRAVENOUS

## 2014-10-16 MED ORDER — ROCURONIUM BROMIDE 50 MG/5ML IV SOLN
INTRAVENOUS | Status: AC
  Filled 2014-10-16: qty 1

## 2014-10-16 MED ORDER — DEXMEDETOMIDINE HCL IN NACL 200 MCG/50ML IV SOLN
INTRAVENOUS | Status: AC
  Filled 2014-10-16: qty 50

## 2014-10-16 MED ORDER — HEPARIN SODIUM (PORCINE) 1000 UNIT/ML IJ SOLN
INTRAMUSCULAR | Status: DC | PRN
  Administered 2014-10-16: 3000 [IU] via INTRAVENOUS
  Administered 2014-10-16: 42000 [IU] via INTRAVENOUS

## 2014-10-16 MED ORDER — VECURONIUM BROMIDE 10 MG IV SOLR
10.0000 mg | Freq: Once | INTRAVENOUS | Status: AC
Start: 2014-10-16 — End: 2014-10-16
  Administered 2014-10-16: 10 mg via INTRAVENOUS

## 2014-10-16 MED ORDER — CHLORHEXIDINE GLUCONATE 0.12% ORAL RINSE (MEDLINE KIT)
15.0000 mL | Freq: Two times a day (BID) | OROMUCOSAL | Status: DC
  Administered 2014-10-16 – 2014-10-20 (×8): 15 mL via OROMUCOSAL

## 2014-10-16 MED ORDER — MILRINONE IN DEXTROSE 20 MG/100ML IV SOLN
0.2000 ug/kg/min | INTRAVENOUS | Status: DC
  Administered 2014-10-16 – 2014-10-17 (×2): 0.3 ug/kg/min via INTRAVENOUS
  Administered 2014-10-17: 0.2 ug/kg/min via INTRAVENOUS
  Administered 2014-10-18 – 2014-10-19 (×3): 0.3 ug/kg/min via INTRAVENOUS
  Filled 2014-10-16 (×8): qty 100

## 2014-10-16 MED ORDER — SODIUM BICARBONATE 8.4 % IV SOLN
50.0000 meq | Freq: Once | INTRAVENOUS | Status: AC
  Administered 2014-10-16: 50 meq via INTRAVENOUS
  Filled 2014-10-16: qty 50

## 2014-10-16 MED ORDER — FAMOTIDINE IN NACL 20-0.9 MG/50ML-% IV SOLN
20.0000 mg | Freq: Two times a day (BID) | INTRAVENOUS | Status: AC
  Administered 2014-10-16 – 2014-10-17 (×2): 20 mg via INTRAVENOUS
  Filled 2014-10-16: qty 50

## 2014-10-16 MED ORDER — FENTANYL CITRATE (PF) 250 MCG/5ML IJ SOLN
INTRAMUSCULAR | Status: AC
  Filled 2014-10-16: qty 5

## 2014-10-16 MED ORDER — MIDAZOLAM HCL 2 MG/2ML IJ SOLN: 2.0000 mg | INTRAMUSCULAR | Status: DC | PRN

## 2014-10-16 MED ORDER — LACTATED RINGERS IV SOLN: 500.0000 mL | Freq: Once | INTRAVENOUS | Status: DC | PRN

## 2014-10-16 MED ORDER — PROPOFOL 10 MG/ML IV BOLUS
INTRAVENOUS | Status: DC | PRN
  Administered 2014-10-16: 80 mg via INTRAVENOUS

## 2014-10-16 MED ORDER — LACTATED RINGERS IV SOLN
INTRAVENOUS | Status: DC | PRN
  Administered 2014-10-16: 08:00:00 via INTRAVENOUS

## 2014-10-16 MED ORDER — SUCCINYLCHOLINE CHLORIDE 20 MG/ML IJ SOLN
INTRAMUSCULAR | Status: AC
  Filled 2014-10-16: qty 1

## 2014-10-16 MED ORDER — OXYCODONE HCL 5 MG PO TABS: 5.0000 mg | ORAL_TABLET | ORAL | Status: DC | PRN

## 2014-10-16 MED ORDER — BISACODYL 5 MG PO TBEC: 10.0000 mg | DELAYED_RELEASE_TABLET | Freq: Every day | ORAL | Status: DC

## 2014-10-16 MED ORDER — SODIUM CHLORIDE 0.9 % IJ SOLN
3.0000 mL | Freq: Two times a day (BID) | INTRAMUSCULAR | Status: DC
  Administered 2014-10-17 – 2014-10-18 (×2): 3 mL via INTRAVENOUS
  Administered 2014-10-18: 10 mL via INTRAVENOUS
  Administered 2014-10-19 – 2014-10-23 (×7): 3 mL via INTRAVENOUS

## 2014-10-16 MED ORDER — TRAMADOL HCL 50 MG PO TABS: 50.0000 mg | ORAL_TABLET | ORAL | Status: DC | PRN

## 2014-10-16 MED ORDER — FENTANYL CITRATE (PF) 100 MCG/2ML IJ SOLN
INTRAMUSCULAR | Status: DC | PRN
  Administered 2014-10-16: 150 ug via INTRAVENOUS
  Administered 2014-10-16: 50 ug via INTRAVENOUS
  Administered 2014-10-16: 150 ug via INTRAVENOUS
  Administered 2014-10-16: 100 ug via INTRAVENOUS
  Administered 2014-10-16 (×4): 150 ug via INTRAVENOUS
  Administered 2014-10-16: 100 ug via INTRAVENOUS

## 2014-10-16 MED ORDER — DEXTROSE 5 % IV SOLN
1.5000 g | Freq: Two times a day (BID) | INTRAVENOUS | Status: AC
  Administered 2014-10-17 – 2014-10-18 (×4): 1.5 g via INTRAVENOUS
  Filled 2014-10-16 (×4): qty 1.5

## 2014-10-16 MED ORDER — METOPROLOL TARTRATE 12.5 MG HALF TABLET
12.5000 mg | ORAL_TABLET | Freq: Two times a day (BID) | ORAL | Status: DC
  Filled 2014-10-16 (×3): qty 1

## 2014-10-16 MED ORDER — LACTATED RINGERS IV SOLN: INTRAVENOUS | Status: DC

## 2014-10-16 MED ORDER — SODIUM CHLORIDE 0.45 % IV SOLN
INTRAVENOUS | Status: DC | PRN
  Administered 2014-10-16: 18:00:00 via INTRAVENOUS

## 2014-10-16 MED ORDER — CALCIUM CHLORIDE 10 % IV SOLN
INTRAVENOUS | Status: DC | PRN
  Administered 2014-10-16: 500 mg via INTRAVENOUS
  Administered 2014-10-16: 250 mg via INTRAVENOUS
  Administered 2014-10-16 (×2): 500 mg via INTRAVENOUS
  Administered 2014-10-16: 250 mg via INTRAVENOUS

## 2014-10-16 MED ORDER — PANTOPRAZOLE SODIUM 40 MG PO TBEC: 40.0000 mg | DELAYED_RELEASE_TABLET | Freq: Every day | ORAL | Status: DC

## 2014-10-16 MED ORDER — BISACODYL 10 MG RE SUPP
10.0000 mg | Freq: Every day | RECTAL | Status: DC
  Administered 2014-10-17 – 2014-10-24 (×6): 10 mg via RECTAL
  Filled 2014-10-16 (×6): qty 1

## 2014-10-16 MED ORDER — MIDAZOLAM HCL 10 MG/2ML IJ SOLN
INTRAMUSCULAR | Status: AC
  Filled 2014-10-16: qty 4

## 2014-10-16 MED ORDER — ROCURONIUM BROMIDE 100 MG/10ML IV SOLN
INTRAVENOUS | Status: DC | PRN
  Administered 2014-10-16: 50 mg via INTRAVENOUS

## 2014-10-16 MED ORDER — 0.9 % SODIUM CHLORIDE (POUR BTL) OPTIME
TOPICAL | Status: DC | PRN
  Administered 2014-10-16: 5000 mL
  Administered 2014-10-16: 2000 mL

## 2014-10-16 MED ORDER — SODIUM CHLORIDE 0.9 % IV SOLN
Freq: Once | INTRAVENOUS | Status: AC
  Administered 2014-10-16: 21:00:00 via INTRAVENOUS

## 2014-10-16 MED ORDER — ACETAMINOPHEN 500 MG PO TABS
1000.0000 mg | ORAL_TABLET | Freq: Four times a day (QID) | ORAL | Status: AC
  Administered 2014-10-18 – 2014-10-20 (×7): 1000 mg via ORAL
  Filled 2014-10-16 (×19): qty 2

## 2014-10-16 MED ORDER — DOPAMINE-DEXTROSE 3.2-5 MG/ML-% IV SOLN
2.5000 ug/kg/min | INTRAVENOUS | Status: DC
  Administered 2014-10-19: 5 ug/kg/min via INTRAVENOUS
  Filled 2014-10-16 (×4): qty 250

## 2014-10-16 MED ORDER — POTASSIUM CHLORIDE 10 MEQ/50ML IV SOLN: 10.0000 meq | INTRAVENOUS | Status: AC

## 2014-10-16 MED ORDER — SODIUM CHLORIDE 0.9 % IV SOLN
INTRAVENOUS | Status: AC
  Administered 2014-10-16: 19:00:00 via INTRAVENOUS

## 2014-10-16 MED ORDER — IPRATROPIUM-ALBUTEROL 0.5-2.5 (3) MG/3ML IN SOLN
3.0000 mL | Freq: Four times a day (QID) | RESPIRATORY_TRACT | Status: DC
  Administered 2014-10-16 – 2014-10-23 (×28): 3 mL via RESPIRATORY_TRACT
  Filled 2014-10-16 (×29): qty 3

## 2014-10-16 MED ORDER — PROPOFOL 10 MG/ML IV BOLUS
INTRAVENOUS | Status: AC
  Filled 2014-10-16: qty 20

## 2014-10-16 MED ORDER — ACETAMINOPHEN 160 MG/5ML PO SOLN
1000.0000 mg | Freq: Four times a day (QID) | ORAL | Status: AC
  Administered 2014-10-16 – 2014-10-21 (×8): 1000 mg
  Filled 2014-10-16 (×7): qty 40.6

## 2014-10-16 MED ORDER — MILRINONE IN DEXTROSE 20 MG/100ML IV SOLN
0.1250 ug/kg/min | INTRAVENOUS | Status: AC
  Administered 2014-10-16: .3 ug/kg/min via INTRAVENOUS
  Filled 2014-10-16: qty 100

## 2014-10-16 MED ORDER — PROTAMINE SULFATE 10 MG/ML IV SOLN
INTRAVENOUS | Status: DC | PRN
  Administered 2014-10-16: 320 mg via INTRAVENOUS
  Administered 2014-10-16: 30 mg via INTRAVENOUS

## 2014-10-16 MED ORDER — ASPIRIN EC 325 MG PO TBEC
325.0000 mg | DELAYED_RELEASE_TABLET | Freq: Every day | ORAL | Status: DC
  Filled 2014-10-16 (×6): qty 1

## 2014-10-16 MED ORDER — HEPARIN SODIUM (PORCINE) 1000 UNIT/ML IJ SOLN
INTRAMUSCULAR | Status: AC
  Filled 2014-10-16: qty 2

## 2014-10-16 MED ORDER — INSULIN REGULAR BOLUS VIA INFUSION
0.0000 [IU] | Freq: Three times a day (TID) | INTRAVENOUS | Status: DC
  Filled 2014-10-16: qty 10

## 2014-10-16 MED ORDER — ALBUMIN HUMAN 5 % IV SOLN
INTRAVENOUS | Status: DC | PRN
  Administered 2014-10-16 (×2): via INTRAVENOUS

## 2014-10-16 MED ORDER — HEMOSTATIC AGENTS (NO CHARGE) OPTIME
TOPICAL | Status: DC | PRN
  Administered 2014-10-16 (×3): 1 via TOPICAL

## 2014-10-16 MED ORDER — ACETAMINOPHEN 650 MG RE SUPP
650.0000 mg | Freq: Once | RECTAL | Status: DC
Start: 2014-10-16 — End: 2014-10-18

## 2014-10-16 MED ORDER — NITROGLYCERIN IN D5W 200-5 MCG/ML-% IV SOLN: 0.0000 ug/min | INTRAVENOUS | Status: DC

## 2014-10-16 MED ORDER — SODIUM CHLORIDE 0.9 % IJ SOLN
3.0000 mL | INTRAMUSCULAR | Status: DC | PRN
Start: 2014-10-17 — End: 2014-10-24

## 2014-10-16 MED ORDER — DEXTROSE 5 % IV SOLN
0.0000 ug/min | INTRAVENOUS | Status: DC
  Administered 2014-10-16: 6 ug/min via INTRAVENOUS
  Administered 2014-10-17: 9 ug/min via INTRAVENOUS
  Filled 2014-10-16 (×2): qty 4

## 2014-10-16 MED ORDER — ARTIFICIAL TEARS OP OINT
TOPICAL_OINTMENT | OPHTHALMIC | Status: AC
  Filled 2014-10-16: qty 3.5

## 2014-10-16 MED ORDER — ALBUMIN HUMAN 5 % IV SOLN
250.0000 mL | INTRAVENOUS | Status: AC | PRN
  Administered 2014-10-16 – 2014-10-17 (×3): 250 mL via INTRAVENOUS
  Filled 2014-10-16: qty 250

## 2014-10-16 MED ORDER — MORPHINE SULFATE (PF) 2 MG/ML IV SOLN: 1.0000 mg | INTRAVENOUS | Status: AC | PRN

## 2014-10-16 MED ORDER — SODIUM CHLORIDE 0.9 % IV SOLN
INTRAVENOUS | Status: DC | PRN
  Administered 2014-10-16: 16:00:00 via INTRAVENOUS

## 2014-10-16 MED ORDER — MORPHINE SULFATE (PF) 2 MG/ML IV SOLN
2.0000 mg | INTRAVENOUS | Status: DC | PRN
  Administered 2014-10-18: 2 mg via INTRAVENOUS
  Filled 2014-10-16 (×2): qty 1

## 2014-10-16 MED ORDER — ACETAMINOPHEN 160 MG/5ML PO SOLN: 650.0000 mg | Freq: Once | ORAL | Status: DC

## 2014-10-16 MED ORDER — SODIUM BICARBONATE 8.4 % IV SOLN
50.0000 meq | Freq: Once | INTRAVENOUS | Status: AC
  Administered 2014-10-16: 50 meq via INTRAVENOUS

## 2014-10-16 MED ORDER — METOPROLOL TARTRATE 1 MG/ML IV SOLN: 2.5000 mg | INTRAVENOUS | Status: DC | PRN

## 2014-10-16 MED ORDER — MAGNESIUM SULFATE 4 GM/100ML IV SOLN
4.0000 g | Freq: Once | INTRAVENOUS | Status: AC
  Administered 2014-10-16: 4 g via INTRAVENOUS
  Filled 2014-10-16: qty 100

## 2014-10-16 MED ORDER — DEXTROSE 5 % IV SOLN
0.0000 ug/min | INTRAVENOUS | Status: DC
  Administered 2014-10-16: 100 ug/min via INTRAVENOUS
  Filled 2014-10-16: qty 2

## 2014-10-16 MED ORDER — VANCOMYCIN HCL IN DEXTROSE 1-5 GM/200ML-% IV SOLN
1000.0000 mg | Freq: Once | INTRAVENOUS | Status: AC
  Administered 2014-10-18: 1000 mg via INTRAVENOUS
  Filled 2014-10-16: qty 200

## 2014-10-16 MED ORDER — CHLORHEXIDINE GLUCONATE 4 % EX LIQD: 30.0000 mL | CUTANEOUS | Status: DC

## 2014-10-16 MED ORDER — ONDANSETRON HCL 4 MG/2ML IJ SOLN: 4.0000 mg | Freq: Four times a day (QID) | INTRAMUSCULAR | Status: DC | PRN

## 2014-10-16 MED ORDER — ASPIRIN 81 MG PO CHEW
324.0000 mg | CHEWABLE_TABLET | Freq: Every day | ORAL | Status: DC
Start: 2014-10-17 — End: 2014-10-22
  Administered 2014-10-17 – 2014-10-22 (×4): 324 mg
  Filled 2014-10-16 (×6): qty 4

## 2014-10-16 MED ORDER — DEXMEDETOMIDINE HCL IN NACL 200 MCG/50ML IV SOLN
0.0000 ug/kg/h | INTRAVENOUS | Status: DC
  Administered 2014-10-16: 0.5 ug/kg/h via INTRAVENOUS
  Filled 2014-10-16: qty 50

## 2014-10-16 MED ORDER — PHENYLEPHRINE HCL 10 MG/ML IJ SOLN
0.0000 ug/min | INTRAVENOUS | Status: DC
  Administered 2014-10-16: 100 ug/min via INTRAVENOUS
  Filled 2014-10-16 (×5): qty 4

## 2014-10-16 MED ORDER — INSULIN REGULAR HUMAN 100 UNIT/ML IJ SOLN
INTRAMUSCULAR | Status: DC
  Administered 2014-10-16: 4.5 [IU]/h via INTRAVENOUS
  Administered 2014-10-17: 21.9 [IU]/h via INTRAVENOUS
  Filled 2014-10-16 (×4): qty 2.5

## 2014-10-16 MED ORDER — DEXMEDETOMIDINE HCL IN NACL 400 MCG/100ML IV SOLN
0.0000 ug/kg/h | INTRAVENOUS | Status: DC
  Administered 2014-10-16: 0.7 ug/kg/h via INTRAVENOUS
  Administered 2014-10-17: 0.4 ug/kg/h via INTRAVENOUS
  Administered 2014-10-17: 0.7 ug/kg/h via INTRAVENOUS
  Filled 2014-10-16 (×2): qty 100

## 2014-10-16 MED ORDER — SODIUM CHLORIDE 0.9 % IJ SOLN
INTRAMUSCULAR | Status: AC
  Filled 2014-10-16: qty 10

## 2014-10-16 MED ORDER — SUCCINYLCHOLINE CHLORIDE 20 MG/ML IJ SOLN
INTRAMUSCULAR | Status: DC | PRN
  Administered 2014-10-16: 140 mg via INTRAVENOUS

## 2014-10-16 MED ORDER — ALBUTEROL SULFATE HFA 108 (90 BASE) MCG/ACT IN AERS
INHALATION_SPRAY | RESPIRATORY_TRACT | Status: DC | PRN
  Administered 2014-10-16: 5 via RESPIRATORY_TRACT

## 2014-10-16 MED ORDER — SODIUM CHLORIDE 0.9 % IV SOLN: INTRAVENOUS | Status: DC

## 2014-10-16 MED ORDER — EPINEPHRINE HCL 0.1 MG/ML IJ SOSY
PREFILLED_SYRINGE | INTRAMUSCULAR | Status: DC | PRN
  Administered 2014-10-16 (×2): 5 ug via INTRAVENOUS

## 2014-10-16 MED ORDER — SODIUM CHLORIDE 0.9 % IV SOLN
INTRAVENOUS | Status: DC
  Filled 2014-10-16: qty 40

## 2014-10-16 MED ORDER — EPINEPHRINE HCL 1 MG/ML IJ SOLN
4000.0000 ug | INTRAVENOUS | Status: DC | PRN
  Administered 2014-10-16: .03 ug/min via INTRAVENOUS
  Administered 2014-10-16: .05 ug/min via INTRAVENOUS

## 2014-10-16 MED ORDER — SODIUM CHLORIDE 0.9 % IV SOLN
250.0000 mL | INTRAVENOUS | Status: DC
  Administered 2014-10-16: 250 mL via INTRAVENOUS

## 2014-10-16 MED ORDER — STERILE WATER FOR INJECTION IJ SOLN
INTRAMUSCULAR | Status: AC
  Filled 2014-10-16: qty 40

## 2014-10-16 MED ORDER — VECURONIUM BROMIDE 10 MG IV SOLR
INTRAVENOUS | Status: DC | PRN
  Administered 2014-10-16 (×4): 5 mg via INTRAVENOUS

## 2014-10-16 SURGICAL SUPPLY — 128 items
ADAPTER CARDIO PERF ANTE/RETRO (ADAPTER) ×3 IMPLANT
APPLIER CLIP 9.375 SM OPEN (CLIP) ×3
ATTRACTOMAT 16X20 MAGNETIC DRP (DRAPES) ×3 IMPLANT
BAG DECANTER FOR FLEXI CONT (MISCELLANEOUS) ×3 IMPLANT
BANDAGE ELASTIC 4 VELCRO ST LF (GAUZE/BANDAGES/DRESSINGS) ×6 IMPLANT
BANDAGE ELASTIC 6 VELCRO ST LF (GAUZE/BANDAGES/DRESSINGS) ×6 IMPLANT
BASKET HEART (ORDER IN 25'S) (MISCELLANEOUS) ×1
BASKET HEART (ORDER IN 25S) (MISCELLANEOUS) ×2 IMPLANT
BLADE STERNUM SYSTEM 6 (BLADE) ×3 IMPLANT
BLADE SURG 11 STRL SS (BLADE) ×3 IMPLANT
BNDG GAUZE ELAST 4 BULKY (GAUZE/BANDAGES/DRESSINGS) ×6 IMPLANT
CANISTER SUCTION 2500CC (MISCELLANEOUS) ×3 IMPLANT
CANN PRFSN 3/8X14X24FR PCFC (MISCELLANEOUS)
CANN PRFSN 3/8XCNCT ST RT ANG (MISCELLANEOUS)
CANNULA EZ GLIDE 8.0 24FR (CANNULA) ×3 IMPLANT
CANNULA EZ GLIDE AORTIC 21FR (CANNULA) ×3 IMPLANT
CANNULA GUNDRY RCSP 15FR (MISCELLANEOUS) ×6 IMPLANT
CANNULA PRFSN 3/8X14X24FR PCFC (MISCELLANEOUS) IMPLANT
CANNULA PRFSN 3/8XCNCT RT ANG (MISCELLANEOUS) IMPLANT
CANNULA SUMP PERICARDIAL (CANNULA) ×3 IMPLANT
CANNULA VEN MTL TIP RT (MISCELLANEOUS)
CANNULA VENNOUS METAL TIP 20FR (CANNULA) ×3 IMPLANT
CATH CPB KIT OWEN (MISCELLANEOUS) ×3 IMPLANT
CATH HEART VENT LEFT (CATHETERS) ×2 IMPLANT
CATH ROBINSON RED A/P 18FR (CATHETERS) ×3 IMPLANT
CATH THORACIC 28FR RT ANG (CATHETERS) IMPLANT
CATH THORACIC 36FR (CATHETERS) ×3 IMPLANT
CLAMP ISOLATOR SYNERGY LG (MISCELLANEOUS) ×3 IMPLANT
CLIP APPLIE 9.375 SM OPEN (CLIP) ×2 IMPLANT
CLIP FOGARTY SPRING 6M (CLIP) IMPLANT
CLIP RETRACTION 3.0MM CORONARY (MISCELLANEOUS) ×3 IMPLANT
CONN 1/2X1/2X1/2  BEN (MISCELLANEOUS) ×1
CONN 1/2X1/2X1/2 BEN (MISCELLANEOUS) ×2 IMPLANT
CONN 3/8X1/2 ST GISH (MISCELLANEOUS) ×6 IMPLANT
CONN ST 1/4X3/8  BEN (MISCELLANEOUS) ×3
CONN ST 1/4X3/8 BEN (MISCELLANEOUS) ×6 IMPLANT
CONT SPEC 4OZ CLIKSEAL STRL BL (MISCELLANEOUS) ×3 IMPLANT
COVER PROBE W GEL 5X96 (DRAPES) ×3 IMPLANT
COVER SURGICAL LIGHT HANDLE (MISCELLANEOUS) ×3 IMPLANT
CRADLE DONUT ADULT HEAD (MISCELLANEOUS) ×3 IMPLANT
DEVICE SUT CK QUICK LOAD INDV (Prosthesis & Implant Heart) ×18 IMPLANT
DEVICE SUT CK QUICK LOAD MINI (Prosthesis & Implant Heart) ×3 IMPLANT
DRAIN CHANNEL 32F RND 10.7 FF (WOUND CARE) ×9 IMPLANT
DRAPE CARDIOVASCULAR INCISE (DRAPES) ×1
DRAPE SLUSH/WARMER DISC (DRAPES) ×3 IMPLANT
DRAPE SRG 135X102X78XABS (DRAPES) ×2 IMPLANT
DRSG AQUACEL AG ADV 3.5X14 (GAUZE/BANDAGES/DRESSINGS) ×3 IMPLANT
DRSG COVADERM 4X14 (GAUZE/BANDAGES/DRESSINGS) ×3 IMPLANT
ELECT BLADE 4.0 EZ CLEAN MEGAD (MISCELLANEOUS) ×3
ELECT REM PT RETURN 9FT ADLT (ELECTROSURGICAL) ×6
ELECTRODE BLDE 4.0 EZ CLN MEGD (MISCELLANEOUS) ×2 IMPLANT
ELECTRODE REM PT RTRN 9FT ADLT (ELECTROSURGICAL) ×4 IMPLANT
GAUZE SPONGE 4X4 12PLY STRL (GAUZE/BANDAGES/DRESSINGS) ×6 IMPLANT
GEL ULTRASOUND 8.5O AQUASONIC (MISCELLANEOUS) ×3 IMPLANT
GLOVE BIO SURGEON STRL SZ 6 (GLOVE) ×3 IMPLANT
GLOVE BIO SURGEON STRL SZ 6.5 (GLOVE) ×21 IMPLANT
GLOVE BIO SURGEON STRL SZ7 (GLOVE) IMPLANT
GLOVE BIO SURGEON STRL SZ7.5 (GLOVE) ×6 IMPLANT
GLOVE BIO SURGEON STRL SZ8 (GLOVE) ×3 IMPLANT
GLOVE BIOGEL PI IND STRL 6.5 (GLOVE) ×12 IMPLANT
GLOVE BIOGEL PI INDICATOR 6.5 (GLOVE) ×6
GLOVE ORTHO TXT STRL SZ7.5 (GLOVE) ×6 IMPLANT
GOWN STRL REUS W/ TWL LRG LVL3 (GOWN DISPOSABLE) ×8 IMPLANT
GOWN STRL REUS W/TWL LRG LVL3 (GOWN DISPOSABLE) ×4
HEMOSTAT POWDER SURGIFOAM 1G (HEMOSTASIS) ×9 IMPLANT
INSERT FOGARTY XLG (MISCELLANEOUS) ×6 IMPLANT
KIT BASIN OR (CUSTOM PROCEDURE TRAY) ×3 IMPLANT
KIT DILATOR VASC 18G NDL (KITS) ×3 IMPLANT
KIT DRAINAGE VACCUM ASSIST (KITS) ×3 IMPLANT
KIT ROOM TURNOVER OR (KITS) ×3 IMPLANT
KIT SUCTION CATH 14FR (SUCTIONS) ×9 IMPLANT
KIT SUT CK MINI COMBO 4X17 (Prosthesis & Implant Heart) ×3 IMPLANT
LEAD PACING MYOCARDI (MISCELLANEOUS) ×3 IMPLANT
LINE VENT (MISCELLANEOUS) ×3 IMPLANT
LIQUID BAND (GAUZE/BANDAGES/DRESSINGS) ×9 IMPLANT
LOOP VESSEL SUPERMAXI WHITE (MISCELLANEOUS) ×6 IMPLANT
MARKER GRAFT CORONARY BYPASS (MISCELLANEOUS) ×9 IMPLANT
NS IRRIG 1000ML POUR BTL (IV SOLUTION) ×15 IMPLANT
PACK OPEN HEART (CUSTOM PROCEDURE TRAY) ×3 IMPLANT
PAD ARMBOARD 7.5X6 YLW CONV (MISCELLANEOUS) ×6 IMPLANT
PENCIL BUTTON HOLSTER BLD 10FT (ELECTRODE) ×3 IMPLANT
PROBE CRYO2-ABLATION MALLABLE (MISCELLANEOUS) ×3 IMPLANT
PUNCH AORTIC ROT 4.0MM RCL 40 (MISCELLANEOUS) ×3 IMPLANT
SENSOR MYOCARDIAL TEMP (MISCELLANEOUS) ×3 IMPLANT
SET CARDIOPLEGIA MPS 5001102 (MISCELLANEOUS) ×3 IMPLANT
SET IRRIG TUBING LAPAROSCOPIC (IRRIGATION / IRRIGATOR) ×3 IMPLANT
SOLUTION ANTI FOG 6CC (MISCELLANEOUS) ×3 IMPLANT
SPONGE GAUZE 4X4 12PLY STER LF (GAUZE/BANDAGES/DRESSINGS) ×9 IMPLANT
SPONGE LAP 18X18 X RAY DECT (DISPOSABLE) ×12 IMPLANT
SUCKER INTRACARDIAC WEIGHTED (SUCKER) ×3 IMPLANT
SUT BONE WAX W31G (SUTURE) ×6 IMPLANT
SUT ETHIBON 2 0 V 52N 30 (SUTURE) ×9 IMPLANT
SUT ETHIBOND 2 0 SH (SUTURE) ×2
SUT ETHIBOND 2 0 SH 36X2 (SUTURE) ×4 IMPLANT
SUT ETHIBOND X763 2 0 SH 1 (SUTURE) ×15 IMPLANT
SUT MNCRL AB 3-0 PS2 18 (SUTURE) ×12 IMPLANT
SUT PDS AB 1 CTX 36 (SUTURE) ×12 IMPLANT
SUT PROLENE 3 0 SH 1 (SUTURE) ×6 IMPLANT
SUT PROLENE 3 0 SH DA (SUTURE) ×9 IMPLANT
SUT PROLENE 3 0 SH1 36 (SUTURE) ×6 IMPLANT
SUT PROLENE 4 0 RB 1 (SUTURE) ×10
SUT PROLENE 4 0 SH DA (SUTURE) ×21 IMPLANT
SUT PROLENE 4-0 RB1 .5 CRCL 36 (SUTURE) ×20 IMPLANT
SUT PROLENE 6 0 C 1 30 (SUTURE) ×6 IMPLANT
SUT PROLENE 7.0 RB 3 (SUTURE) ×24 IMPLANT
SUT PROLENE 8 0 BV175 6 (SUTURE) ×3 IMPLANT
SUT PROLENE BLUE 7 0 (SUTURE) ×6 IMPLANT
SUT SILK  1 MH (SUTURE) ×4
SUT SILK 1 MH (SUTURE) ×8 IMPLANT
SUT STEEL 6MS V (SUTURE) IMPLANT
SUT STEEL STERNAL CCS#1 18IN (SUTURE) IMPLANT
SUT STEEL SZ 6 DBL 3X14 BALL (SUTURE) IMPLANT
SUT VIC AB 2-0 CT1 27 (SUTURE) ×1
SUT VIC AB 2-0 CT1 TAPERPNT 27 (SUTURE) ×2 IMPLANT
SUT VIC AB 2-0 CTX 27 (SUTURE) ×9 IMPLANT
SUT VIC AB 3-0 X1 27 (SUTURE) ×9 IMPLANT
SYS ARTICLIP LAA EXCLUSION 140 (Clip) ×3 IMPLANT
SYS ATRICLIP LAA EXCLUSION 45 (CLIP) IMPLANT
SYSTEM SAHARA CHEST DRAIN ATS (WOUND CARE) ×3 IMPLANT
TAPE CLOTH SURG 4X10 WHT LF (GAUZE/BANDAGES/DRESSINGS) ×3 IMPLANT
TAPE PAPER 2X10 WHT MICROPORE (GAUZE/BANDAGES/DRESSINGS) ×3 IMPLANT
TOWEL OR 17X24 6PK STRL BLUE (TOWEL DISPOSABLE) ×9 IMPLANT
TOWEL OR 17X26 10 PK STRL BLUE (TOWEL DISPOSABLE) ×6 IMPLANT
TRAY FOLEY IC TEMP SENS 14FR (CATHETERS) ×3 IMPLANT
UNDERPAD 30X30 INCONTINENT (UNDERPADS AND DIAPERS) ×3 IMPLANT
VALVE MAGNA EASE AORTIC 27MM (Prosthesis & Implant Heart) ×3 IMPLANT
VENT LEFT HEART 12002 (CATHETERS) ×3
WATER STERILE IRR 1000ML POUR (IV SOLUTION) ×6 IMPLANT

## 2014-10-16 NOTE — Anesthesia Procedure Notes (Addendum)
Anesthesia Procedure Note Risks, benefits, and alternatives discussed for central line placement. Patient agrees to procedure.  Patient positioned supine with mild trendelenburg.  Sterile prep and drape of right neck.  Full barrier precautions taken.  1% injectable lidocaine placed SQ for topical anesthesia.  Central line placed with ultrasound guidance.  Appropriate blood return from catheter.  No air.  Sutured into place.  Sterile dressing applied.  No complications. Patient tolerated the procedure well. Procedure Name: Intubation Date/Time: 10/16/2014 8:42 AM Performed by: Adonis Housekeeper Pre-anesthesia Checklist: Patient identified, Emergency Drugs available, Suction available and Patient being monitored Patient Re-evaluated:Patient Re-evaluated prior to inductionOxygen Delivery Method: Circle system utilized Preoxygenation: Pre-oxygenation with 100% oxygen Intubation Type: IV induction Ventilation: Mask ventilation without difficulty and Oral airway inserted - appropriate to patient size Laryngoscope Size: Mac and 4 Grade View: Grade I Tube type: Oral Tube size: 8.0 mm Number of attempts: 1 Airway Equipment and Method: Stylet Placement Confirmation: ETT inserted through vocal cords under direct vision,  positive ETCO2 and breath sounds checked- equal and bilateral Secured at: 21 cm Tube secured with: Tape Dental Injury: Teeth and Oropharynx as per pre-operative assessment

## 2014-10-16 NOTE — Anesthesia Postprocedure Evaluation (Addendum)
  Anesthesia Post-op Note  Patient: Charles [name]  Procedure(s) Performed: Procedure(s): AORTIC VALVE REPLACEMENT (AVR)/CORONARY ARTERY BYPASS GRAFTING (CABG) x4 using left internal mammary artery and bilateral greater saphenous veins harvested laparoscopically (N/A) MAZE (N/A) TRANSESOPHAGEAL ECHOCARDIOGRAM (TEE) (N/A) CLIPPING OF ATRIAL APPENDAGE (N/A)  Patient Location: SICU  Anesthesia Type:General  Level of Consciousness: sedated and Patient remains intubated per anesthesia plan  Airway and Oxygen Therapy: Patient remains intubated per anesthesia plan and Patient placed on Ventilator (see vital sign flow sheet for setting)  Post-op Pain: none  Post-op Assessment: Post-op Vital signs reviewed, Patient's Cardiovascular Status Stable, Respiratory Function Stable, Patent Airway, No signs of Nausea or vomiting, Adequate PO intake, Pain level controlled, No headache and No backache              Post-op Vital Signs: Reviewed and stable, requiring vasoactive and inotropic medications  Last Vitals:  BP 80s/40s SaO2 93% HR V paced at 80  Complications: No apparent anesthesia complications

## 2014-10-16 NOTE — Anesthesia Preprocedure Evaluation (Signed)
Anesthesia Evaluation  Patient identified by MRN, date of birth, ID band Patient awake    Reviewed: Allergy & Precautions, H&P , NPO status , Patient's Chart, lab work & pertinent test results  History of Anesthesia Complications Negative for: history of anesthetic complications  Airway Mallampati: II  TM Distance: >3 FB Neck ROM: full    Dental no notable dental hx.    Pulmonary asthma , sleep apnea and Continuous Positive Airway Pressure Ventilation , COPDformer smoker,  breath sounds clear to auscultation  Pulmonary exam normal       Cardiovascular hypertension, Pt. on medications + CAD, +CHF and + DOE Normal cardiovascular exam+ dysrhythmias Atrial Fibrillation + Valvular Problems/Murmurs AS Rhythm:regular Rate:Normal     Neuro/Psych CVA    GI/Hepatic negative GI ROS, Neg liver ROS,   Endo/Other  diabetes, Poorly Controlled, Insulin Dependent  Renal/GU Renal disease     Musculoskeletal  (+) Arthritis -,   Abdominal   Peds  Hematology negative hematology ROS (+)   Anesthesia Other Findings   Reproductive/Obstetrics negative OB ROS                             Anesthesia Physical Anesthesia Plan  ASA: IV  Anesthesia Plan: General   Post-op Pain Management:    Induction: Intravenous  Airway Management Planned: Oral ETT  Additional Equipment: Arterial line, CVP, PA Cath, Ultrasound Guidance Line Placement, TEE and 3D TEE  Intra-op Plan:   Post-operative Plan: Possible Post-op intubation/ventilation  Informed Consent: I have reviewed the patients History and Physical, chart, labs and discussed the procedure including the risks, benefits and alternatives for the proposed anesthesia with the patient or authorized representative who has indicated his/her understanding and acceptance.     Plan Discussed with: Anesthesiologist, CRNA and Surgeon  Anesthesia Plan Comments:          Anesthesia Quick Evaluation

## 2014-10-16 NOTE — Interval H&P Note (Signed)
History and Physical Interval Note:  10/16/2014 7:48 AM  Charles [name]  has presented today for surgery, with the diagnosis of aortic stenosis, coronary artery disease, atrial fibrillation  The various methods of treatment have been discussed with the patient and family. After consideration of risks, benefits and other options for treatment, the patient has consented to  Procedure(s): AORTIC VALVE REPLACEMENT (AVR)/CORONARY ARTERY BYPASS GRAFTING (CABG) and MAZE procedure (N/A) MAZE (N/A) TRANSESOPHAGEAL ECHOCARDIOGRAM (TEE) (N/A) as a surgical intervention .  The patient's history has been reviewed, patient examined, no change in status, stable for surgery.  I have reviewed the patient's chart and labs.  Questions were answered to the patient's satisfaction.     [name]

## 2014-10-16 NOTE — OR Nursing (Signed)
1634 SICU notified of surgery's progress (retractor out).

## 2014-10-16 NOTE — OR Nursing (Signed)
16:27 Off-pump call to SICU RN 16:30 OR staff member updated pt's family on pt status per Dr. Orvan July request.

## 2014-10-16 NOTE — Progress Notes (Signed)
      [address]       [place] [address]             [phone]       S/p AVR, CABG, maze  Intubated, sedated  BP 103/47 mmHg  Pulse 92  Temp(Src) 96.3 F (35.7 C) (Oral)  Resp 20  Wt 280 lb (127.007 kg)  SpO2 97%  PAP 36/25 CO= 5.05 on milrinone, epi, dopamine   Intake/Output Summary (Last 24 hours) at 10/16/14 1902 Last data filed at 10/16/14 1830  Gross per 24 hour  Intake 5408.2 ml  Output   4220 ml  Net 1188.2 ml   K= 4.3, Hct 33, PLT 86 K  Currently on 100%  FiO2 and 6 of PEEP  Steven C. Dorris Fetch, MD [facility] [phone]

## 2014-10-16 NOTE — Progress Notes (Signed)
HR 52 this am, no betablocker given.  Patient took it last night.

## 2014-10-16 NOTE — Transfer of Care (Signed)
Immediate Anesthesia Transfer of Care Note  Patient: Charles [name]  Procedure(s) Performed: Procedure(s): AORTIC VALVE REPLACEMENT (AVR)/CORONARY ARTERY BYPASS GRAFTING (CABG) x4 using left internal mammary artery and bilateral greater saphenous veins harvested laparoscopically (N/A) MAZE (N/A) TRANSESOPHAGEAL ECHOCARDIOGRAM (TEE) (N/A) CLIPPING OF ATRIAL APPENDAGE (N/A)  Patient Location: SICU  Anesthesia Type:General  Level of Consciousness: sedated and Patient remains intubated per anesthesia plan  Airway & Oxygen Therapy: Patient remains intubated per anesthesia plan and Patient placed on Ventilator (see vital sign flow sheet for setting)  Post-op Assessment: Report given to RN and Post -op Vital signs reviewed and stable  Post vital signs: Reviewed and stable  Last Vitals:  Filed Vitals:   10/16/14 0658  BP: 166/46  Pulse: 52  Temp: 36.5 C  Resp: 20    Complications: No apparent anesthesia complications

## 2014-10-16 NOTE — Progress Notes (Signed)
  Echocardiogram Echocardiogram Transesophageal has been performed.  Arvil Chaco 10/16/2014, 9:55 AM

## 2014-10-16 NOTE — Consult Note (Addendum)
PULMONARY / CRITICAL CARE MEDICINE   Name: Charles [name] MRN: [id number] DOB: May 18, 1940    ADMISSION DATE:  10/16/2014 CONSULTATION DATE:  10/16/14   REFERRING MD :  Dr Roxy Manns  REASON FOR CONSULTATION: Acute Respiratory Failure  INITIAL PRESENTATION: 74 yo obese man with hx COPD, restrictive lung disease, OSA/ OHS, chronic A fib, CAD with stable angina, Aortic Stenosis. He was admitted for elective CABG + AVR + Maze, performed on 10/16/14. He returned to the ICU intubated and sedated.   STUDIES:  L heart cath 07/18/14 >> severe three-vessel coronary artery disease with moderate aortic stenosis and elevated left ventricular filling pressures. Mean transvalvular gradient was reported 23.4 mmHg. Ejection fraction was estimated 45-50% PFT 08/12/14 >> combined restriction and obstruction resulting in severely decreased FEV1 1.72L (50% pred), positive BD response, restriction on lung volumes, decreased DLCO that almost fully corrects when adjusted for Va  SIGNIFICANT EVENTS: 8/17 > CABG + AVR + Maze  HISTORY OF PRESENT ILLNESS:  Pleasant 74 yo man, followed by me for obesity with OSA/OHS, COPD and restrictive lung disease. He has good CPAP compliance, is not on any scheduled BD's as he has never felt tat he benefits from them in the past. He also has a hx of a RUL pulmonary nodule dating back to 2005. A repeat CT chest 08/12/14 confirmed stability of the calcifying RUL nodule, shows smaller L sided nodules. He has been followed by Dr Ron Parker for CAD, moderate AS and A Fib with associated exertional dyspnea and apparent anginal sx, evaluated by Dr Roxy Manns for management. He underwent CABG + AVR + Maze procedure on 10/16/14. Returns to the SICU post-op. PCCM consulted to assist with his care.   PAST MEDICAL HISTORY :   has a past medical history of Morbid obesity; Diverticulosis of colon; Acute pancreatitis; COPD (chronic obstructive pulmonary disease); OSA (obstructive sleep apnea); CAD (coronary artery  disease); Cataract; Nonexudative senile macular degeneration of retina; Solitary pulmonary nodule; Hyperlipidemia; Osteoarthritis; Anemia; Diabetes mellitus, type 2; Hypertension; Aortic stenosis; Atrial fibrillation; DOE (dyspnea on exertion); Edema; Chronic combined systolic and diastolic congestive heart failure; Visual impairment; Physical deconditioning; Limited mobility; Incidental pulmonary nodule, greater than or equal to 62m (08/12/2014); Dysrhythmia; Heart murmur; Stroke (1997); Asthma; S/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 + CABG x 4 + maze procedure (10/16/2014); S/P CABG x 4 (10/16/2014); and S/P Maze operation for atrial fibrillation (10/16/2014).  has past surgical history that includes Cholecystectomy; Tonsillectomy; Cardiac catheterization (N/A, 07/18/2014); and Eye surgery (Bilateral). Prior to Admission medications   Medication Sig Start Date End Date Taking? Authorizing Provider  Aflibercept 2 MG/0.05ML SOLN Inject into the eye as directed. Injection into right eye every 4 weeks   Yes Historical Provider, MD  amiodarone (PACERONE) 200 MG tablet Take 1 tablet (200 mg total) by mouth 2 (two) times daily. 09/16/14  Yes CRexene Alberts MD  aspirin EC 81 MG tablet Take 1 tablet (81 mg total) by mouth daily. 07/01/14  Yes TJanith Lima MD  atenolol (TENORMIN) 50 MG tablet Take 25 mg by mouth daily. Taking one half tablet (25 mg) by mouth daily   Yes Historical Provider, MD  hydrochlorothiazide (HYDRODIURIL) 25 MG tablet Take 25 mg by mouth daily.   Yes Historical Provider, MD  Insulin Isophane & Regular Human (HUMULIN 70/30 KWIKPEN) (70-30) 100 UNIT/ML PEN Inject 30 Units into the skin 2 (two) times daily with a meal. 03/11/14  Yes TJanith Lima MD  lisinopril (PRINIVIL,ZESTRIL) 20 MG tablet Take 20 mg  by mouth daily.   Yes Historical Provider, MD  metFORMIN (GLUCOPHAGE) 1000 MG tablet Take 1,000 mg by mouth 2 (two) times daily with a meal.   Yes Historical Provider, MD   minocycline (MINOCIN,DYNACIN) 100 MG capsule Take 100 mg by mouth 3 (three) times a week.  05/31/14  Yes Historical Provider, MD  nitroGLYCERIN (NITROSTAT) 0.4 MG SL tablet Place 1 tablet (0.4 mg total) under the tongue every 5 (five) minutes as needed for chest pain. 07/01/14  Yes Janith Lima, MD  albuterol (PROVENTIL HFA;VENTOLIN HFA) 108 (90 BASE) MCG/ACT inhaler Inhale 2 puffs into the lungs every 6 (six) hours as needed for wheezing or shortness of breath.    Historical Provider, MD  B-D INS SYRINGE 0.5CC/30GX1/2" 30G X 1/2" 0.5 ML MISC USE AS DIRECTED. 02/04/12   Neena Rhymes, MD  Blood Glucose Monitoring Suppl [facility] BLOOD GLUCOSE) W/DEVICE KIT Test up to TID Dx: E11.39 12/13/13   Janith Lima, MD  glucose blood (TRUETRACK TEST) test strip Test up to TID Dx:E11.39 12/13/13   Janith Lima, MD  Respiratory Therapy Supplies (FLUTTER) DEVI Use as directed 06/21/13   Collene Gobble, MD   No Known Allergies  FAMILY HISTORY:  indicated that his mother is deceased. He indicated that his father is deceased.  SOCIAL HISTORY:  reports that he quit smoking about 24 years ago. His smoking use included Cigarettes. He has a 105 pack-year smoking history. He has never used smokeless tobacco. He reports that he drinks alcohol. He reports that he does not use illicit drugs.  REVIEW OF SYSTEMS:  Unable to obtain  SUBJECTIVE:  Pt unable to give a history  VITAL SIGNS: Temp:  [96.1 F (35.6 C)-97.7 F (36.5 C)] 96.1 F (35.6 C) (08/17 1900) Pulse Rate:  [29-108] 95 (08/17 1900) Resp:  [14-20] 20 (08/17 1900) BP: (81-166)/(37-47) 110/42 mmHg (08/17 1900) SpO2:  [93 %-100 %] 98 % (08/17 1900) Arterial Line BP: (82-114)/(39-51) 114/51 mmHg (08/17 1900) FiO2 (%):  [100 %] 100 % (08/17 1829) Weight:  [127.007 kg (280 lb)] 127.007 kg (280 lb) (08/17 0658) HEMODYNAMICS: PAP: (28-37)/(19-28) 37/28 mmHg CO:  [5.1 L/min] 5.1 L/min CI:  [2 L/min/m2] 2 L/min/m2 VENTILATOR SETTINGS: Vent Mode:   [-] PRVC FiO2 (%):  [100 %] 100 % Set Rate:  [20 bmp] 20 bmp Vt Set:  [650 mL] 650 mL PEEP:  [6 cmH20] 6 cmH20 Plateau Pressure:  [19 cmH20] 19 cmH20 INTAKE / OUTPUT:  Intake/Output Summary (Last 24 hours) at 10/16/14 1907 Last data filed at 10/16/14 1900  Gross per 24 hour  Intake 5408.2 ml  Output   4400 ml  Net 1008.2 ml    PHYSICAL EXAMINATION: General:  Obese man, sedated, intubated Neuro:  Sedated, grimace with stim HEENT:  ETT, OGT, Pupils 4m and sluggish Cardiovascular:  Paced, distant but regular Lungs:  Clear on R, very decreased L halfway up, clearing superiorly Abdomen:  Obese, non-tender, no BS heard Musculoskeletal:  L LE wrapped, no deformities, no significant edema Skin:  No rash  LABS:  CBC  Recent Labs Lab 10/14/14 1335  10/16/14 1447  10/16/14 1624 10/16/14 1815 10/16/14 1824  WBC 8.4  --   --   --   --  15.8*  --   HGB 14.1  < > 9.1*  < > 10.5* 9.1* 11.2*  HCT 43.4  < > 27.3*  < > 31.0* 28.1* 33.0*  PLT 190  --  118*  --   --  86*  --   < > = values in this interval not displayed. Coag's  Recent Labs Lab 10/14/14 1335 10/16/14 1815  APTT 32 43*  INR 1.13 1.89*   BMET  Recent Labs Lab 10/14/14 1335  10/16/14 1337 10/16/14 1450 10/16/14 1624 10/16/14 1824  NA 140  < > 138 139 138 139  K 4.1  < > 4.4 5.0 4.6 4.3  CL 102  < > 102 102 102  --   CO2 27  --   --   --   --   --   BUN 25*  < > 26* 24* 24*  --   CREATININE 1.47*  < > 1.30* 1.10 1.20  --   GLUCOSE 333*  < > 124* 151* 220* 220*  < > = values in this interval not displayed. Electrolytes  Recent Labs Lab 10/14/14 1335  CALCIUM 9.2   Sepsis Markers No results for input(s): LATICACIDVEN, PROCALCITON, O2SATVEN in the last 168 hours. ABG  Recent Labs Lab 10/16/14 1131 10/16/14 1628 10/16/14 1815  PHART 7.348* 7.255* 7.217*  PCO2ART 51.3* 52.5* 52.0*  PO2ART 398.0* 83.0 61.0*   Liver Enzymes  Recent Labs Lab 10/14/14 1335  AST 36  ALT 16*  ALKPHOS 69   BILITOT 0.8  ALBUMIN 3.7   Cardiac Enzymes No results for input(s): TROPONINI, PROBNP in the last 168 hours. Glucose  Recent Labs Lab 10/14/14 1245 10/16/14 0711  GLUCAP 289* 80    Imaging Dg Chest Port 1 View  10/16/2014   CLINICAL DATA:  Status post CABG and aortic valve replacement today.  EXAM: PORTABLE CHEST - 1 VIEW  COMPARISON:  CT chest 08/12/2014.  PA and lateral chest 10/14/2014.  FINDINGS: Bilateral chest tubes are in place. No pneumothorax identified. Endotracheal tube is seen with the tip at the level of clavicular heads, well above the carina. NG tube courses into the stomach. Right IJ approach Swan-Ganz catheter tip is in the proximal right main pulmonary artery. Mediastinal drain is noted. There is left basilar atelectasis and there is likely a small left effusion. No right effusion is noted. There is no pulmonary edema.  IMPRESSION: Support apparatus projects in good position. Negative for pneumothorax.  Left basilar atelectasis and likely small effusion.   Electronically Signed   By: Inge Rise M.D.   On: 10/16/2014 19:01     ASSESSMENT / PLAN:  PULMONARY OETT 8/17 >>  R chest tube 8/17 >>  L chest tubes 8/17 >>  A: Acute respiratory failure COPD OSA / Obesity hypoventilation syndrome Stable calcified RUL nodule 2 smaller LUL nodules on CT 07/2014, unclear how long they have been present P:   Vent settings reviewed, would increase PEEP to 10 temporarily given his LLL atx Wean O2 as able Scheduled BD's; suspect he needs daily BD as an outpt as well, will try to address this with him this admission.  Will try to obtain his old CT chest from [place], determine if the LUL nodules are new as of our 2016 CT. If so then I will follow with serial films.   CARDIOVASCULAR R IJ CVC + PA-c 8/17 >>  L radial art line 8/17 >>  A: s/p CABG, AVR, Maze Cardiogenic Shock post-op Chronic Systolic and Diastolic CHF Atrial fib Hx HTN P:  Epi, phenylephrine,  milrinone, dopamine gtt's running PA-c in place V-paced  RENAL A:  No acute issues P:   Follow BMP  GASTROINTESTINAL A:  SUP P:   NPO Pepcid  HEMATOLOGIC A:  Thrombocytopenia, etiology unclear. Has received heparin, pepcid. Suspect due to being on Bypass P:  Follow CBC  Consider HITT Ab screen if no rebound  INFECTIOUS A:  No evidence active infection P:   Cefuroxime and vanco surgical prophylaxis 8/17 >> 8/18  ENDOCRINE A:  DM   P:   Currently on insulin gtt  NEUROLOGIC A:  Sedation while ventilated P:   RASS goal: -1 to -2 Precedex, morphine and versed prn   FAMILY  - Updates: no family present 8/17  - Inter-disciplinary family meet or Palliative Care meeting due by:  10/23/14    TODAY'S SUMMARY:  To SICU intubated and sedated post-op CABG, AVR, Maze procedure.     Independent CC time 45 minutes.    Baltazar Apo, MD, PhD 10/16/2014, 7:52 PM [facility] [phone] or if no answer [phone]

## 2014-10-16 NOTE — OR Nursing (Signed)
1713 SICU notified of surgery progress (closing of skin).

## 2014-10-16 NOTE — Brief Op Note (Signed)
10/16/2014      [address]       [place] [address]             [phone]     10/16/2014  2:52 PM  PATIENT:  Charles [name]  74 y.o. male  PRE-OPERATIVE DIAGNOSIS:  aortic stenosis, coronary artery disease, atrial fibrillation  POST-OPERATIVE DIAGNOSIS:  aortic stenosis, coronary artery disease, atrial fibrillation  PROCEDURE:  Procedure(s): AORTIC VALVE REPLACEMENT (AVR)#27 MAGNAEASE BIOPROSTHETIC CORONARY ARTERY BYPASS GRAFTING (CABG) x4 using left internal mammary artery and bilateral greater saphenous veins harvested endoscopically(EVH) LIMA-LAD; SVG-OM;SVG-DIAG2; SVD-PDA MAZE PROCEDURE TRANSESOPHAGEAL ECHOCARDIOGRAM (TEE)  SURGEON:    [name], MD  ASSISTANTS:  Rowe Clack, PA-C  ANESTHESIA:   Karlyne Greenspan, MD  CROSSCLAMP TIME:   218'  CARDIOPULMONARY BYPASS TIME: 275  FINDINGS:  Tricuspid aortic valve with moderate aortic stenosis and mild aortic insufficiency  Mild LV systolic dysfunction, EF 50%  Good quality LIMA conduit for grafting  Good quality SVG conduit for grafting  Diffuse CAD with fair to good quality target vessels for grafting   Aortic Valve  Procedure Performed:  Replacement: Yes.  Bioprosthetic Valve. Implant Model Number:3300TFX, Size:27, Unique Device Identifier:[id number]  Repair/Reconstruction: No.   Aortic Annular Enlargement: No.    Aortic Valve Etiology   Aortic Insufficiency:  Mild  Aortic Valve Disease:  Yes.  Aortic Stenosis:  Yes. Smallest Aortic Valve Area: 1.32cm2; Highest Mean Gradient: .  Etiology (Choose at least one and up to  5 etiologies):  Degenerative - Calcified  Maze Procedure  Surgical Approach: Median sternotomy  Cut-and-sew:  No.  Cryo: Yes  Cryo Lesions (select all that apply):       6  Mitral Valve Cryo Lesion,     10  Tricuspid Cryo Lesion, 16    Radiofrequency:  Yes.  Bipolar: Yes.  RF Lesions (select all that apply):      1   Pulmonary Vein Isolation,     2   Box Lesion,   3a  Inferior Pulmonary Vein Connecting Lesion,   3b  Superior Pulmonary Vein Connecting Lesion,     4  Posterior Mirtal Annular Line,    11  Intercaval Line,   15a  RAA Lateral Wall (Short) and   15b  RAA Lateral Wall to "T" Lesion     Left Atrial Appendage Treatment:    Yes -  epicardial clip    COMPLICATIONS: None  BASELINE WEIGHT: 127 kg  PATIENT DISPOSITION:   TO SICU IN STABLE CONDITION  [name] 10/16/2014 5:25 PM

## 2014-10-16 NOTE — Op Note (Signed)
CARDIOTHORACIC SURGERY OPERATIVE NOTE  Date of Procedure:  10/16/2014  Preoperative Diagnosis:   Moderate Aortic Stenosis  Severe 3-vessel Coronary Artery Disease  Chronic Persistent Atrial Fibrillation  Postoperative Diagnosis: Same  Procedure:    Aortic Valve Replacement  Edwards Magna Ease Pericardial Tissue Valve (size 27mm, model # 3300TFX, serial # [id number])   Coronary Artery Bypass Grafting x 4  Left Internal Mammary Artery to Distal Left Anterior Descending Coronary Artery  Saphenous Vein Graft to Posterior Descending Coronary Artery  Saphenous Vein Graft to Third Obtuse Marginal Branch of Left Circumflex Coronary Artery  Saphenous Vein Graft to Second Diagonal Branch Coronary Artery  Endoscopic Vein Harvest from Bilateral Thighs   Maze Procedure   complete bilateral atrial atrial lesion set using bipolar radiofrequency and cryothermy ablation  clipping of left atrial appendage (Atriclip size 40mm)   Surgeon: Salvatore Decent. Cornelius Moras, MD  Assistant: Rowe Clack, PA-C  Anesthesia: Karlyne Greenspan, MD  Operative Findings:  Tricuspid aortic valve with moderate aortic stenosis and mild aortic insufficiency  Mild LV systolic dysfunction, EF 50%  Good quality LIMA conduit for grafting  Good quality SVG conduit for grafting  Diffuse CAD with fair to good quality target vessels for grafting            BRIEF CLINICAL NOTE AND INDICATIONS FOR SURGERY  Patient is a 74 year old morbidly obese white male with history of aortic stenosis, coronary artery disease, chronic combined systolic and diastolic congestive heart failure, chronic persistent atrial fibrillation, previous stroke, COPD, and obstructive sleep apnea who has been referred for surgical consultation to discuss treatment options for management of aortic stenosis and multivessel coronary artery disease. The patient's history dates back to 1997 when he suffered an acute hemispheric stroke. At the time the  patient lived in [place] where he was hospitalized for 2 weeks and subsequently followed for several years. He moved to [place] approximately 5 years ago and has been followed chronically by Dr. Myrtis Ser, Dr. Delton Coombes, and Dr. Yetta Barre with multiple medical problems. The patient describes a more than 15 year history of progressive symptoms of exertional shortness of breath consistent with chronic combined systolic and diastolic congestive heart failure that is further exacerbated by the presence of underlying chronic lung disease, morbid obesity, and physical deconditioning. The patient was diagnosed with atrial fibrillation in 2012. He was initially anticoagulated using Xarelto but this was discontinued following an episode of gross hematuria. Transthoracic echocardiograms have demonstrated a gradual progression of aortic stenosis and mild left ventricular systolic dysfunction. Echocardiogram performed July 2014 revealed findings consistent with moderate aortic stenosis and ejection fraction estimated 55-60%. Peak velocity across the aortic valve was measured 3.1 m/s at that time, corresponding to a mean transvalvular gradient of 25 mmHg. For a while the patient participated in the outpatient pulmonary rehabilitation program in the joint and some degree of symptomatic improvement. He's had some progression of symptoms of exertional shortness of breath, and 3 months ago he experienced a prolonged episode of chest pain occurring at rest. The patient states that he had tightness across his chest that prolonged for several hours. He ultimately took aspirin and his symptoms resolved. He was seen in follow-up by his primary care physician and a nuclear stress test was ordered. This revealed small scarring at the base of the inferior and inferolateral wall with moderate peri-infarct ischemia and resting ejection fraction estimated 39%. The patient was referred back to Dr. Myrtis Ser for follow-up. Transthoracic  echocardiogram revealed ejection fraction estimated 55% with peak velocity  across the aortic valve approximately 3.3 m/s corresponding to peak and mean transvalvular gradients of 43 and 21 mmHg, respectively. Left heart catheterization revealed severe three-vessel coronary artery disease with moderate aortic stenosis and elevated left ventricular filling pressures. Mean transvalvular gradient was reported 23.4 mmHg. Ejection fraction was estimated 45-50%. Right heart catheterization was not performed. Cardiothoracic surgical consultation was requested.  The patient has been seen in consultation and counseled at length regarding the indications, risks and potential benefits of surgery.  All questions have been answered, and the patient provides full informed consent for the operation as described.    DETAILS OF THE OPERATIVE PROCEDURE  Preparation:  The patient is brought to the operating room on the above mentioned date and central monitoring was established by the anesthesia team including placement of Swan-Ganz catheter and radial arterial line. The patient is placed in the supine position on the operating table.  Intravenous antibiotics are administered. General endotracheal anesthesia is induced uneventfully. A Foley catheter is placed.  Baseline transesophageal echocardiogram was performed.  Findings were notable for moderate aortic stenosis with mild aortic insufficiency.  The aortic valve was tricuspid. There was thickening and calcification involving all 3 cusps of the valve.  The right cusp of the aortic valve was essentially immobile. There was mild left ventricular systolic dysfunction. Ejection fraction was estimated 50%.  The patient's chest, abdomen, both groins, and both lower extremities are prepared and draped in a sterile manner. A time out procedure is performed.   Surgical Approach and Conduit Harvest:  A median sternotomy incision was performed and the left internal mammary  artery is dissected from the chest wall and prepared for bypass grafting. The left internal mammary artery is notably good quality conduit. Simultaneously, greater saphenous vein is obtained from the patient's right thigh using endoscopic vein harvest technique. The saphenous vein is notably small caliber and only one portion was adequate quality conduit. Subsequently the greater saphenous vein is obtained from patient's left thigh using endoscopic vein harvest technique. The greater saphenous vein from the left thigh was good quality conduit.  After removal of the saphenous vein, the small surgical incisions in the lower extremity are closed with absorbable suture. Following systemic heparinization, the left internal mammary artery was transected distally noted to have excellent flow.   Extracorporeal Cardiopulmonary Bypass and Myocardial Protection:  The pericardium as opened. The ascending aorta is normal in appearance.  The right common femoral vein is cannulated using the Seldinger technique and a guidewire advanced into the right atrium using TEE guidance.  The patient is heparinized systemically and the femoral vein cannulated using a 22 Fr long femoral venous cannula.  The ascending aorta is cannulated for cardiopulmonary bypass.  Adequate heparinization is verified.   A retrograde cardioplegia cannula is placed through the right atrium into the coronary sinus.   The entire pre-bypass portion of the operation was notable for stable hemodynamics.  Cardiopulmonary bypass was begun and the surface of the heart is inspected.  Distal target vessels are selected for coronary bypass grafting. The patient has diffuse coronary artery disease.  A second venous cannula is placed directly into the superior vena cava.   A cardioplegia cannula is placed in the ascending aorta.  A temperature probe was placed in the interventricular septum.  The patient is cooled to 32C systemic temperature.  The aortic cross  clamp is applied and cold blood cardioplegia is delivered initially in an antegrade fashion through the aortic root.   Supplemental cardioplegia is given  retrograde through the coronary sinus catheter.  Iced saline slush is applied for topical hypothermia.  The initial cardioplegic arrest is rapid with early diastolic arrest although a large volume of cardioplegia is required.  Repeat doses of cardioplegia are administered intermittently throughout the entire cross clamp portion of the operation through the aortic root, through the coronary sinus catheter, and through the subsequently placed vein grafts in order to maintain completely flat electrocardiogram and septal myocardial temperature below 15C.  Myocardial protection was felt to be excellent.   Maze Procedure (left atrial lesion set - part A - epicardial):  The AtriCure Synergy bipolar radiofrequency ablation clamp is used for all radiofrequency ablation lesions for the maze procedure.  The Atricure CryoICE nitrous oxide cryothermy system is utilized for all cryothermy ablation lesions.   The heart is retracted towards the surgeon's side and the left sided pulmonary veins exposed.  An elliptical ablation lesion is created around the base of the left sided pulmonary veins.  A similar elliptical lesion was created around the base of the left atrial appendage.  The left atrial appendage was obliterated using an Atricure left atrial appendage clip (Atriclip, size 40mm).  The heart was replaced into the pericardial sac.   Coronary Artery Bypass Grafting:   The posterior descending branch of the right coronary artery was grafted using a reversed saphenous vein graft in an end-to-side fashion.  At the site of distal anastomosis the target vessel was fair to good quality and measured approximately 1.5 mm in diameter.  The third obtuse marginal branch of the left circumflex coronary artery was grafted using a reversed saphenous vein graft in an  end-to-side fashion.  At the site of distal anastomosis the target vessel was good quality and measured approximately 1.8 mm in diameter.  The second diagonal branch of the left anterior ascending coronary artery was grafted using a reversed saphenous vein graft in an end-to-side fashion. At the site of distal anastomosis the target vessel was poor quality and measured 1.3 mm in diameter..  The distal left anterior coronary artery was grafted with the left internal mammary artery in an end-to-side fashion.  At the site of distal anastomosis the target vessel was good quality and measured approximately 2.0 mm in diameter.   Maze Procedure (left atrial lesion set - part B - endocardial):  A left atriotomy incision was performed through the interatrial groove and extended partially across the back wall of the left atrium after opening the oblique sinus inferiorly.  The floor of the left atrium and the mitral valve were exposed using a self-retaining retractor.    An ablation lesion was placed around the right sided pulmonary veins using the bipolar clamp with one limb of the clamp along the endocardial surface and one along the epicardial surface posteriorly.  A bipolar ablation lesion was placed across the dome of the left atrium from the cephalad apex of the atriotomy incision to reach the cephalad apex of the elliptical lesion around the left sided pulmonary veins.  A similar bipolar lesion was placed across the back wall of the left atrium from the caudad apex of the atriotomy incision to reach the caudad apex of the elliptical lesion around the left sided pulmonary veins, thereby completing a box.  Finally another bipolar lesion was placed across the back wall of the left atrium from the caudad apex of the atriotomy incision towards the posterior mitral valve annulus.  This lesion was completed along the endocardial surface onto the posterior mitral  annulus with a 3 minute duration cryothermy lesion,  followed by a second cryothermy lesion along the posterior epicardial surface of the left atrium to the coronary sinus.  This completes the entire left side lesion set of the Cox maze procedure.    The left atriotomy incision is closed using a 2 layer closure of running 3-0 proline suture after placing a sump drain across the mitral valve to serve as a left ventricular vent.   Aortic Valve Replacement:  An oblique transverse aortotomy incision was performed.  The aortic valve was inspected and notable for moderate aortic stenosis.  The aortic valve leaflets were excised sharply and the aortic annulus decalcified.  Decalcification was notably straightforward.  The aortic annulus was sized to accept a 27 mm prosthesis.  The aortic root and left ventricle were irrigated with copious cold saline solution.  Aortic valve replacement was performed using interrupted horizontal mattress 2-0 Ethibond pledgeted sutures with pledgets in the subannular position.  An [facility] Ease pericardial tissue valve (size 27 mm, model # 3300TFX, serial # [id number]) was implanted uneventfully. The valve seated appropriately with adequate space beneath the left main and right coronary artery.  The aortotomy was closed using a 2-layer closure of running 4-0 Prolene suture.  All proximal vein graft anastomoses were placed directly to the ascending aorta prior to removal of the aortic cross clamp.  The septal myocardial temperature rose rapidly after reperfusion of the left internal mammary artery graft.  One final dose of warm retrograde "hot shot" cardioplegia was administered through the coronary sinus catheter while all air was evacuated through the aortic root.  The aortic cross clamp was removed after a total cross clamp time of 218 minutes.   Maze Procedure (right atrial lesion set):  The retrograde cardioplegia cannula was removed and the small hole in the right atrium extended a short distance.  The AtriCure Synergy  bipolar radiofrequency ablation clamp is utilized to create a series of linear lesions in the right atrium, each with one limb of the clamp along the endocardial surface and the other along the epicardial surface. The first lesion is placed from the posterior apex of the atriotomy incision and along the lateral wall of the right atrium to reach the lateral aspect of the superior vena cava. A second lesion is placed in the opposite direction from the posterior apex of the atriotomy incision along the lateral wall to reach the lateral aspect of the inferior vena cava. A third lesion is placed from the midportion of the atriotomy incision extending at a right angle to reach the tip of the right atrial appendage. A fourth lesion is placed from the anterior apex of the atriotomy incision in an anterior and inferior direction to reach the acute margin of the heart. Finally, the cryotherapy probe is utilized to complete the right atrial lesion set by placing the probe along the endocardial surface of the right atrium from the anterior apex of the atriotomy incision to reach the tricuspid annulus at the 2:00 position. The right atriotomy incision is closed with a 2 layer closure of running 4-0 Prolene suture.   Procedure Completion:  All proximal and distal coronary anastomoses were inspected for hemostasis and appropriate graft orientation. Epicardial pacing wires are fixed to the right ventricular outflow tract and to the right atrial appendage. The patient is rewarmed to 37C temperature. During rewarming there is some venous bleeding noted from the epicardial surface of the left ventricle on the lateral wall near the  left circumflex obtuse marginal branch distal coronary anastomosis, presumably secondary to retraction.  This is repaired using Teflon felt pledgeted suture.  The aortic and left ventricular vents were removed.  The patient is weaned and disconnected from cardiopulmonary bypass.  The patient's rhythm  at separation from bypass was AV paced.  The patient was weaned from cardioplegic bypass on low dose dopamine and milrinone infusions. Total cardiopulmonary bypass time for the operation was 275 minutes.  Followup transesophageal echocardiogram performed after separation from bypass revealed a well-seated bioprosthetic tissue valve in the aortic position that was functioning normally.  There was no perivalvular leak.  There were otherwise no changes from the preoperative exam.  The aortic and venous cannula were removed uneventfully. Protamine was administered to reverse the anticoagulation. The mediastinum and pleural space were inspected for hemostasis and irrigated with saline solution. The mediastinum and both pleural spaces were drained using 4 chest tubes placed through separate stab incisions inferiorly.  The soft tissues anterior to the aorta were reapproximated loosely. The sternum is closed with double strength sternal wire. The soft tissues anterior to the sternum were closed in multiple layers and the skin is closed with a running subcuticular skin closure.  The post-bypass portion of the operation was notable for stable rhythm and hemodynamics.  However, low-dose epinephrine infusion was added because of persistent hypotension.  The patient received a total of 2 units fresh frozen plasma due to coagulopathy after separation from cardiopulmonary bypass and reversal of heparin with protamine.  Towards the end of the procedure the patient developed progressive hypoxemia with decreased oxygen saturation into the 80s. At this time it was appreciated that the left lung was completely atelectatic. The endotracheal tube was pulled back and the left lung began to ventilate again spontaneously. Oxygen saturations immediately improved.     Disposition:  The patient tolerated the procedure well and is transported to the surgical intensive care in stable condition. There are no intraoperative  complications. All sponge instrument and needle counts are verified correct at completion of the operation.   Salvatore Decent. Cornelius Moras MD 10/16/2014 5:30 PM

## 2014-10-17 ENCOUNTER — Inpatient Hospital Stay (HOSPITAL_COMMUNITY): Payer: Medicare Other

## 2014-10-17 ENCOUNTER — Encounter (HOSPITAL_COMMUNITY): Payer: Self-pay | Admitting: Thoracic Surgery (Cardiothoracic Vascular Surgery)

## 2014-10-17 DIAGNOSIS — I5042 Chronic combined systolic (congestive) and diastolic (congestive) heart failure: Secondary | ICD-10-CM

## 2014-10-17 DIAGNOSIS — I482 Chronic atrial fibrillation: Secondary | ICD-10-CM

## 2014-10-17 LAB — CBC
HCT: 25 % — ABNORMAL LOW (ref 39.0–52.0)
HCT: 23.6 % — ABNORMAL LOW (ref 39.0–52.0)
HEMATOCRIT: 21.2 % — AB (ref 39.0–52.0)
HEMOGLOBIN: 7 g/dL — AB (ref 13.0–17.0)
Hemoglobin: 8.7 g/dL — ABNORMAL LOW (ref 13.0–17.0)
Hemoglobin: 8.2 g/dL — ABNORMAL LOW (ref 13.0–17.0)
MCH: 30.4 pg (ref 26.0–34.0)
MCH: 30.1 pg (ref 26.0–34.0)
MCH: 29.8 pg (ref 26.0–34.0)
MCHC: 33 g/dL (ref 30.0–36.0)
MCHC: 34.8 g/dL (ref 30.0–36.0)
MCHC: 34.7 g/dL (ref 30.0–36.0)
MCV: 92.2 fL (ref 78.0–100.0)
MCV: 86.5 fL (ref 78.0–100.0)
MCV: 85.8 fL (ref 78.0–100.0)
PLATELETS: 92 10*3/uL — AB (ref 150–400)
Platelets: 115 10*3/uL — ABNORMAL LOW (ref 150–400)
Platelets: 90 K/uL — ABNORMAL LOW (ref 150–400)
RBC: 2.3 MIL/uL — ABNORMAL LOW (ref 4.22–5.81)
RBC: 2.89 MIL/uL — ABNORMAL LOW (ref 4.22–5.81)
RBC: 2.75 MIL/uL — ABNORMAL LOW (ref 4.22–5.81)
RDW: 14.1 % (ref 11.5–15.5)
RDW: 14.7 % (ref 11.5–15.5)
RDW: 15.2 % (ref 11.5–15.5)
WBC: 14.4 10*3/uL — ABNORMAL HIGH (ref 4.0–10.5)
WBC: 11.8 10*3/uL — AB (ref 4.0–10.5)
WBC: 11.9 K/uL — ABNORMAL HIGH (ref 4.0–10.5)

## 2014-10-17 LAB — GLUCOSE, CAPILLARY
GLUCOSE-CAPILLARY: 210 mg/dL — AB (ref 65–99)
GLUCOSE-CAPILLARY: 236 mg/dL — AB (ref 65–99)
GLUCOSE-CAPILLARY: 180 mg/dL — AB (ref 65–99)
GLUCOSE-CAPILLARY: 238 mg/dL — AB (ref 65–99)
GLUCOSE-CAPILLARY: 250 mg/dL — AB (ref 65–99)
GLUCOSE-CAPILLARY: 240 mg/dL — AB (ref 65–99)
GLUCOSE-CAPILLARY: 222 mg/dL — AB (ref 65–99)
GLUCOSE-CAPILLARY: 197 mg/dL — AB (ref 65–99)
GLUCOSE-CAPILLARY: 152 mg/dL — AB (ref 65–99)
GLUCOSE-CAPILLARY: 98 mg/dL (ref 65–99)
GLUCOSE-CAPILLARY: 95 mg/dL (ref 65–99)
GLUCOSE-CAPILLARY: 97 mg/dL (ref 65–99)
GLUCOSE-CAPILLARY: 25 mg/dL — AB (ref 65–99)
Glucose-Capillary: 104 mg/dL — ABNORMAL HIGH (ref 65–99)
Glucose-Capillary: 114 mg/dL — ABNORMAL HIGH (ref 65–99)
Glucose-Capillary: 116 mg/dL — ABNORMAL HIGH (ref 65–99)
Glucose-Capillary: 122 mg/dL — ABNORMAL HIGH (ref 65–99)
Glucose-Capillary: 146 mg/dL — ABNORMAL HIGH (ref 65–99)
Glucose-Capillary: 229 mg/dL — ABNORMAL HIGH (ref 65–99)
Glucose-Capillary: 245 mg/dL — ABNORMAL HIGH (ref 65–99)
Glucose-Capillary: 246 mg/dL — ABNORMAL HIGH (ref 65–99)
Glucose-Capillary: 250 mg/dL — ABNORMAL HIGH (ref 65–99)
Glucose-Capillary: 253 mg/dL — ABNORMAL HIGH (ref 65–99)
Glucose-Capillary: 260 mg/dL — ABNORMAL HIGH (ref 65–99)
Glucose-Capillary: 82 mg/dL (ref 65–99)

## 2014-10-17 LAB — CARBOXYHEMOGLOBIN
Carboxyhemoglobin: 1.4 % (ref 0.5–1.5)
METHEMOGLOBIN: 1 % (ref 0.0–1.5)
O2 SAT: 76.6 %
TOTAL HEMOGLOBIN: 6.4 g/dL — AB (ref 13.5–18.0)

## 2014-10-17 LAB — POCT I-STAT 3, ART BLOOD GAS (G3+)
ACID-BASE DEFICIT: 5 mmol/L — AB (ref 0.0–2.0)
ACID-BASE EXCESS: 4 mmol/L — AB (ref 0.0–2.0)
Acid-base deficit: 7 mmol/L — ABNORMAL HIGH (ref 0.0–2.0)
BICARBONATE: 22 meq/L (ref 20.0–24.0)
BICARBONATE: 27.3 meq/L — AB (ref 20.0–24.0)
Bicarbonate: 19.1 meq/L — ABNORMAL LOW (ref 20.0–24.0)
O2 SAT: 88 %
O2 SAT: 97 %
O2 Saturation: 92 %
PO2 ART: 85 mmHg (ref 80.0–100.0)
Patient temperature: 37.1
TCO2: 24 mmol/L (ref 0–100)
TCO2: 20 mmol/L (ref 0–100)
TCO2: 28 mmol/L (ref 0–100)
pCO2 arterial: 51.8 mmHg — ABNORMAL HIGH (ref 35.0–45.0)
pCO2 arterial: 41 mmHg (ref 35.0–45.0)
pCO2 arterial: 35 mmHg (ref 35.0–45.0)
pH, Arterial: 7.238 — ABNORMAL LOW (ref 7.350–7.450)
pH, Arterial: 7.276 — ABNORMAL LOW (ref 7.350–7.450)
pH, Arterial: 7.499 — ABNORMAL HIGH (ref 7.350–7.450)
pO2, Arterial: 65 mmHg — ABNORMAL LOW (ref 80.0–100.0)
pO2, Arterial: 71 mmHg — ABNORMAL LOW (ref 80.0–100.0)

## 2014-10-17 LAB — PREPARE FRESH FROZEN PLASMA
UNIT DIVISION: 0
UNIT DIVISION: 0

## 2014-10-17 LAB — BASIC METABOLIC PANEL
Anion gap: 13 (ref 5–15)
Anion gap: 9 (ref 5–15)
BUN: 22 mg/dL — AB (ref 6–20)
BUN: 21 mg/dL — AB (ref 6–20)
CALCIUM: 7.8 mg/dL — AB (ref 8.9–10.3)
CALCIUM: 8.2 mg/dL — AB (ref 8.9–10.3)
CHLORIDE: 108 mmol/L (ref 101–111)
CO2: 20 mmol/L — AB (ref 22–32)
CO2: 25 mmol/L (ref 22–32)
CREATININE: 1.69 mg/dL — AB (ref 0.61–1.24)
CREATININE: 1.53 mg/dL — AB (ref 0.61–1.24)
Chloride: 107 mmol/L (ref 101–111)
GFR calc Af Amer: 44 mL/min — ABNORMAL LOW (ref >60)
GFR calc non Af Amer: 38 mL/min — ABNORMAL LOW (ref >60)
GFR calc non Af Amer: 43 mL/min — ABNORMAL LOW (ref >60)
GFR, EST AFRICAN AMERICAN: 50 mL/min — AB (ref >60)
GLUCOSE: 253 mg/dL — AB (ref 65–99)
Glucose, Bld: 114 mg/dL — ABNORMAL HIGH (ref 65–99)
Potassium: 2.7 mmol/L — CL (ref 3.5–5.1)
Potassium: 3.3 mmol/L — ABNORMAL LOW (ref 3.5–5.1)
Sodium: 141 mmol/L (ref 135–145)
Sodium: 141 mmol/L (ref 135–145)

## 2014-10-17 LAB — PREPARE PLATELET PHERESIS: UNIT DIVISION: 0

## 2014-10-17 LAB — POCT I-STAT, CHEM 8
BUN: 22 mg/dL — AB (ref 6–20)
CALCIUM ION: 1.09 mmol/L — AB (ref 1.13–1.30)
CREATININE: 1.4 mg/dL — AB (ref 0.61–1.24)
Chloride: 104 mmol/L (ref 101–111)
Glucose, Bld: 109 mg/dL — ABNORMAL HIGH (ref 65–99)
HEMATOCRIT: 24 % — AB (ref 39.0–52.0)
HEMOGLOBIN: 8.2 g/dL — AB (ref 13.0–17.0)
Potassium: 3.7 mmol/L (ref 3.5–5.1)
SODIUM: 141 mmol/L (ref 135–145)
TCO2: 22 mmol/L (ref 0–100)

## 2014-10-17 LAB — MAGNESIUM
Magnesium: 2.8 mg/dL — ABNORMAL HIGH (ref 1.7–2.4)
Magnesium: 2.6 mg/dL — ABNORMAL HIGH (ref 1.7–2.4)
Magnesium: 2.5 mg/dL — ABNORMAL HIGH (ref 1.7–2.4)

## 2014-10-17 LAB — CREATININE, SERUM
Creatinine, Ser: 1.63 mg/dL — ABNORMAL HIGH (ref 0.61–1.24)
GFR calc Af Amer: 46 mL/min — ABNORMAL LOW
GFR calc non Af Amer: 40 mL/min — ABNORMAL LOW

## 2014-10-17 LAB — PREPARE RBC (CROSSMATCH)

## 2014-10-17 MED ORDER — SODIUM CHLORIDE 0.9 % IV SOLN
Freq: Once | INTRAVENOUS | Status: DC
Start: 2014-10-17 — End: 2014-10-19

## 2014-10-17 MED ORDER — SODIUM BICARBONATE 8.4 % IV SOLN
INTRAVENOUS | Status: AC
  Filled 2014-10-17: qty 50

## 2014-10-17 MED ORDER — SODIUM CHLORIDE 0.9 % IV SOLN
Freq: Once | INTRAVENOUS | Status: AC
  Administered 2014-10-17: 17:00:00 via INTRAVENOUS

## 2014-10-17 MED ORDER — POTASSIUM CHLORIDE 10 MEQ/50ML IV SOLN
10.0000 meq | INTRAVENOUS | Status: AC
Start: 2014-10-17 — End: 2014-10-17
  Administered 2014-10-17 (×3): 10 meq via INTRAVENOUS

## 2014-10-17 MED ORDER — POTASSIUM CHLORIDE 10 MEQ/50ML IV SOLN
INTRAVENOUS | Status: AC
  Filled 2014-10-17: qty 150

## 2014-10-17 MED ORDER — HYDRALAZINE HCL 20 MG/ML IJ SOLN
INTRAMUSCULAR | Status: AC
  Filled 2014-10-17: qty 1

## 2014-10-17 MED ORDER — INSULIN DETEMIR 100 UNIT/ML ~~LOC~~ SOLN
36.0000 [IU] | Freq: Two times a day (BID) | SUBCUTANEOUS | Status: DC
  Administered 2014-10-17 (×2): 36 [IU] via SUBCUTANEOUS
  Filled 2014-10-17 (×5): qty 0.36

## 2014-10-17 MED ORDER — LACTATED RINGERS IV SOLN: INTRAVENOUS | Status: DC

## 2014-10-17 MED ORDER — POTASSIUM CHLORIDE 10 MEQ/50ML IV SOLN
10.0000 meq | INTRAVENOUS | Status: AC | PRN
  Administered 2014-10-17 (×3): 10 meq via INTRAVENOUS

## 2014-10-17 MED ORDER — SODIUM BICARBONATE 8.4 % IV SOLN
100.0000 meq | Freq: Once | INTRAVENOUS | Status: AC
  Administered 2014-10-17: 100 meq via INTRAVENOUS
  Filled 2014-10-17: qty 100

## 2014-10-17 MED ORDER — POTASSIUM CHLORIDE 10 MEQ/50ML IV SOLN
10.0000 meq | INTRAVENOUS | Status: AC
  Administered 2014-10-17 (×3): 10 meq via INTRAVENOUS
  Filled 2014-10-17 (×3): qty 50

## 2014-10-17 MED ORDER — POTASSIUM CHLORIDE 10 MEQ/50ML IV SOLN
10.0000 meq | INTRAVENOUS | Status: AC
  Administered 2014-10-17 (×3): 10 meq via INTRAVENOUS

## 2014-10-17 MED FILL — Magnesium Sulfate Inj 50%: INTRAMUSCULAR | Qty: 10 | Status: AC

## 2014-10-17 MED FILL — Potassium Chloride Inj 2 mEq/ML: INTRAVENOUS | Qty: 40 | Status: AC

## 2014-10-17 MED FILL — Heparin Sodium (Porcine) Inj 1000 Unit/ML: INTRAMUSCULAR | Qty: 30 | Status: AC

## 2014-10-17 NOTE — Progress Notes (Signed)
PULMONARY / CRITICAL CARE MEDICINE   Name: Charles [name] MRN: [id number] DOB: 08-16-1940    ADMISSION DATE:  10/16/2014 CONSULTATION DATE:  10/16/14   REFERRING MD :  Dr Cornelius Moras  REASON FOR CONSULTATION: Acute Respiratory Failure  INITIAL PRESENTATION: 74 yo obese man with hx COPD, restrictive lung disease, OSA/ OHS, chronic A fib, CAD with stable angina, Aortic Stenosis. He was admitted for elective CABG + AVR + Maze, performed on 10/16/14. He returned to the ICU intubated and sedated.   STUDIES:  L heart cath 07/18/14 >> severe three-vessel coronary artery disease with moderate aortic stenosis and elevated left ventricular filling pressures. Mean transvalvular gradient was reported 23.4 mmHg. Ejection fraction was estimated 45-50% PFT 08/12/14 >> combined restriction and obstruction resulting in severely decreased FEV1 1.72L (50% pred), positive BD response, restriction on lung volumes, decreased DLCO that almost fully corrects when adjusted for Va  SIGNIFICANT EVENTS: 8/17 > CABG + AVR + Maze  HISTORY OF PRESENT ILLNESS:  Pleasant 74 yo man, followed by me for obesity with OSA/OHS, COPD and restrictive lung disease. He has good CPAP compliance, is not on any scheduled BD's as he has never felt tat he benefits from them in the past. He also has a hx of a RUL pulmonary nodule dating back to 2005. A repeat CT chest 08/12/14 confirmed stability of the calcifying RUL nodule, shows smaller L sided nodules. He has been followed by Dr Myrtis Ser for CAD, moderate AS and A Fib with associated exertional dyspnea and apparent anginal sx, evaluated by Dr Cornelius Moras for management. He underwent CABG + AVR + Maze procedure on 10/16/14. Returns to the SICU post-op. PCCM consulted to assist with his care.   SUBJECTIVE:  There is a loud cuff leak, pt not giving return volumes to vent  VITAL SIGNS: Temp:  [96.1 F (35.6 C)-99.3 F (37.4 C)] 98.8 F (37.1 C) (08/18 0800) Pulse Rate:  [91-96] 96 (08/18 0800) Resp:   [14-23] 17 (08/18 0800) BP: (81-136)/(34-47) 120/38 mmHg (08/18 0800) SpO2:  [93 %-100 %] 97 % (08/18 0800) Arterial Line BP: (82-126)/(32-51) 106/44 mmHg (08/18 0800) FiO2 (%):  [50 %-100 %] 50 % (08/18 0800) Weight:  [136.8 kg (301 lb 9.4 oz)] 136.8 kg (301 lb 9.4 oz) (08/18 0500) HEMODYNAMICS: PAP: (28-45)/(19-28) 41/22 mmHg CO:  [5.1 L/min-7.8 L/min] 7.8 L/min CI:  [2 L/min/m2-3.1 L/min/m2] 3.1 L/min/m2 VENTILATOR SETTINGS: Vent Mode:  [-] PRVC FiO2 (%):  [50 %-100 %] 50 % Set Rate:  [20 bmp] 20 bmp Vt Set:  [650 mL-660 mL] 660 mL PEEP:  [6 cmH20-10 cmH20] 10 cmH20 Plateau Pressure:  [19 cmH20-24 cmH20] 23 cmH20 INTAKE / OUTPUT:  Intake/Output Summary (Last 24 hours) at 10/17/14 0819 Last data filed at 10/17/14 8657  Gross per 24 hour  Intake 10286.15 ml  Output   5665 ml  Net 4621.15 ml    PHYSICAL EXAMINATION: General:  Obese man, sedated, intubated Neuro:  Sedated, grimace with stim HEENT:  ETT, OGT, Pupils 3mm and sluggish Cardiovascular:  Paced, distant but regular Lungs:  Minimal BS heard B, loud cuff leak with each breath Abdomen:  Obese, non-tender, no BS heard Musculoskeletal:  L LE wrapped, no deformities, no significant edema Skin:  No rash  LABS:  CBC  Recent Labs Lab 10/14/14 1335  10/16/14 1447  10/16/14 1815 10/16/14 1824 10/17/14 0317  WBC 8.4  --   --   --  15.8*  --  14.4*  HGB 14.1  < > 9.1*  < >  9.1* 11.2* 7.0*  HCT 43.4  < > 27.3*  < > 28.1* 33.0* 21.2*  PLT 190  --  118*  --  86*  --  115*  < > = values in this interval not displayed. Coag's  Recent Labs Lab 10/14/14 1335 10/16/14 1815  APTT 32 43*  INR 1.13 1.89*   BMET  Recent Labs Lab 10/14/14 1335  10/16/14 1450 10/16/14 1624 10/16/14 1824 10/17/14 0317  NA 140  < > 139 138 139 141  K 4.1  < > 5.0 4.6 4.3 2.7*  CL 102  < > 102 102  --  108  CO2 27  --   --   --   --  20*  BUN 25*  < > 24* 24*  --  22*  CREATININE 1.47*  < > 1.10 1.20  --  1.69*  GLUCOSE 333*  <  > 151* 220* 220* 253*  < > = values in this interval not displayed. Electrolytes  Recent Labs Lab 10/14/14 1335 10/17/14 0317  CALCIUM 9.2 7.8*  MG  --  2.8*   Sepsis Markers No results for input(s): LATICACIDVEN, PROCALCITON, O2SATVEN in the last 168 hours. ABG  Recent Labs Lab 10/16/14 1815 10/16/14 1911 10/17/14 0313  PHART 7.217* 7.290* 7.276*  PCO2ART 52.0* 40.6 41.0  PO2ART 61.0* 74.0* 71.0*   Liver Enzymes  Recent Labs Lab 10/14/14 1335  AST 36  ALT 16*  ALKPHOS 69  BILITOT 0.8  ALBUMIN 3.7   Cardiac Enzymes No results for input(s): TROPONINI, PROBNP in the last 168 hours. Glucose  Recent Labs Lab 10/17/14 0153 10/17/14 0256 10/17/14 0352 10/17/14 0503 10/17/14 0601 10/17/14 0653  GLUCAP 260* 253* 250* 240* 146* 245*    Imaging Dg Chest Port 1 View  10/17/2014   CLINICAL DATA:  CABG and aortic valve replacement.  EXAM: PORTABLE CHEST - 1 VIEW  COMPARISON:  10/16/2014.  FINDINGS: What may represent endotracheal tube tip is at the top of the image at the level of the the thoracic inlet, clinical correlation is suggested. More distal repositioning should be considered if the endotracheal tube has not been removed. NG tube, Swan-Ganz catheter, mediastinal drainage catheter, bilateral chest tubes in stable position. Prior CABG and aortic valve replacement. Stable cardiomegaly. Low lung volumes with mild basilar atelectasis. Left lower lobe infiltrate cannot be excluded. Small left pleural effusion cannot be excluded. No pneumothorax.  IMPRESSION: 1. What may represent the endotracheal tube tip is noted at the top of the image at the level of the thoracic inlet, clinical correlation suggested. More distal repositioning should be considered if the endotracheal tube has not been removed. Remaining lines and tubes in stable position. 2. Low lung volumes with bibasilar atelectasis. Left lower lobe infiltrate and small left pleural effusion cannot be excluded .  Findings stable from prior exam. 3. Prior CABG and aortic valve replacement. Stable cardiomegaly. No pulmonary venous congestion. Critical Value/emergent results were called by telephone at the time of interpretation on 10/17/2014 at 7:37 am to nurse Vita Barley, who verbally acknowledged these results.   Electronically Signed   By: Maisie Fus  Register   On: 10/17/2014 07:41   Dg Chest Port 1 View  10/16/2014   CLINICAL DATA:  Status post CABG and aortic valve replacement today.  EXAM: PORTABLE CHEST - 1 VIEW  COMPARISON:  CT chest 08/12/2014.  PA and lateral chest 10/14/2014.  FINDINGS: Bilateral chest tubes are in place. No pneumothorax identified. Endotracheal tube is seen with the tip at  the level of clavicular heads, well above the carina. NG tube courses into the stomach. Right IJ approach Swan-Ganz catheter tip is in the proximal right main pulmonary artery. Mediastinal drain is noted. There is left basilar atelectasis and there is likely a small left effusion. No right effusion is noted. There is no pulmonary edema.  IMPRESSION: Support apparatus projects in good position. Negative for pneumothorax.  Left basilar atelectasis and likely small effusion.   Electronically Signed   By: Drusilla Kanner M.D.   On: 10/16/2014 19:01   CXR 8/18 reviewed by me > shows L atx, ETT is VERY high, right at the thoracic inlet   ASSESSMENT / PLAN:  PULMONARY OETT 8/17 >>  R chest tube 8/17 >>  L chest tubes 8/17 >>  A: Acute respiratory failure COPD OSA / Obesity hypoventilation syndrome Stable calcified RUL nodule 2 smaller LUL nodules on CT 07/2014, unclear how long they have been present P:   Will replace / advance ETT to eliminate cuff leaking, re-establish PEEP. Will place with direct visualization using FOB Goal decrease PEEP 8/18, but cuff leak will allow him to de-recruit. May have to delay this, leave him on PEEP 10 a bit longer Wean O2 as able Scheduled BD's; suspect he needs daily BD as an outpt as  well, will try to address this with him this admission.  Will try to obtain his old CT chest from [place] 2010, determine if the LUL nodules are new as of our 2016 CT. If so then I will follow with serial films.   CARDIOVASCULAR R IJ CVC + PA-c 8/17 >>  L radial art line 8/17 >>  A: s/p CABG, AVR, Maze Cardiogenic Shock post-op Chronic Systolic and Diastolic CHF Atrial fib Hx HTN P:  Epi, phenylephrine, milrinone, dopamine gtt's running PA-c in place V-paced  RENAL A:  Acute renal insufficiency Hypokalemia  P:   Follow BMP  GASTROINTESTINAL A:  SUP P:   NPO Pepcid, transitioning to PPI on 8/19  HEMATOLOGIC A:  Thrombocytopenia, etiology unclear. Has received heparin, pepcid. Suspect due to being on Bypass, Improving Anemia, acute blood loss P:  PRBC ordered for 8/18 Follow CBC  Consider HITT Ab screen depending on trend  INFECTIOUS A:  No evidence active infection P:   Cefuroxime and vanco surgical prophylaxis 8/17 >> 8/18  ENDOCRINE A:  DM   P:   Currently on insulin gtt  NEUROLOGIC A:  Sedation while ventilated P:   RASS goal: -1 to -2 Precedex, morphine and versed prn   FAMILY  - Updates: no family present 8/17  - Inter-disciplinary family meet or Palliative Care meeting due by:  10/23/14    TODAY'S SUMMARY:  To SICU intubated and sedated post-op CABG, AVR, Maze procedure.      Independent CC time 45 minutes.    Levy Pupa, MD, PhD 10/17/2014, 8:19 AM [facility] [phone] or if no answer [phone]

## 2014-10-17 NOTE — Progress Notes (Signed)
CRITICAL VALUE ALERT  Critical value received:  k-2.7  Date of notification:  10/17/2014  Time of notification:  0356  Critical value read back:Yes.    Nurse who received alert:  Marthann Schiller B  MD notified (1st page):  Potassium replacement protocol initiated, will notify MD on am rounds

## 2014-10-17 NOTE — Procedures (Signed)
Bronchoscopy Procedure Note Charles [name] [id number] 1940/08/05  Procedure: Bronchoscopy Indications: To insure appropriate ETT placement  Procedure Details Consent: Unable to obtain consent because of altered level of consciousness. Time Out: Verified patient identification, verified procedure, site/side was marked, verified correct patient position, special equipment/implants available, medications/allergies/relevent history reviewed, required imaging and test results available.  Performed  In preparation for procedure, bronchoscope lubricated. Sedation: Benzodiazepines  Airway entered and the tip of the ETT was localized to be approximately 6.5-7 cm above the carina, but clearly past the cords. ETT was advanced to 1.5cm above the carina under direct visualization and secured into place 28cm at upper lip. Some scant yellow secretions suctioned   Evaluation Hemodynamic Status: BP stable throughout; O2 sats: stable throughout Patient's Current Condition: stable Specimens:  None Complications: No apparent complications Patient did tolerate procedure well.   Levy Pupa, MD, PhD 10/17/2014, 8:46 AM [facility] [phone] or if no answer [phone]

## 2014-10-17 NOTE — Progress Notes (Addendum)
[address]       Charles [name] [address]             [phone]        CARDIOTHORACIC SURGERY PROGRESS NOTE   R1 Day Post-Op Procedure(s) (LRB): AORTIC VALVE REPLACEMENT (AVR)/CORONARY ARTERY BYPASS GRAFTING (CABG) x4 using left internal mammary artery and bilateral greater saphenous veins harvested laparoscopically (N/A) MAZE (N/A) TRANSESOPHAGEAL ECHOCARDIOGRAM (TEE) (N/A) CLIPPING OF ATRIAL APPENDAGE (N/A)  Subjective: Sedated on vent.  Reportedly has followed commands.  Objective: Vital signs: BP Readings from Last 1 Encounters:  10/17/14 115/40   Pulse Readings from Last 1 Encounters:  10/17/14 95   Resp Readings from Last 1 Encounters:  10/17/14 19   Temp Readings from Last 1 Encounters:  10/17/14 98.8 F (37.1 C)     Hemodynamics: PAP: (28-45)/(19-28) 40/23 mmHg CO:  [5.1 L/min-7.2 L/min] 7.2 L/min CI:  [2 L/min/m2-2.9 L/min/m2] 2.9 L/min/m2  Physical Exam:  Rhythm:   sinus  Breath sounds: Coarse but clear  Heart sounds:  RRR  Incisions:  Dressing dry, intact  Abdomen:  Soft, non-distended, quiet  Extremities:  Warm, well-perfused  Chest tubes:  Low volume thin serosanguinous output, no air leak    Intake/Output from previous day: 08/17 0701 - 08/18 0700 In: 10286.2 [I.V.:6667.2; Blood:2019; IV Piggyback:1600] Out: 9147 [Urine:2195; Emesis/NG output:150; Blood:2600; Chest Tube:720] Intake/Output this shift:    Lab Results:  CBC: Recent Labs  10/16/14 1815 10/16/14 1824 10/17/14 0317  WBC 15.8*  --  14.4*  HGB 9.1* 11.2* 7.0*  HCT 28.1* 33.0* 21.2*  PLT 86*  --  115*    BMET:  Recent Labs  10/14/14 1335  10/16/14 1624 10/16/14 1824 10/17/14 0317  NA 140  < > 138 139 141  K 4.1  < > 4.6 4.3 2.7*  CL 102  < > 102  --  108  CO2 27  --   --   --  20*  GLUCOSE 333*  < > 220* 220* 253*  BUN 25*  < > 24*  --  22*  CREATININE 1.47*  < > 1.20  --  1.69*  CALCIUM 9.2  --   --   --  7.8*  < > = values in this  interval not displayed.   PT/INR:   Recent Labs  10/16/14 1815  LABPROT 21.6*  INR 1.89*    CBG (last 3)   Recent Labs  10/17/14 0503 10/17/14 0601 10/17/14 0653  GLUCAP 240* 146* 245*    ABG    Component Value Date/Time   PHART 7.276* 10/17/2014 0313   PCO2ART 41.0 10/17/2014 0313   PO2ART 71.0* 10/17/2014 0313   HCO3 19.1* 10/17/2014 0313   TCO2 20 10/17/2014 0313   ACIDBASEDEF 7.0* 10/17/2014 0313   O2SAT 92.0 10/17/2014 0313    CXR: Looks remarkably clear.  Mild atelectasis and pulm venous congestion  EKG: NSR w/out acute ischemic changes    Assessment/Plan: S/P Procedure(s) (LRB): AORTIC VALVE REPLACEMENT (AVR)/CORONARY ARTERY BYPASS GRAFTING (CABG) x4 using left internal mammary artery and bilateral greater saphenous veins harvested laparoscopically (N/A) MAZE (N/A) TRANSESOPHAGEAL ECHOCARDIOGRAM (TEE) (N/A) CLIPPING OF ATRIAL APPENDAGE (N/A)  Overall stable POD1 Maintaining NSR w/ stable hemodynamics although still on Epi @ 9 Dop @ 5 and milrinone @ 0.2 Multifactorial acute on chronic respiratory failure, currently maintaining normal gas exchange and O2 sats 98% on PRVC with FiO2=50% and PEEP=10 - CXR looks remarkably clear Expected post op acute blood loss anemia,  Hgb down to 7.0 this morning Metabolic acidosis Hypokalemia, likely primarily secondary to Epi infusion Type II diabetes mellitus, CBG's still > 200 on high dose insulin drip   Transfuse 2 units PRBC's  Wean PEEP then vent per Pulm/CCM team  Decrease milrinone to 0.2  Wean Epi and Neo slowly as tolerated  NaHCO3 x 1 amp  Supplement potassium  Add levemir and continue insulin drip   [name] 10/17/2014 7:35 AM

## 2014-10-17 NOTE — Progress Notes (Signed)
Dr. Cornelius Moras at beside, made aware of pt not responding to stimulation on RUE, acknowlegded, no intervention requested at present time, will continue to monitor patient.  Darrel Hoover 3:55 PM

## 2014-10-17 NOTE — Progress Notes (Signed)
Patient was noted to have a cuff leak.  Cuff pressure was normal.  ETT noted to be at 22 at the lip.  MD performed bronch to check tube placement.  ETT was advanced 6cm to 28 at the lip.  Cuff leak was resolved and bilateral breath sounds noted.  Will continue to monitor.

## 2014-10-17 NOTE — Progress Notes (Signed)
Inpatient Diabetes Program Recommendations  AACE/ADA: New Consensus Statement on Inpatient Glycemic Control (2013)  Target Ranges:  Prepandial:   less than 140 mg/dL      Peak postprandial:   less than 180 mg/dL (1-2 hours)      Critically ill patients:  140 - 180 mg/dL   Inpatient Diabetes Program Recommendations Correction (SSI): add resistant scale Q4 if NPO  Thank you  Piedad Climes BSN, RN,CDE Inpatient Diabetes Coordinator [phone] (team pager)

## 2014-10-17 NOTE — Progress Notes (Signed)
Patient ID: Charles [name], male   DOB: 06/28/1940, 74 y.o.   MRN: [id number] EVENING ROUNDS NOTE :     [address]       [place] [address]             [phone]                 1 Day Post-Op Procedure(s) (LRB): AORTIC VALVE REPLACEMENT (AVR)/CORONARY ARTERY BYPASS GRAFTING (CABG) x4 using left internal mammary artery and bilateral greater saphenous veins harvested laparoscopically (N/A) MAZE (N/A) TRANSESOPHAGEAL ECHOCARDIOGRAM (TEE) (N/A) CLIPPING OF ATRIAL APPENDAGE (N/A)  Total Length of Stay:  LOS: 1 day  BP 113/51 mmHg  Pulse 76  Temp(Src) 99.5 F (37.5 C) (Core (Comment))  Resp 20  Wt 301 lb 9.4 oz (136.8 kg)  SpO2 96%  .Intake/Output      08/18 0701 - 08/19 0700   I.V. (mL/kg) 830.2 (6.1)   Blood 892   NG/GT 270   IV Piggyback 400   Total Intake(mL/kg) 2392.2 (17.5)   Urine (mL/kg/hr) 770 (0.5)   Emesis/NG output 400 (0.2)   Blood    Chest Tube 390 (0.2)   Total Output 1560   Net +832.2         . sodium chloride 20 mL/hr at 10/16/14 1815  . sodium chloride 250 mL (10/16/14 1815)  . sodium chloride    . dexmedetomidine Stopped (10/17/14 1050)  . DOPamine 5 mcg/kg/min (10/17/14 1500)  . EPINEPHrine 4 mg in dextrose 5% 250 mL infusion (16 mcg/mL) Stopped (10/17/14 1050)  . insulin (NOVOLIN-R) infusion 3.9 Units/hr (10/17/14 1900)  . lactated ringers 10 mL/hr at 10/17/14 1238  . milrinone 0.2 mcg/kg/min (10/17/14 1837)  . phenylephrine (NEO-SYNEPHRINE) Adult infusion 80 mcg/min (10/17/14 1500)     Lab Results  Component Value Date   WBC 11.9* 10/17/2014   HGB 8.2* 10/17/2014   HCT 23.6* 10/17/2014   PLT 90* 10/17/2014   GLUCOSE 109* 10/17/2014   CHOL 136 06/05/2014   TRIG 80.0 06/05/2014   HDL 24.20* 06/05/2014   LDLCALC 96 06/05/2014   ALT 16* 10/14/2014   AST 36 10/14/2014   NA 141 10/17/2014   K 3.7 10/17/2014   CL 104 10/17/2014   CREATININE 1.63* 10/17/2014   BUN 22* 10/17/2014   CO2 25 10/17/2014   TSH 1.93  06/05/2014   PSA 1.28 07/19/2013   INR 1.89* 10/16/2014   HGBA1C 8.9* 10/14/2014   MICROALBUR 11.6* 06/05/2014   Remains intubated Left gaze preference and not moving right  Arm and leg- likely stoke when stable will need ct of head   Delight Ovens MD  Beeper [phone] Office [phone] 10/17/2014 7:10 PM

## 2014-10-18 ENCOUNTER — Inpatient Hospital Stay (HOSPITAL_COMMUNITY): Payer: Medicare Other

## 2014-10-18 ENCOUNTER — Encounter (HOSPITAL_COMMUNITY): Payer: Self-pay | Admitting: Thoracic Surgery (Cardiothoracic Vascular Surgery)

## 2014-10-18 DIAGNOSIS — I63412 Cerebral infarction due to embolism of left middle cerebral artery: Secondary | ICD-10-CM

## 2014-10-18 DIAGNOSIS — I350 Nonrheumatic aortic (valve) stenosis: Principal | ICD-10-CM

## 2014-10-18 DIAGNOSIS — I6340 Cerebral infarction due to embolism of unspecified cerebral artery: Secondary | ICD-10-CM

## 2014-10-18 HISTORY — DX: Cerebral infarction due to embolism of unspecified cerebral artery: I63.40

## 2014-10-18 LAB — POCT I-STAT 3, ART BLOOD GAS (G3+)
ACID-BASE EXCESS: 2 mmol/L (ref 0.0–2.0)
Bicarbonate: 24.2 mEq/L — ABNORMAL HIGH (ref 20.0–24.0)
O2 Saturation: 96 %
PH ART: 7.489 — AB (ref 7.350–7.450)
PO2 ART: 75 mmHg — AB (ref 80.0–100.0)
Patient temperature: 37.3
TCO2: 25 mmol/L (ref 0–100)
pCO2 arterial: 32 mmHg — ABNORMAL LOW (ref 35.0–45.0)

## 2014-10-18 LAB — TYPE AND SCREEN
ABO/RH(D): A POS
Antibody Screen: NEGATIVE
UNIT DIVISION: 0
UNIT DIVISION: 0
UNIT DIVISION: 0
UNIT DIVISION: 0

## 2014-10-18 LAB — GLUCOSE, CAPILLARY
GLUCOSE-CAPILLARY: 104 mg/dL — AB (ref 65–99)
GLUCOSE-CAPILLARY: 115 mg/dL — AB (ref 65–99)
GLUCOSE-CAPILLARY: 118 mg/dL — AB (ref 65–99)
GLUCOSE-CAPILLARY: 125 mg/dL — AB (ref 65–99)
GLUCOSE-CAPILLARY: 136 mg/dL — AB (ref 65–99)
GLUCOSE-CAPILLARY: 142 mg/dL — AB (ref 65–99)
Glucose-Capillary: 121 mg/dL — ABNORMAL HIGH (ref 65–99)
Glucose-Capillary: 130 mg/dL — ABNORMAL HIGH (ref 65–99)
Glucose-Capillary: 142 mg/dL — ABNORMAL HIGH (ref 65–99)
Glucose-Capillary: 128 mg/dL — ABNORMAL HIGH (ref 65–99)
Glucose-Capillary: 124 mg/dL — ABNORMAL HIGH (ref 65–99)
Glucose-Capillary: 113 mg/dL — ABNORMAL HIGH (ref 65–99)
Glucose-Capillary: 100 mg/dL — ABNORMAL HIGH (ref 65–99)
Glucose-Capillary: 113 mg/dL — ABNORMAL HIGH (ref 65–99)

## 2014-10-18 LAB — COMPREHENSIVE METABOLIC PANEL
ALBUMIN: 2.9 g/dL — AB (ref 3.5–5.0)
ALK PHOS: 39 U/L (ref 38–126)
ALT: 23 U/L (ref 17–63)
ANION GAP: 8 (ref 5–15)
AST: 136 U/L — AB (ref 15–41)
BILIRUBIN TOTAL: 1.6 mg/dL — AB (ref 0.3–1.2)
BUN: 21 mg/dL — AB (ref 6–20)
CALCIUM: 8 mg/dL — AB (ref 8.9–10.3)
CO2: 26 mmol/L (ref 22–32)
Chloride: 106 mmol/L (ref 101–111)
Creatinine, Ser: 1.5 mg/dL — ABNORMAL HIGH (ref 0.61–1.24)
GFR calc Af Amer: 51 mL/min — ABNORMAL LOW (ref >60)
GFR calc non Af Amer: 44 mL/min — ABNORMAL LOW (ref >60)
GLUCOSE: 120 mg/dL — AB (ref 65–99)
Potassium: 3.9 mmol/L (ref 3.5–5.1)
SODIUM: 140 mmol/L (ref 135–145)
TOTAL PROTEIN: 4.7 g/dL — AB (ref 6.5–8.1)

## 2014-10-18 LAB — CARBOXYHEMOGLOBIN
Carboxyhemoglobin: 1.5 % (ref 0.5–1.5)
Methemoglobin: 1.2 % (ref 0.0–1.5)
O2 Saturation: 68.4 %
Total hemoglobin: 8.7 g/dL — ABNORMAL LOW (ref 13.5–18.0)

## 2014-10-18 LAB — CBC
HEMATOCRIT: 25.7 % — AB (ref 39.0–52.0)
HEMOGLOBIN: 9 g/dL — AB (ref 13.0–17.0)
MCH: 30 pg (ref 26.0–34.0)
MCHC: 35 g/dL (ref 30.0–36.0)
MCV: 85.7 fL (ref 78.0–100.0)
Platelets: 101 10*3/uL — ABNORMAL LOW (ref 150–400)
RBC: 3 MIL/uL — ABNORMAL LOW (ref 4.22–5.81)
RDW: 16.1 % — AB (ref 11.5–15.5)
WBC: 15.6 10*3/uL — ABNORMAL HIGH (ref 4.0–10.5)

## 2014-10-18 MED ORDER — FUROSEMIDE 10 MG/ML IJ SOLN
8.0000 mg/h | INTRAMUSCULAR | Status: DC
  Administered 2014-10-18 – 2014-10-20 (×3): 8 mg/h via INTRAVENOUS
  Filled 2014-10-18 (×8): qty 25

## 2014-10-18 MED ORDER — INSULIN DETEMIR 100 UNIT/ML ~~LOC~~ SOLN
50.0000 [IU] | Freq: Two times a day (BID) | SUBCUTANEOUS | Status: DC
  Administered 2014-10-18 – 2014-10-19 (×4): 50 [IU] via SUBCUTANEOUS
  Filled 2014-10-18 (×6): qty 0.5

## 2014-10-18 MED ORDER — PANTOPRAZOLE SODIUM 40 MG PO PACK
40.0000 mg | PACK | Freq: Every day | ORAL | Status: DC
  Administered 2014-10-18 – 2014-10-22 (×3): 40 mg
  Filled 2014-10-18 (×5): qty 20

## 2014-10-18 MED FILL — Albumin, Human Inj 5%: INTRAVENOUS | Qty: 250 | Status: AC

## 2014-10-18 MED FILL — Electrolyte-R (PH 7.4) Solution: INTRAVENOUS | Qty: 4000 | Status: AC

## 2014-10-18 MED FILL — Sodium Chloride IV Soln 0.9%: INTRAVENOUS | Qty: 3000 | Status: AC

## 2014-10-18 MED FILL — Electrolyte-R (PH 7.4) Solution: INTRAVENOUS | Qty: 5000 | Status: AC

## 2014-10-18 MED FILL — Lidocaine HCl IV Inj 20 MG/ML: INTRAVENOUS | Qty: 5 | Status: AC

## 2014-10-18 MED FILL — Heparin Sodium (Porcine) Inj 1000 Unit/ML: INTRAMUSCULAR | Qty: 10 | Status: AC

## 2014-10-18 MED FILL — Mannitol IV Soln 20%: INTRAVENOUS | Qty: 500 | Status: AC

## 2014-10-18 MED FILL — Sodium Bicarbonate IV Soln 8.4%: INTRAVENOUS | Qty: 50 | Status: AC

## 2014-10-18 NOTE — Progress Notes (Addendum)
[address]       [name] [address]             [phone]        CARDIOTHORACIC SURGERY PROGRESS NOTE   R2 Days Post-Op Procedure(s) (LRB): AORTIC VALVE REPLACEMENT (AVR)/CORONARY ARTERY BYPASS GRAFTING (CABG) x4 using left internal mammary artery and bilateral greater saphenous veins harvested laparoscopically (N/A) MAZE (N/A) TRANSESOPHAGEAL ECHOCARDIOGRAM (TEE) (N/A) CLIPPING OF ATRIAL APPENDAGE (N/A)  Subjective: Still not waking up despite all sedation off 24 hours.  Opens eyes - gaze to the left.  Moves left arm spontaneously but not following commands.  Not moving right side at all.  PERRL  Objective: Vital signs: BP Readings from Last 1 Encounters:  10/18/14 118/53   Pulse Readings from Last 1 Encounters:  10/18/14 90   Resp Readings from Last 1 Encounters:  10/18/14 20   Temp Readings from Last 1 Encounters:  10/18/14 99 F (37.2 C)     Hemodynamics: PAP: (37-51)/(22-35) 48/24 mmHg CO:  [5.9 L/min-8.2 L/min] 8.2 L/min CI:  [2.4 L/min/m2-3.3 L/min/m2] 2.6 L/min/m2  Physical Exam:  Rhythm:   Sinus - AAI pacing  Breath sounds: clear  Heart sounds:  RRR  Incisions:  Dressing dry, intact  Abdomen:  Soft, non-distended, quiet  Extremities:  Warm, well-perfused, swollen  Chest tubes:  Low volume thin serosanguinous output, no air leak    Intake/Output from previous day: 08/18 0701 - 08/19 0700 In: 3493 [I.V.:1901; Blood:892; NG/GT:300; IV Piggyback:400] Out: 2790 [Urine:1700; Emesis/NG output:500; Chest Tube:590] Intake/Output this shift:    Lab Results:  CBC: Recent Labs  10/17/14 1711 10/18/14 0500  WBC 11.9* 15.6*  HGB 8.2* 9.0*  HCT 23.6* 25.7*  PLT 90* 101*    BMET:  Recent Labs  10/17/14 1200 10/17/14 1708 10/17/14 1711 10/18/14 0500  NA 141 141  --  140  K 3.3* 3.7  --  3.9  CL 107 104  --  106  CO2 25  --   --  26  GLUCOSE 114* 109*  --  120*  BUN 21* 22*  --  21*  CREATININE 1.53* 1.40* 1.63*  1.50*  CALCIUM 8.2*  --   --  8.0*     PT/INR:   Recent Labs  10/16/14 1815  LABPROT 21.6*  INR 1.89*    CBG (last 3)   Recent Labs  10/17/14 2124 10/17/14 2230 10/17/14 2336  GLUCAP 130* 125* 136*    ABG    Component Value Date/Time   PHART 7.489* 10/18/2014 0520   PCO2ART 32.0* 10/18/2014 0520   PO2ART 75.0* 10/18/2014 0520   HCO3 24.2* 10/18/2014 0520   TCO2 25 10/18/2014 0520   ACIDBASEDEF 7.0* 10/17/2014 0313   O2SAT 96.0 10/18/2014 0520    CXR: CHEST 1 VIEW  COMPARISON: 10/17/2014.  FINDINGS: Endotracheal tube has been advanced, its is tip is 2.3 cm above the carina. NG tube, Swan-Ganz catheter, mediastinal drainage catheter, bilateral chest tubes in stable position. Prior CABG and aortic valve replacement. Stable cardiomegaly. Low lung volumes with basilar atelectasis. Increased interstitial prominence noted. Mild component congestive heart failure cannot be excluded. Small left pleural effusion again noted. No pneumothorax.  IMPRESSION: 1. Endotracheal tube has been advanced. Its tip is 2.3 cm above the carina. Remaining lines and tubes in stable position. 2. Prior CABG and aortic valve replacement. Persistent cardiomegaly. Increased interstitial markings suggesting mild congestive heart failure. 3. Low lung volumes with bibasilar atelectasis.   Electronically Signed  ByMaisie Fus Register  On: 10/18/2014 07:41  Assessment/Plan: S/P Procedure(s) (LRB): AORTIC VALVE REPLACEMENT (AVR)/CORONARY ARTERY BYPASS GRAFTING (CABG) x4 using left internal mammary artery and bilateral greater saphenous veins harvested laparoscopically (N/A) MAZE (N/A) TRANSESOPHAGEAL ECHOCARDIOGRAM (TEE) (N/A) CLIPPING OF ATRIAL APPENDAGE (N/A)  Overall stable POD2 but patient may have suffered perioperative stroke Maintaining NSR w/ stable hemodynamics on low dose dopamine, milrinone and Neo drips Maintaining normal oxygenation and ventilation on low level vent  support and CXR looks good but neuro status not adequate to consider vent wean/extubation Chronic respiratory insufficiency due to chronic CHF, morbid obesity, OSA and COPD Expected post op volume excess, acute on chronic combined systolic and diastolic CHF Expected post op acute blood loss anemia, Hgb improved after transfusion CKD stage III - creatinine stable slightly above baseline Type II diabetes mellitus, improved glycemic control but still on insulin drip   Brain CT scan  Consult Stroke Team  Monitor neuro status  Add low dose lovenox for DVT prophylaxis if no ICB noted on CT  D/C Swan-ganz  D/C chest tubes  Wean Neo as tolerated  Start lasix drip  Increase Levemir insulin and wean drip as tolerated  Monitor for signs of bowel function and start trickle tube feeds in 1-2 days if unable to wean vent   [name] 10/18/2014 8:20 AM

## 2014-10-18 NOTE — Progress Notes (Signed)
PULMONARY / CRITICAL CARE MEDICINE   Name: Charles [name] MRN: [id number] DOB: [birth year]    ADMISSION DATE:  10/16/2014 CONSULTATION DATE:  10/16/14   REFERRING MD :  Dr Cornelius Moras  REASON FOR CONSULTATION: Acute Respiratory Failure  INITIAL PRESENTATION: 74 yo obese man with hx COPD, restrictive lung disease, OSA/ OHS, chronic A fib, CAD with stable angina, Aortic Stenosis. He was admitted for elective CABG + AVR + Maze, performed on 10/16/14. He returned to the ICU intubated and sedated.   STUDIES:  L heart cath 07/18/14 >> severe three-vessel coronary artery disease with moderate aortic stenosis and elevated left ventricular filling pressures. Mean transvalvular gradient was reported 23.4 mmHg. Ejection fraction was estimated 45-50% PFT 08/12/14 >> combined restriction and obstruction resulting in severely decreased FEV1 1.72L (50% pred), positive BD response, restriction on lung volumes, decreased DLCO that almost fully corrects when adjusted for Va  SIGNIFICANT EVENTS: 8/17 > CABG + AVR + Maze  HISTORY OF PRESENT ILLNESS:  Pleasant 74 yo man, followed by me for obesity with OSA/OHS, COPD and restrictive lung disease. He has good CPAP compliance, is not on any scheduled BD's as he has never felt tat he benefits from them in the past. He also has a hx of a RUL pulmonary nodule dating back to 2005. A repeat CT chest 08/12/14 confirmed stability of the calcifying RUL nodule, shows smaller L sided nodules. He has been followed by Dr Myrtis Ser for CAD, moderate AS and A Fib with associated exertional dyspnea and apparent anginal sx, evaluated by Dr Cornelius Moras for management. He underwent CABG + AVR + Maze procedure on 10/16/14. Returns to the SICU post-op. PCCM consulted to assist with his care.   SUBJECTIVE:  Cuff leak resolved with advancement of ETT on 8/18 Noted 8/18 pm to have R neglect, no R sided movement when sedation lifted.    VITAL SIGNS: Temp:  [98.6 F (37 C)-99.5 F (37.5 C)] 99 F (37.2  C) (08/19 0700) Pulse Rate:  [73-92] 90 (08/19 1130) Resp:  [18-23] 21 (08/19 1130) BP: (104-132)/(43-59) 118/48 mmHg (08/19 1130) SpO2:  [94 %-100 %] 96 % (08/19 1130) Arterial Line BP: (89-137)/(40-56) 131/50 mmHg (08/19 1000) FiO2 (%):  [40 %] 40 % (08/19 1130) Weight:  [140.8 kg (310 lb 6.5 oz)] 140.8 kg (310 lb 6.5 oz) (08/19 0500) HEMODYNAMICS: PAP: (37-51)/(23-35) 48/24 mmHg CO:  [5.9 L/min-8.2 L/min] 8.2 L/min CI:  [2.4 L/min/m2-3.3 L/min/m2] 2.6 L/min/m2 VENTILATOR SETTINGS: Vent Mode:  [-] PSV;CPAP FiO2 (%):  [40 %] 40 % Set Rate:  [20 bmp] 20 bmp Vt Set:  [409 mL] 660 mL PEEP:  [5 cmH20-10 cmH20] 5 cmH20 Pressure Support:  [5 cmH20] 5 cmH20 Plateau Pressure:  [21 cmH20-24 cmH20] 22 cmH20 INTAKE / OUTPUT:  Intake/Output Summary (Last 24 hours) at 10/18/14 1150 Last data filed at 10/18/14 1100  Gross per 24 hour  Intake 2838.36 ml  Output   2870 ml  Net -31.64 ml    PHYSICAL EXAMINATION: General:  Obese man, more awake, intubated Neuro:  Opens eyes, did not follow commands, moved his L UE  but not R.  HEENT:  ETT, OGT, Pupils 3mm and sluggish Cardiovascular:  Paced, distant but regular Lungs:  Distant, decreased at bases esp on the L, no wheeze Abdomen:  Obese, non-tender, no BS heard Musculoskeletal:  L LE wrapped, no deformities, no significant edema Skin:  No rash  LABS:  CBC  Recent Labs Lab 10/17/14 1200 10/17/14 1708 10/17/14 1711 10/18/14 0500  WBC 11.8*  --  11.9* 15.6*  HGB 8.7* 8.2* 8.2* 9.0*  HCT 25.0* 24.0* 23.6* 25.7*  PLT 92*  --  90* 101*   Coag's  Recent Labs Lab 10/14/14 1335 10/16/14 1815  APTT 32 43*  INR 1.13 1.89*   BMET  Recent Labs Lab 10/17/14 0317 10/17/14 1200 10/17/14 1708 10/17/14 1711 10/18/14 0500  NA 141 141 141  --  140  K 2.7* 3.3* 3.7  --  3.9  CL 108 107 104  --  106  CO2 20* 25  --   --  26  BUN 22* 21* 22*  --  21*  CREATININE 1.69* 1.53* 1.40* 1.63* 1.50*  GLUCOSE 253* 114* 109*  --  120*    Electrolytes  Recent Labs Lab 10/17/14 0317 10/17/14 1200 10/17/14 1711 10/18/14 0500  CALCIUM 7.8* 8.2*  --  8.0*  MG 2.8* 2.6* 2.5*  --    Sepsis Markers No results for input(s): LATICACIDVEN, PROCALCITON, O2SATVEN in the last 168 hours. ABG  Recent Labs Lab 10/17/14 0313 10/17/14 1213 10/18/14 0520  PHART 7.276* 7.499* 7.489*  PCO2ART 41.0 35.0 32.0*  PO2ART 71.0* 85.0 75.0*   Liver Enzymes  Recent Labs Lab 10/14/14 1335 10/18/14 0500  AST 36 136*  ALT 16* 23  ALKPHOS 69 39  BILITOT 0.8 1.6*  ALBUMIN 3.7 2.9*   Cardiac Enzymes No results for input(s): TROPONINI, PROBNP in the last 168 hours. Glucose  Recent Labs Lab 10/18/14 0344 10/18/14 0503 10/18/14 0553 10/18/14 0651 10/18/14 0810 10/18/14 0927  GLUCAP 128* 124* 113* 104* 100* 118*    Imaging Dg Chest 1 View  10/18/2014   CLINICAL DATA:  CABG.  Aortic valve replacement.  EXAM: CHEST  1 VIEW  COMPARISON:  10/17/2014.  FINDINGS: Endotracheal tube has been advanced, its is tip is 2.3 cm above the carina. NG tube, Swan-Ganz catheter, mediastinal drainage catheter, bilateral chest tubes in stable position. Prior CABG and aortic valve replacement. Stable cardiomegaly. Low lung volumes with basilar atelectasis. Increased interstitial prominence noted. Mild component congestive heart failure cannot be excluded. Small left pleural effusion again noted. No pneumothorax.  IMPRESSION: 1. Endotracheal tube has been advanced. Its tip is 2.3 cm above the carina. Remaining lines and tubes in stable position. 2. Prior CABG and aortic valve replacement. Persistent cardiomegaly. Increased interstitial markings suggesting mild congestive heart failure. 3. Low lung volumes with bibasilar atelectasis.   Electronically Signed   By: Maisie Fus  Register   On: 10/18/2014 07:41   CXR 8/19 reviewed by me > shows L atx, ETT is in better position  ASSESSMENT / PLAN:  PULMONARY OETT 8/17 >>  R chest tube 8/17 >>  L chest tubes  8/17 >>  A: Acute respiratory failure COPD OSA / Obesity hypoventilation syndrome Stable calcified RUL nodule 2 smaller LUL nodules on CT 07/2014, unclear how long they have been present P:   PEEP to 5, tolerating PSV No plan to extubate pending neuro eval, stabilization of other issues.  Wean O2 as able Scheduled BD's; suspect he needs daily BD as an outpt as well, will try to address this with him this admission.  Once stable, Will try to obtain his old CT chest from [place] 2010, determine if the LUL nodules are new as of our 2016 CT. If so then I will follow with serial films.   CARDIOVASCULAR R IJ CVC + PA-c 8/17 >> 8/19 L radial art line 8/17 >>  A: s/p CABG, AVR, Maze Cardiogenic Shock post-op Chronic Systolic and Diastolic CHF  Atrial fib Hx HTN P:  Weaning cardiac drips Lasix gtt started 8/19 PA-c to be removed 8/19 V-paced  RENAL A:  Acute renal insufficiency Hypokalemia  P:   Follow BMP  GASTROINTESTINAL A:  SUP P:   NPO Pepcid, transitioning to PPI on 8/19  HEMATOLOGIC A:  Thrombocytopenia, etiology unclear. Has received heparin, pepcid. Suspect due to being on Bypass, continues to improve Anemia, acute blood loss, improving P:  PRBC ordered for 8/18 Follow CBC   INFECTIOUS A:  No evidence active infection P:   Cefuroxime and vanco surgical prophylaxis 8/17 >> 8/18  ENDOCRINE A:  DM   P:   Insulin gtt levemir   NEUROLOGIC A:  Sedation while ventilated Suspected peri-op CVA P:   Head Ct scan pending 8/19 Neuro eval pending RASS goal: -1 to -2 Precedex, morphine and versed prn   FAMILY  - Updates: no family present 8/17  - Inter-disciplinary family meet or Palliative Care meeting due by:  10/23/14    TODAY'S SUMMARY:  Post-op CABG, AVR, Maze procedure.  Possibly complicated by CVA. Tolerating PSV but no plan to extubate at this time given other active issues.     Independent CC time 35 minutes.    Levy Pupa, MD, PhD 10/18/2014,  11:50 AM [facility] [phone] or if no answer [phone]

## 2014-10-18 NOTE — Progress Notes (Signed)
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  Prognosis discussed.  Per the patient's well articulated advanced directive, the patient will be considered NO CODE BLUE should his condition deteriorate for some reason causing cardiac and/or respiratory arrest.  Specifically - no CPR - no cardioversion - no reintubation.  However, we will continue to treat him aggressively medically with the hope that he might recover from his stroke.  Will defer to Neurology and Pulm/CCM teams as to whether or not his vent rate should be increased to drive his PCO2 down further.  Continue current level of care at this time.  [name] 10/18/2014 3:58 PM

## 2014-10-18 NOTE — Progress Notes (Signed)
Patient transported to CT and back to room 2S06 without any complications. 

## 2014-10-18 NOTE — Progress Notes (Signed)
Initial Nutrition Assessment  DOCUMENTATION CODES:   Obesity unspecified  INTERVENTION:    If TF started, recommend Vital High Protein formula -- initiate at 20 ml/hr and increase by 10 ml every 4 hours to goal rate of 80 ml/hr  TF regimen to provide 1920 kcals, 168 gm protein, 1605 ml of free water  NUTRITION DIAGNOSIS:   Inadequate oral intake related to inability to eat as evidenced by NPO status  GOAL:   Provide needs based on ASPEN/SCCM guidelines  MONITOR:   Vent status, Labs, Weight trends, I & O's  REASON FOR ASSESSMENT:   Ventilator  ASSESSMENT:   74 yo Male with hx COPD, restrictive lung disease, OSA/ OHS, chronic A fib, CAD with stable angina, Aortic Stenosis. He was admitted for elective CABG + AVR + Maze, performed on 10/16/14. He returned to the ICU intubated and sedated.    Pt currently in CT-IMAGING.  Patient is currently intubated on ventilator support -- OGT in place MV: 10.2 L/min Temp (24hrs), Avg:99.2 F (37.3 C), Min:98.8 F (37.1 C), Max:99.5 F (37.5 C)   RD unable to complete Nutrition Focused Physical Exam at this time.  Diet Order:  Diet NPO time specified  Skin:  Reviewed, no issues  Last BM:  PTA  Height:   Ht Readings from Last 1 Encounters:  10/14/14  (1.88 m)    Weight:   Wt Readings from Last 1 Encounters:  10/18/14 310 lb 6.5 oz (140.8 kg)    Ideal Body Weight:  86.3 kg  BMI:  Body mass index is 39.84 kg/(m^2).  Estimated Nutritional Needs:   Kcal:  1540-1960  Protein:  170-180 gm  Fluid:  per MD  EDUCATION NEEDS:   No education needs identified at this time  Maureen Chatters, RD, LDN Pager #: [phone] After-Hours Pager #: [phone]

## 2014-10-18 NOTE — Consult Note (Signed)
Referring Physician: Roxy Manns    Chief Complaint: Altered mental status  HPI: Charles [name] is an 74 y.o. male with hx COPD, restrictive lung disease, OSA/ OHS, chronic A fib, CAD with stable angina, Aortic Stenosis. He was admitted for elective CABG + AVR + Maze, performed on 10/16/14. He returned to the ICU intubated and sedated. Sedation discontinued approximately 24 hours ago and patient has not regained presurgical mental status.  There has been some spontaneous movement on the left but no movement noted on the right.  He is not following commands.  Consult called for further recommendations.    Date last known well: Date: 10/16/2014 Time last known well: Time: 07:48 tPA Given: No: Outside time window, s/p major surgery  Past Medical History  Diagnosis Date  . Morbid obesity   . Diverticulosis of colon   . Acute pancreatitis     Gallstone pancreatitis  . COPD (chronic obstructive pulmonary disease)     Dr Lamonte Sakai  . OSA (obstructive sleep apnea)     Dr. Lamonte Sakai  . CAD (coronary artery disease)   . Cataract   . Nonexudative senile macular degeneration of retina   . Solitary pulmonary nodule     Dr. Lamonte Sakai    . Hyperlipidemia   . Osteoarthritis   . Anemia   . Diabetes mellitus, type 2   . Hypertension   . Aortic stenosis   . Atrial fibrillation     New diagnosis August 11, 2011  . DOE (dyspnea on exertion)   . Edema   . Chronic combined systolic and diastolic congestive heart failure   . Visual impairment     severe  . Physical deconditioning   . Limited mobility   . Incidental pulmonary nodule, greater than or equal to 71m 08/12/2014    Right lung - [place]  . Dysrhythmia     atrial fib  . Heart murmur   . Stroke 1997    numbness in left hand  . Asthma     child  . S/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  10/16/2014    [facility] bovine bi[facility]rost[facility] tissue valve  . S/P CABG x 4  10/16/2014    LIMA to LAD, SVG to D2, SVG to OM, SVG to PDA, EVH via bilateral thighs  . S/P Maze operation for atrial fibrillation 10/16/2014    Complete bilateral atrial lesion set using cryothermy and bipolar radiofrequency ablation with clipping of LA appendage    Past Surgical History  Procedure Laterality Date  . Cholecystectomy    . Tonsillectomy    . Cardiac catheterization N/A 07/18/2014    Procedure: Left Heart Cath and Coronary Angiography;  Surgeon: MSherren Mocha MD;  [facility];  Service: Cardiovascular;  Laterality: N/A;  . Eye surgery Bilateral   . Aortic valve replacement (avr)/coronary artery bypass grafting (cabg) N/A 10/16/2014    Procedure: AORTIC VALVE REPLACEMENT (AVR)/CORONARY ARTERY BYPASS GRAFTING (CABG) x4 using left internal mammary artery and bilateral greater saphenous veins harvested laparoscopically;  Surgeon: CRexene Alberts MD;  [place] [facility]  Service: Open Heart Surgery;  Laterality: N/A;  . Maze N/A 10/16/2014    Procedure: MAZE;  Surgeon: CRexene Alberts MD;  [place] [facility]  Service: Open Heart Surgery;  Laterality: N/A;  . Tee without cardioversion N/A 10/16/2014    Procedure: TRANSESOPHAGEAL ECHOCARDIOGRAM (TEE);  Surgeon: CRexene Alberts MD;  [place] [facility]  Service: Open Heart Surgery;  Laterality: N/A;  . Clipping of atrial appendage N/A 10/16/2014    Procedure: CLIPPING OF ATRIAL APPENDAGE;  Surgeon: Rexene Alberts, MD;  Location: [facility];  Service: Open Heart Surgery;  Laterality: N/A;    Family History  Problem Relation Age of Onset  . Cancer Father     metastatic  . Heart failure Father   . Heart disease Mother   . Heart attack Mother   . Hypertension Mother   . Diabetes Mother    Social History:  reports that he quit smoking about 24 years ago. His smoking use included Cigarettes. He has a 105 pack-year smoking history. He has never used smokeless tobacco. He reports that he drinks alcohol. He reports that he does  not use illicit drugs.  Allergies: No Known Allergies  Medications:  I have reviewed the patient's current medications. Prior to Admission:  Prescriptions prior to admission  Medication Sig Dispense Refill Last Dose  . Aflibercept 2 MG/0.05ML SOLN Inject into the eye as directed. Injection into right eye every 4 weeks   Past Month at Unknown time  . amiodarone (PACERONE) 200 MG tablet Take 1 tablet (200 mg total) by mouth 2 (two) times daily. 30 tablet 0 10/15/2014 at Unknown time  . aspirin EC 81 MG tablet Take 1 tablet (81 mg total) by mouth daily. 90 tablet 3 10/15/2014 at Unknown time  . atenolol (TENORMIN) 50 MG tablet Take 25 mg by mouth daily. Taking one half tablet (25 mg) by mouth daily   10/15/2014 at Unknown time  . hydrochlorothiazide (HYDRODIURIL) 25 MG tablet Take 25 mg by mouth daily.   10/15/2014 at Unknown time  . Insulin Isophane & Regular Human (HUMULIN 70/30 KWIKPEN) (70-30) 100 UNIT/ML PEN Inject 30 Units into the skin 2 (two) times daily with a meal. 15 mL 11 10/15/2014 at Unknown time  . lisinopril (PRINIVIL,ZESTRIL) 20 MG tablet Take 20 mg by mouth daily.   10/15/2014 at Unknown time  . metFORMIN (GLUCOPHAGE) 1000 MG tablet Take 1,000 mg by mouth 2 (two) times daily with a meal.   10/14/14  . minocycline (MINOCIN,DYNACIN) 100 MG capsule Take 100 mg by mouth 3 (three) times a week.    Past Week at Unknown time  . nitroGLYCERIN (NITROSTAT) 0.4 MG SL tablet Place 1 tablet (0.4 mg total) under the tongue every 5 (five) minutes as needed for chest pain. 50 tablet 3 Taking  . albuterol (PROVENTIL HFA;VENTOLIN HFA) 108 (90 BASE) MCG/ACT inhaler Inhale 2 puffs into the lungs every 6 (six) hours as needed for wheezing or shortness of breath.   More than a month at Unknown time  . B-D INS SYRINGE 0.5CC/30GX1/2" 30G X 1/2" 0.5 ML MISC USE AS DIRECTED. 200 each PRN Taking  . Blood Glucose Monitoring Suppl (TRUETRACK BLOOD GLUCOSE) W/DEVICE KIT Test up to TID Dx: E11.39 1 each 2 Taking  .  glucose blood (TRUETRACK TEST) test strip Test up to TID Dx:E11.39 100 each 12 Taking  . Respiratory Therapy Supplies (FLUTTER) DEVI Use as directed 1 each 0 Taking   Scheduled: . sodium chloride   Intravenous Once  . acetaminophen  1,000 mg Oral 4 times per day   Or  . acetaminophen (TYLENOL) oral liquid 160 mg/5 mL  1,000 mg Per Tube 4 times per day  . antiseptic oral rinse  7 mL Mouth Rinse QID  . aspirin EC  325 mg Oral Daily   Or  . aspirin  324 mg  Per Tube Daily  . bisacodyl  10 mg Rectal Daily  . cefUROXime (ZINACEF)  IV  1.5 g Intravenous Q12H  . chlorhexidine gluconate  15 mL Mouth Rinse BID  . insulin detemir  50 Units Subcutaneous BID  . insulin regular  0-10 Units Intravenous TID WC  . ipratropium-albuterol  3 mL Nebulization Q6H  . pantoprazole sodium  40 mg Per Tube Daily  . sodium chloride  3 mL Intravenous Q12H    ROS: Unable to obtain secondary to mental status  Physical Examination: Blood pressure 118/53, pulse 90, temperature 99 F (37.2 C), temperature source Core (Comment), resp. rate 20, weight 140.8 kg (310 lb 6.5 oz), SpO2 96 %.  HEENT-  Normocephalic, no lesions, without obvious abnormality.  Normal external eye and conjunctiva.  Normal TM's bilaterally.  Normal auditory canals and external ears. Normal external nose, mucus membranes and septum.  Normal pharynx. Cardiovascular- paced, pulses palpable throughout   Lungs- chest clear Abdomen- soft, non-tender; bowel sounds normal; no masses,  no organomegaly Extremities- edematous, right greater than left Lymph-no adenopathy palpable Musculoskeletal-no joint tenderness, deformity or swelling Skin-warm and dry, no hyperpigmentation, vitiligo, or suspicious lesions  Neurological Examination Mental Status: When standing on the left side of the bed and the patient's name is called he seems to alert and direct gaze to examiner.  When name called from the right he does not respond.  Does not follow commands.   No verbalizations noted.  Cranial Nerves: II: patient does not respond confrontation bilaterally, pupils right 3 mm, left 3 mm,and reactive bilaterally III,IV,VI: doll's response present bilaterally but patient with a left gaze preference V,VII: corneal reflex reduced on the right  VIII: unable to test reliably IX,X: gag reflex reduced, XI: trapezius strength unable to test bilaterally XII: tongue strength unable to test Motor: Extremities flaccid on the right with only withdrawal movement noted in the RLE.  Spontaneous movement on the left upper and lower extremity but not against gravity.   Sensory: Does not respond to noxious stimuli in any extremity other than the RLE Deep Tendon Reflexes:  1+ throughout with absent AJ's bilaterally Plantars: upgoing bilaterally Cerebellar: Unable to perform   Laboratory Studies:  Basic Metabolic Panel:  Recent Labs Lab 10/14/14 1335  10/16/14 1624 10/16/14 1824 10/17/14 0317 10/17/14 1200 10/17/14 1708 10/17/14 1711 10/18/14 0500  NA 140  < > 138 139 141 141 141  --  140  K 4.1  < > 4.6 4.3 2.7* 3.3* 3.7  --  3.9  CL 102  < > 102  --  108 107 104  --  106  CO2 27  --   --   --  20* 25  --   --  26  GLUCOSE 333*  < > 220* 220* 253* 114* 109*  --  120*  BUN 25*  < > 24*  --  22* 21* 22*  --  21*  CREATININE 1.47*  < > 1.20  --  1.69* 1.53* 1.40* 1.63* 1.50*  CALCIUM 9.2  --   --   --  7.8* 8.2*  --   --  8.0*  MG  --   --   --   --  2.8* 2.6*  --  2.5*  --   < > = values in this interval not displayed.  Liver Function Tests:  Recent Labs Lab 10/14/14 1335 10/18/14 0500  AST 36 136*  ALT 16* 23  ALKPHOS 69 39  BILITOT 0.8 1.6*  PROT  6.9 4.7*  ALBUMIN 3.7 2.9*   No results for input(s): LIPASE, AMYLASE in the last 168 hours. No results for input(s): AMMONIA in the last 168 hours.  CBC:  Recent Labs Lab 10/16/14 1815  10/17/14 0317 10/17/14 1200 10/17/14 1708 10/17/14 1711 10/18/14 0500  WBC 15.8*  --  14.4* 11.8*   --  11.9* 15.6*  HGB 9.1*  < > 7.0* 8.7* 8.2* 8.2* 9.0*  HCT 28.1*  < > 21.2* 25.0* 24.0* 23.6* 25.7*  MCV 93.4  --  92.2 86.5  --  85.8 85.7  PLT 86*  --  115* 92*  --  90* 101*  < > = values in this interval not displayed.  Cardiac Enzymes: No results for input(s): CKTOTAL, CKMB, CKMBINDEX, TROPONINI in the last 168 hours.  BNP: Invalid input(s): POCBNP  CBG:  Recent Labs Lab 10/17/14 2010 10/17/14 2013 10/17/14 2124 10/17/14 2230 10/17/14 2336  GLUCAP 25* 121* 130* 125* 136*    Microbiology: Results for orders placed or performed during the hospital encounter of 10/14/14  Surgical pcr screen     Status: None   Collection Time: 10/14/14  2:24 PM  Result Value Ref Range Status   MRSA, PCR NEGATIVE NEGATIVE Final   Staphylococcus aureus NEGATIVE NEGATIVE Final    Comment:        The Xpert SA Assay (FDA approved for NASAL specimens in patients over 51 years of age), is one component of a comprehensive surveillance program.  Test performance has been validated by [facility] for patients greater than or equal to 19 year old. It is not intended to diagnose infection nor to guide or monitor treatment.     Coagulation Studies:  Recent Labs  10/16/14 1815  LABPROT 21.6*  INR 1.89*    Urinalysis:  Recent Labs Lab 10/14/14 1354  COLORURINE YELLOW  LABSPEC 1.022  PHURINE 6.5  GLUCOSEU >1000*  HGBUR NEGATIVE  BILIRUBINUR NEGATIVE  KETONESUR NEGATIVE  PROTEINUR NEGATIVE  UROBILINOGEN 1.0  NITRITE NEGATIVE  LEUKOCYTESUR NEGATIVE    Lipid Panel:    Component Value Date/Time   CHOL 136 06/05/2014 1211   TRIG 80.0 06/05/2014 1211   HDL 24.20* 06/05/2014 1211   CHOLHDL 6 06/05/2014 1211   VLDL 16.0 06/05/2014 1211   LDLCALC 96 06/05/2014 1211    HgbA1C:  Lab Results  Component Value Date   HGBA1C 8.9* 10/14/2014    Urine Drug Screen:  No results found for: LABOPIA, COCAINSCRNUR, LABBENZ, AMPHETMU, THCU, LABBARB  Alcohol Level: No results for  input(s): ETH in the last 168 hours.  Other results: EKG: sinus rhythm at 95 bpm, 1st degree AV block, paced.  Imaging: Dg Chest 1 View  10/18/2014   CLINICAL DATA:  CABG.  Aortic valve replacement.  EXAM: CHEST  1 VIEW  COMPARISON:  10/17/2014.  FINDINGS: Endotracheal tube has been advanced, its is tip is 2.3 cm above the carina. NG tube, Swan-Ganz catheter, mediastinal drainage catheter, bilateral chest tubes in stable position. Prior CABG and aortic valve replacement. Stable cardiomegaly. Low lung volumes with basilar atelectasis. Increased interstitial prominence noted. Mild component congestive heart failure cannot be excluded. Small left pleural effusion again noted. No pneumothorax.  IMPRESSION: 1. Endotracheal tube has been advanced. Its tip is 2.3 cm above the carina. Remaining lines and tubes in stable position. 2. Prior CABG and aortic valve replacement. Persistent cardiomegaly. Increased interstitial markings suggesting mild congestive heart failure. 3. Low lung volumes with bibasilar atelectasis.   Electronically Signed   By: Marcello Moores  Register   On: 10/18/2014 07:41   Dg Chest Port 1 View  10/17/2014   CLINICAL DATA:  CABG and aortic valve replacement.  EXAM: PORTABLE CHEST - 1 VIEW  COMPARISON:  10/16/2014.  FINDINGS: What may represent endotracheal tube tip is at the top of the image at the level of the the thoracic inlet, clinical correlation is suggested. More distal repositioning should be considered if the endotracheal tube has not been removed. NG tube, Swan-Ganz catheter, mediastinal drainage catheter, bilateral chest tubes in stable position. Prior CABG and aortic valve replacement. Stable cardiomegaly. Low lung volumes with mild basilar atelectasis. Left lower lobe infiltrate cannot be excluded. Small left pleural effusion cannot be excluded. No pneumothorax.  IMPRESSION: 1. What may represent the endotracheal tube tip is noted at the top of the image at the level of the thoracic  inlet, clinical correlation suggested. More distal repositioning should be considered if the endotracheal tube has not been removed. Remaining lines and tubes in stable position. 2. Low lung volumes with bibasilar atelectasis. Left lower lobe infiltrate and small left pleural effusion cannot be excluded . Findings stable from prior exam. 3. Prior CABG and aortic valve replacement. Stable cardiomegaly. No pulmonary venous congestion. Critical Value/emergent results were called by telephone at the time of interpretation on 10/17/2014 at 7:37 am to nurse Altamese Dilling, who verbally acknowledged these results.   Electronically Signed   By: Marcello Moores  Register   On: 10/17/2014 07:41   Dg Chest Port 1 View  10/16/2014   CLINICAL DATA:  Status post CABG and aortic valve replacement today.  EXAM: PORTABLE CHEST - 1 VIEW  COMPARISON:  CT chest 08/12/2014.  PA and lateral chest 10/14/2014.  FINDINGS: Bilateral chest tubes are in place. No pneumothorax identified. Endotracheal tube is seen with the tip at the level of clavicular heads, well above the carina. NG tube courses into the stomach. Right IJ approach Swan-Ganz catheter tip is in the proximal right main pulmonary artery. Mediastinal drain is noted. There is left basilar atelectasis and there is likely a small left effusion. No right effusion is noted. There is no pulmonary edema.  IMPRESSION: Support apparatus projects in good position. Negative for pneumothorax.  Left basilar atelectasis and likely small effusion.   Electronically Signed   By: Inge Rise M.D.   On: 10/16/2014 19:01    Assessment: 74 y.o. male s/p elective CABG, AVR, and Maze who is not returning to baseline mental status after surgery despite discontinuation of sedation.  On neurological examination patient does not follow commands, has a left gaze preference and right hemiparesis.  Concern is for an ischemic event.  Head CT pending.  TEE performed on 8/17 shows no intracardiac masses or thrombi and  no PFO.  EF 40-45%.  Pre-CABG doppler on 8/15 shows left 60-79% stenosis and right 40-59% stenosis.  A1c 0n 8/15 - 8.9.  LDL on 06/05/14 - 96.  Patient on ASA.    Stroke Risk Factors - atrial fibrillation, diabetes mellitus, hyperlipidemia and hypertension  Plan: 1. Telemetry monitoring 2. Frequent neuro checks 3. Will continue to follow with you and follow up imaging results.     Alexis Goodell, MD [facility] [phone] 10/18/2014, 9:11 AM  Addendum: Head CT independently reviewed and shows a large left MCA infarct with some associated midline shift and pressure on the left lateral ventricle.  Patient in sinus rhythm.  Would continue ASA.  Stroke team to follow up in [place], MD [facility] [phone]

## 2014-10-18 NOTE — Progress Notes (Signed)
CT surgery p.m. Rounds  Patient intubated, stable hemodynamics Head CT scan shows large left middle cerebral artery distribution stroke Not moving right side

## 2014-10-19 ENCOUNTER — Inpatient Hospital Stay (HOSPITAL_COMMUNITY): Payer: Medicare Other

## 2014-10-19 ENCOUNTER — Encounter (HOSPITAL_COMMUNITY): Payer: Medicare Other

## 2014-10-19 DIAGNOSIS — Z9889 Other specified postprocedural states: Secondary | ICD-10-CM

## 2014-10-19 DIAGNOSIS — E1159 Type 2 diabetes mellitus with other circulatory complications: Secondary | ICD-10-CM

## 2014-10-19 DIAGNOSIS — R739 Hyperglycemia, unspecified: Secondary | ICD-10-CM

## 2014-10-19 DIAGNOSIS — Z954 Presence of other heart-valve replacement: Secondary | ICD-10-CM

## 2014-10-19 DIAGNOSIS — Z951 Presence of aortocoronary bypass graft: Secondary | ICD-10-CM

## 2014-10-19 LAB — POCT I-STAT, CHEM 8
BUN: 27 mg/dL — AB (ref 6–20)
CHLORIDE: 98 mmol/L — AB (ref 101–111)
CREATININE: 1.4 mg/dL — AB (ref 0.61–1.24)
Calcium, Ion: 0.99 mmol/L — ABNORMAL LOW (ref 1.13–1.30)
Glucose, Bld: 135 mg/dL — ABNORMAL HIGH (ref 65–99)
HEMATOCRIT: 26 % — AB (ref 39.0–52.0)
Hemoglobin: 8.8 g/dL — ABNORMAL LOW (ref 13.0–17.0)
POTASSIUM: 3 mmol/L — AB (ref 3.5–5.1)
Sodium: 139 mmol/L (ref 135–145)
TCO2: 26 mmol/L (ref 0–100)

## 2014-10-19 LAB — GLUCOSE, CAPILLARY
GLUCOSE-CAPILLARY: 103 mg/dL — AB (ref 65–99)
GLUCOSE-CAPILLARY: 99 mg/dL (ref 65–99)
GLUCOSE-CAPILLARY: 110 mg/dL — AB (ref 65–99)
GLUCOSE-CAPILLARY: 111 mg/dL — AB (ref 65–99)
GLUCOSE-CAPILLARY: 105 mg/dL — AB (ref 65–99)
GLUCOSE-CAPILLARY: 103 mg/dL — AB (ref 65–99)
GLUCOSE-CAPILLARY: 105 mg/dL — AB (ref 65–99)
Glucose-Capillary: 100 mg/dL — ABNORMAL HIGH (ref 65–99)
Glucose-Capillary: 110 mg/dL — ABNORMAL HIGH (ref 65–99)
Glucose-Capillary: 112 mg/dL — ABNORMAL HIGH (ref 65–99)
Glucose-Capillary: 115 mg/dL — ABNORMAL HIGH (ref 65–99)
Glucose-Capillary: 117 mg/dL — ABNORMAL HIGH (ref 65–99)
Glucose-Capillary: 134 mg/dL — ABNORMAL HIGH (ref 65–99)
Glucose-Capillary: 135 mg/dL — ABNORMAL HIGH (ref 65–99)
Glucose-Capillary: 81 mg/dL (ref 65–99)
Glucose-Capillary: 94 mg/dL (ref 65–99)
Glucose-Capillary: 99 mg/dL (ref 65–99)

## 2014-10-19 LAB — POCT I-STAT 3, ART BLOOD GAS (G3+)
ACID-BASE EXCESS: 6 mmol/L — AB (ref 0.0–2.0)
BICARBONATE: 29.7 meq/L — AB (ref 20.0–24.0)
O2 SAT: 96 %
TCO2: 31 mmol/L (ref 0–100)
pCO2 arterial: 39.1 mmHg (ref 35.0–45.0)
pH, Arterial: 7.489 — ABNORMAL HIGH (ref 7.350–7.450)
pO2, Arterial: 78 mmHg — ABNORMAL LOW (ref 80.0–100.0)

## 2014-10-19 LAB — CARBOXYHEMOGLOBIN
Carboxyhemoglobin: 1.4 % (ref 0.5–1.5)
Methemoglobin: 1.1 % (ref 0.0–1.5)
O2 Saturation: 71.9 %
TOTAL HEMOGLOBIN: 8.5 g/dL — AB (ref 13.5–18.0)

## 2014-10-19 LAB — BASIC METABOLIC PANEL
Anion gap: 12 (ref 5–15)
BUN: 24 mg/dL — AB (ref 6–20)
CHLORIDE: 101 mmol/L (ref 101–111)
CO2: 26 mmol/L (ref 22–32)
CREATININE: 1.51 mg/dL — AB (ref 0.61–1.24)
Calcium: 7.7 mg/dL — ABNORMAL LOW (ref 8.9–10.3)
GFR calc Af Amer: 50 mL/min — ABNORMAL LOW (ref >60)
GFR calc non Af Amer: 43 mL/min — ABNORMAL LOW (ref >60)
GLUCOSE: 105 mg/dL — AB (ref 65–99)
Potassium: 3.1 mmol/L — ABNORMAL LOW (ref 3.5–5.1)
Sodium: 139 mmol/L (ref 135–145)

## 2014-10-19 LAB — CBC
HEMATOCRIT: 26.6 % — AB (ref 39.0–52.0)
HEMOGLOBIN: 9 g/dL — AB (ref 13.0–17.0)
MCH: 29.9 pg (ref 26.0–34.0)
MCHC: 33.8 g/dL (ref 30.0–36.0)
MCV: 88.4 fL (ref 78.0–100.0)
Platelets: 93 10*3/uL — ABNORMAL LOW (ref 150–400)
RBC: 3.01 MIL/uL — ABNORMAL LOW (ref 4.22–5.81)
RDW: 16.3 % — ABNORMAL HIGH (ref 11.5–15.5)
WBC: 13.8 10*3/uL — ABNORMAL HIGH (ref 4.0–10.5)

## 2014-10-19 MED ORDER — POTASSIUM CHLORIDE 10 MEQ/50ML IV SOLN
10.0000 meq | INTRAVENOUS | Status: AC
  Administered 2014-10-19 (×3): 10 meq via INTRAVENOUS
  Filled 2014-10-19 (×4): qty 50

## 2014-10-19 MED ORDER — POTASSIUM CHLORIDE 10 MEQ/50ML IV SOLN
10.0000 meq | INTRAVENOUS | Status: AC | PRN
  Administered 2014-10-19 (×3): 10 meq via INTRAVENOUS

## 2014-10-19 MED ORDER — INSULIN ASPART 100 UNIT/ML ~~LOC~~ SOLN
0.0000 [IU] | SUBCUTANEOUS | Status: DC
  Administered 2014-10-19 (×2): 2 [IU] via SUBCUTANEOUS
  Administered 2014-10-21: 4 [IU] via SUBCUTANEOUS
  Administered 2014-10-21 (×2): 2 [IU] via SUBCUTANEOUS
  Administered 2014-10-22 (×4): 4 [IU] via SUBCUTANEOUS
  Administered 2014-10-22: 8 [IU] via SUBCUTANEOUS

## 2014-10-19 MED ORDER — AMIODARONE LOAD VIA INFUSION
150.0000 mg | Freq: Once | INTRAVENOUS | Status: AC
  Administered 2014-10-19: 150 mg via INTRAVENOUS
  Filled 2014-10-19: qty 83.34

## 2014-10-19 MED ORDER — POTASSIUM CHLORIDE 10 MEQ/50ML IV SOLN
10.0000 meq | Freq: Once | INTRAVENOUS | Status: AC
  Administered 2014-10-19: 10 meq via INTRAVENOUS

## 2014-10-19 MED ORDER — AMIODARONE HCL IN DEXTROSE 360-4.14 MG/200ML-% IV SOLN
60.0000 mg/h | INTRAVENOUS | Status: AC
  Administered 2014-10-19 (×2): 60 mg/h via INTRAVENOUS
  Filled 2014-10-19 (×2): qty 200

## 2014-10-19 MED ORDER — POTASSIUM CHLORIDE 10 MEQ/50ML IV SOLN
INTRAVENOUS | Status: AC
Start: 2014-10-19 — End: 2014-10-19
  Administered 2014-10-19: 10 meq via INTRAVENOUS
  Filled 2014-10-19: qty 150

## 2014-10-19 MED ORDER — AMIODARONE HCL IN DEXTROSE 360-4.14 MG/200ML-% IV SOLN
30.0000 mg/h | INTRAVENOUS | Status: DC
Start: 2014-10-20 — End: 2014-10-22
  Administered 2014-10-20: 30 mg/h via INTRAVENOUS
  Filled 2014-10-19 (×10): qty 200

## 2014-10-19 NOTE — Progress Notes (Signed)
3 Days Post-Op Procedure(s) (LRB): AORTIC VALVE REPLACEMENT (AVR)/CORONARY ARTERY BYPASS GRAFTING (CABG) x4 using left internal mammary artery and bilateral greater saphenous veins harvested laparoscopically (N/A) MAZE (N/A) TRANSESOPHAGEAL ECHOCARDIOGRAM (TEE) (N/A) CLIPPING OF ATRIAL APPENDAGE (N/A) Subjective: Patient more active today less obtunded Chest x-ray clear and hemodynamics stable Will attempt ventilator weaning Continue IV Lasix and renal dose dopamine for diuresis Right-sided hemi- paresis persists pbjective: Vital signs in last 24 hours: Temp:  [98.2 F (36.8 C)-100.4 F (38 C)] 98.7 F (37.1 C) (08/20 1200) Pulse Rate:  [77-92] 79 (08/20 1200) Cardiac Rhythm:  [-] Normal sinus rhythm (08/20 1200) Resp:  [14-27] 18 (08/20 1200) BP: (91-164)/(38-78) 136/55 mmHg (08/20 1200) SpO2:  [90 %-100 %] 100 % (08/20 1200) Arterial Line BP: (83-161)/(35-57) 144/51 mmHg (08/20 1200) FiO2 (%):  [40 %] 40 % (08/20 1200) Weight:  [301 lb 2.4 oz (136.6 kg)] 301 lb 2.4 oz (136.6 kg) (08/20 0400)  Hemodynamic parameters for last 24 hours:   stable, sinus rhythm  Intake/Output from previous day: 08/19 0701 - 08/20 0700 In: 1910.1 [I.V.:1390.1; NG/GT:320; IV Piggyback:200] Out: 5840 [Urine:5390; Emesis/NG output:50; Chest Tube:400] Intake/Output this shift: Total I/O In: 364.1 [I.V.:184.1; NG/GT:130; IV Piggyback:50] Out: 1315 [Urine:1225; Chest Tube:90]    Lab Results:  Recent Labs  10/18/14 0500 10/19/14 0518  WBC 15.6* 13.8*  HGB 9.0* 9.0*  HCT 25.7* 26.6*  PLT 101* 93*   BMET:  Recent Labs  10/18/14 0500 10/19/14 0518  NA 140 139  K 3.9 3.1*  CL 106 101  CO2 26 26  GLUCOSE 120* 105*  BUN 21* 24*  CREATININE 1.50* 1.51*  CALCIUM 8.0* 7.7*    PT/INR:  Recent Labs  10/16/14 1815  LABPROT 21.6*  INR 1.89*   ABG    Component Value Date/Time   PHART 7.489* 10/19/2014 1142   HCO3 29.7* 10/19/2014 1142   TCO2 31 10/19/2014 1142   ACIDBASEDEF 7.0*  10/17/2014 0313   O2SAT 96.0 10/19/2014 1142   CBG (last 3)   Recent Labs  10/19/14 0428 10/19/14 0843 10/19/14 1145  GLUCAP 103* 105* 117*    Assessment/Plan: S/P Procedure(s) (LRB): AORTIC VALVE REPLACEMENT (AVR)/CORONARY ARTERY BYPASS GRAFTING (CABG) x4 using left internal mammary artery and bilateral greater saphenous veins harvested laparoscopically (N/A) MAZE (N/A) TRANSESOPHAGEAL ECHOCARDIOGRAM (TEE) (N/A) CLIPPING OF ATRIAL APPENDAGE (N/A) Attempt pressure support ventilator wean Continue diuresis Continue close neurological observation following stroke   LOS: 3 days    Charles [name] 10/19/2014

## 2014-10-19 NOTE — Progress Notes (Signed)
STROKE TEAM PROGRESS NOTE   HISTORY Charles [name] is a 74 y.o. male with hx COPD, restrictive lung disease, OSA/ OHS, chronic A fib, CAD with stable angina, Aortic Stenosis. He was admitted for elective CABG + AVR + Maze, performed on 10/16/14. He returned to the ICU intubated and sedated. Sedation discontinued approximately 24 hours ago and patient has not regained presurgical mental status. There has been some spontaneous movement on the left but no movement noted on the right. He is not following commands. Consult called for further recommendations.   Date last known well: Date: 10/16/2014 Time last known well: Time: 07:48 tPA Given: No: Outside time window, s/p major surgery   SUBJECTIVE (INTERVAL HISTORY) His RN is at the bedside. His eyes open, still has right neglect and hemiplegia. Not change of mental status. Will repeat CT head in am.    OBJECTIVE Temp:  [98.2 F (36.8 C)-100.4 F (38 C)] 98.2 F (36.8 C) (08/20 0400) Pulse Rate:  [81-92] 82 (08/20 0700) Cardiac Rhythm:  [-] Atrial paced (08/20 0600) Resp:  [19-27] 20 (08/20 0700) BP: (102-164)/(45-74) 149/61 mmHg (08/20 0700) SpO2:  [90 %-100 %] 96 % (08/20 0700) Arterial Line BP: (83-161)/(35-57) 140/48 mmHg (08/20 0700) FiO2 (%):  [40 %] 40 % (08/20 0353) Weight:  [136.6 kg (301 lb 2.4 oz)] 136.6 kg (301 lb 2.4 oz) (08/20 0400)   Recent Labs Lab 10/18/14 2054 10/18/14 2201 10/18/14 2305 10/19/14 0013 10/19/14 0428  GLUCAP 105* 100* 94 81 103*    Recent Labs Lab 10/14/14 1335  10/17/14 0317 10/17/14 1200 10/17/14 1708 10/17/14 1711 10/18/14 0500 10/19/14 0518  NA 140  < > 141 141 141  --  140 139  K 4.1  < > 2.7* 3.3* 3.7  --  3.9 3.1*  CL 102  < > 108 107 104  --  106 101  CO2 27  --  20* 25  --   --  26 26  GLUCOSE 333*  < > 253* 114* 109*  --  120* 105*  BUN 25*  < > 22* 21* 22*  --  21* 24*  CREATININE 1.47*  < > 1.69* 1.53* 1.40* 1.63* 1.50* 1.51*  CALCIUM 9.2  --  7.8* 8.2*  --   --   8.0* 7.7*  MG  --   --  2.8* 2.6*  --  2.5*  --   --   < > = values in this interval not displayed.  Recent Labs Lab 10/14/14 1335 10/18/14 0500  AST 36 136*  ALT 16* 23  ALKPHOS 69 39  BILITOT 0.8 1.6*  PROT 6.9 4.7*  ALBUMIN 3.7 2.9*    Recent Labs Lab 10/17/14 0317 10/17/14 1200 10/17/14 1708 10/17/14 1711 10/18/14 0500 10/19/14 0518  WBC 14.4* 11.8*  --  11.9* 15.6* 13.8*  HGB 7.0* 8.7* 8.2* 8.2* 9.0* 9.0*  HCT 21.2* 25.0* 24.0* 23.6* 25.7* 26.6*  MCV 92.2 86.5  --  85.8 85.7 88.4  PLT 115* 92*  --  90* 101* 93*   No results for input(s): CKTOTAL, CKMB, CKMBINDEX, TROPONINI in the last 168 hours.  Recent Labs  10/16/14 1815  LABPROT 21.6*  INR 1.89*   No results for input(s): COLORURINE, LABSPEC, PHURINE, GLUCOSEU, HGBUR, BILIRUBINUR, KETONESUR, PROTEINUR, UROBILINOGEN, NITRITE, LEUKOCYTESUR in the last 72 hours.  Invalid input(s): APPERANCEUR     Component Value Date/Time   CHOL 136 06/05/2014 1211   TRIG 80.0 06/05/2014 1211   HDL 24.20* 06/05/2014 1211   CHOLHDL 6  06/05/2014 1211   VLDL 16.0 06/05/2014 1211   LDLCALC 96 06/05/2014 1211   Lab Results  Component Value Date   HGBA1C 8.9* 10/14/2014   No results found for: LABOPIA, COCAINSCRNUR, LABBENZ, AMPHETMU, THCU, LABBARB  No results for input(s): ETH in the last 168 hours.   IMAGING  Dg Chest 1 View 10/18/2014    1. Endotracheal tube has been advanced. Its tip is 2.3 cm above the carina. Remaining lines and tubes in stable position.  2. Prior CABG and aortic valve replacement. Persistent cardiomegaly. Increased interstitial markings suggesting mild congestive heart failure.  3. Low lung volumes with bibasilar atelectasis.     Ct Head Wo Contrast 10/18/2014    Acute infarct involving essentially the entire left middle cerebral artery distribution with partial effacement of the left lateral ventricle. There is increased attenuation in the left middle cerebral artery consistent with thrombosis  of this vessel. No midline shift. No hemorrhage. There is mild underlying atrophy with mild periventricular small vessel disease.    TEE Intraoperative   10/16/2014 Impressions: - LV systolic function baseline to mildly diminished, Right heart with moderate dysfunction, mild MR at baseline, no AI with well seated and function AV, no PFO or new ASD, no signs of dissection, TV with no TR, PAC in main PA, no signs of significant pleural effusion.  CT repeat pending  CUS - pending  PHYSICAL EXAM  Temp:  [98.2 F (36.8 C)-100.4 F (38 C)] 98.7 F (37.1 C) (08/20 0803) Pulse Rate:  [81-92] 86 (08/20 0803) Resp:  [18-27] 18 (08/20 0803) BP: (102-164)/(45-74) 150/56 mmHg (08/20 0803) SpO2:  [90 %-100 %] 100 % (08/20 0803) Arterial Line BP: (83-161)/(35-57) 140/48 mmHg (08/20 0700) FiO2 (%):  [40 %] 40 % (08/20 0803) Weight:  [301 lb 2.4 oz (136.6 kg)] 301 lb 2.4 oz (136.6 kg) (08/20 0400)  General - Well nourished, well developed, intubated not following commands.  Ophthalmologic - fundi not visualized due to noncooperation.  Cardiovascular - Regular rate and rhythm, not in afib.  Neuro - awake, intubated and not following commands. Right visual neglect, blinking to visual threat on the left but not on the right, PERRL, eyes left gaze preference and barely cross to midline. Right nasolabial fold flattening. RUE flaccid, 0/5 on pain stimulatin, RLE flaccid but increased muscle tone, trace withdraw on pain stimulatin. LUE and LLE spontaneous movement. Right babinski positive.  ASSESSMENT/PLAN [name] is a 74 y.o. male with history of diabetes mellitus, hypertension, copd, aortic stenosis, atrial fibrillation (Xarelto in the past DC'd secondary to hematuria), visual impairment, pulmonary nodule, previous stroke in 1997, asthma, and elective CABG + AVR + Maze, performed on 10/16/14.  presenting with altered mental status and right hemiplegia. He did not receive IV t-PA  due to recent surgery.  Stroke:  Left MCA large infarct - embolic from known Afib not on [facility] or cardiac procedure related.  Resultant  Aphasia, right neglect and right hemiplegia.  MRI  not needed as it will not change management  MRA  not needed as it will not change management  CUS - 40-59% RICA stensosis. 60-79% LICA stenosis. Right vertebral antegrade. Left vertebral flow not found.  Recommend CTA neck once Cre normalizes  Repeat CT in am to evaluate brain edema (ordered)  TEE intraoperative EF 40 -45%. No PFO or cardiac clot.  LDL 96  HgbA1c 8.9  SCDs for VTE prophylaxis  Diet NPO time specified  aspirin 81 mg orally every day prior to admission,  now on aspirin 325 mg orally every day. Due to afib, pt should be on [facility] for stroke prevention. Currently pt has large MCA infarct, to avoid hemorrhagic transformation, will recommend start AC at 10 days post stroke if not contraindicated. Due to valvular afib, coumadin may be the choice for anticoagulation. Recommend start coumadin at day 7, no need bridge and with expection to reach INR 2-3 at day 10 post stroke.   Ongoing aggressive stroke risk factor management  Therapy recommendations:  Pending  Disposition:  Pending  Cerebral edema  Mass effect on CT with ventrical compression  No mid line shift on last CT  Repeat CT in am to evaluate cerebral edema  Has central line placed  May consider 3% saline if needed  Chronic afib  Not on [facility] but on ASA at home  Has home amiodarone  Last INR check 10/16/14 - 1.89 - will repeat in am  Due to high CHA2DS2-VASc, recommend anticoagulation at 7-10 days after stroke to avoid hemorrhagic transformation as the left MCA stroke is large. Recommend coumadin at day 7 without bridging and with expection to reach INR 2-3 at day 10 post stroke.  Cardiology on board  S/p MAZE procedure         Condition Points   C CHF (or Left ventricular systolic dysfunction) 1   H HTN: BP  consistently above 140/90 mmHg (or treated HTN on meds)  1   A2 Age ?75 years 2   D DM 1   S2 Prior Stroke or TIA or thromboembolism 2   V Vascular disease (e.g. PAD, MI, aortic plaque) 1   A Age 62-74 years 0   [place] male sex  0           Annual Stroke Risk CHA2DS2-VASc Score Stroke Risk % 95% CI  0 0  1 1.3  2 2.2  3 3.2  4 4.0  5 6.7  6 9.8  7 9.6  8 12.5  9 15.2      CAD s/p CABG and AVR  10/16/14 done  Due to AVR, pt likely to have coumadin for anticoagulation  CTS on board  Hypertension  Home meds: Atenolol, hydrochlorothiazide, and lisinopril  stable on Dopamine  Avoid hypotension to decrease cerebral hypoperfusion  Hyperlipidemia  Home meds: No lipid lowering medications prior to admission.  LDL 96, goal < 70  Currently AST 136 and ALT 23  May consider statin once AST normalizes  Diabetes  HgbA1c 8.9, goal < 7.0  Uncontrolled  On levemir  SSI  Management as per primary team  Other Stroke Risk Factors  Advanced age  Cigarette smoker, quit smoking in 1992   ETOH use  Obesity, Body mass index is 38.65 kg/(m^2).   Other Active Problems  Hypokalemia  Postop Anemia  Leukocytosis  Renal insufficiency  Elevated INR  Other Pertinent History    Hospital day # 3  This patient is critically ill due to large MCA infarct, coagulopathy, s/p intubation, s/p AVR + CABG + MAZE procedure, afib not on AC and at significant risk of neurological worsening, death form hemorrhagic transformation, recurrent stroke, heart failure, kidney failure, seizure, and respiratory failure. This patient's care requires constant monitoring of vital signs, hemodynamics, respiratory and cardiac monitoring, review of multiple databases, neurological assessment, discussion with family, other specialists and medical decision making of high complexity. I spent 45 minutes of neurocritical care time in the care of this patient.  Marvel Plan, MD PhD Stroke  Neurology 10/19/2014  11:02 AM   To contact Stroke Continuity provider, please refer to [email or web address]. After hours, contact General Neurology

## 2014-10-19 NOTE — Progress Notes (Signed)
CT surgery p.m. Rounds  Sinus rhythm stable blood pressure Patient slightly more alert and moving left side Attempt at ventilator wean completed but patient not ready today We'll continue with diuresis and attempt ventilator wean tomorrow

## 2014-10-19 NOTE — Progress Notes (Signed)
PULMONARY / CRITICAL CARE MEDICINE   Name: Charles [name] MRN: [id number] DOB: February 24, 1941    ADMISSION DATE:  10/16/2014 CONSULTATION DATE:  10/16/14   REFERRING MD :  Dr Cornelius Moras  REASON FOR CONSULTATION: Acute Respiratory Failure  INITIAL PRESENTATION: 74 yo obese man with hx COPD, restrictive lung disease, OSA/ OHS, chronic A fib, CAD with stable angina, Aortic Stenosis. He was admitted for elective CABG + AVR + Maze, performed on 10/16/14. He returned to the ICU intubated and sedated. Course c/b L MCA CVA  STUDIES:  L heart cath 07/18/14 >> severe three-vessel coronary artery disease with moderate aortic stenosis and elevated left ventricular filling pressures. Mean transvalvular gradient was reported 23.4 mmHg. Ejection fraction was estimated 45-50% PFT 08/12/14 >> combined restriction and obstruction resulting in severely decreased FEV1 1.72L (50% pred), positive BD response, restriction on lung volumes, decreased DLCO that almost fully corrects when adjusted for Va Head CT 8/19 >> Large L MCA distribution CVA, no associated shift or bleed  SIGNIFICANT EVENTS: 8/17 > CABG + AVR + Maze  SUBJECTIVE:  Has tolerated PSV Head Ct unfortunately confirms MCA distribution CVA Note code status change to DNR  VITAL SIGNS: Temp:  [98.2 F (36.8 C)-100.4 F (38 C)] 98.7 F (37.1 C) (08/20 0803) Pulse Rate:  [81-92] 86 (08/20 0803) Resp:  [18-27] 18 (08/20 0803) BP: (102-164)/(45-74) 150/56 mmHg (08/20 0803) SpO2:  [90 %-100 %] 100 % (08/20 0803) Arterial Line BP: (83-161)/(35-57) 140/48 mmHg (08/20 0700) FiO2 (%):  [40 %] 40 % (08/20 0803) Weight:  [136.6 kg (301 lb 2.4 oz)] 136.6 kg (301 lb 2.4 oz) (08/20 0400) HEMODYNAMICS:   VENTILATOR SETTINGS: Vent Mode:  [-] PSV;CPAP FiO2 (%):  [40 %] 40 % Set Rate:  [20 bmp] 20 bmp Vt Set:  [409 mL] 660 mL PEEP:  [5 cmH20] 5 cmH20 Pressure Support:  [5 cmH20] 5 cmH20 Plateau Pressure:  [19 cmH20-23 cmH20] 22 cmH20 INTAKE /  OUTPUT:  Intake/Output Summary (Last 24 hours) at 10/19/14 8119 Last data filed at 10/19/14 0800  Gross per 24 hour  Intake 1730.3 ml  Output   5795 ml  Net -4064.7 ml    PHYSICAL EXAMINATION: General:  Obese man, awake, intubated Neuro:  Opens eyes, does not follow commands, may be tracking on L side but neglecting the R, moved his L UE  but not R.  HEENT:  ETT, OGT, Pupils 4mm  Cardiovascular:  istant but regular Lungs:  Distant, decreased at bases, B basilar insp crackles Abdomen:  Obese, non-tender, no BS heard Musculoskeletal:  L LE wrapped, no deformities, no significant edema Skin:  No rash  LABS:  CBC  Recent Labs Lab 10/17/14 1711 10/18/14 0500 10/19/14 0518  WBC 11.9* 15.6* 13.8*  HGB 8.2* 9.0* 9.0*  HCT 23.6* 25.7* 26.6*  PLT 90* 101* 93*   Coag's  Recent Labs Lab 10/14/14 1335 10/16/14 1815  APTT 32 43*  INR 1.13 1.89*   BMET  Recent Labs Lab 10/17/14 1200 10/17/14 1708 10/17/14 1711 10/18/14 0500 10/19/14 0518  NA 141 141  --  140 139  K 3.3* 3.7  --  3.9 3.1*  CL 107 104  --  106 101  CO2 25  --   --  26 26  BUN 21* 22*  --  21* 24*  CREATININE 1.53* 1.40* 1.63* 1.50* 1.51*  GLUCOSE 114* 109*  --  120* 105*   Electrolytes  Recent Labs Lab 10/17/14 0317 10/17/14 1200 10/17/14 1711 10/18/14 0500 10/19/14 0518  CALCIUM 7.8* 8.2*  --  8.0* 7.7*  MG 2.8* 2.6* 2.5*  --   --    Sepsis Markers No results for input(s): LATICACIDVEN, PROCALCITON, O2SATVEN in the last 168 hours. ABG  Recent Labs Lab 10/17/14 0313 10/17/14 1213 10/18/14 0520  PHART 7.276* 7.499* 7.489*  PCO2ART 41.0 35.0 32.0*  PO2ART 71.0* 85.0 75.0*   Liver Enzymes  Recent Labs Lab 10/14/14 1335 10/18/14 0500  AST 36 136*  ALT 16* 23  ALKPHOS 69 39  BILITOT 0.8 1.6*  ALBUMIN 3.7 2.9*   Cardiac Enzymes No results for input(s): TROPONINI, PROBNP in the last 168 hours. Glucose  Recent Labs Lab 10/18/14 1947 10/18/14 2054 10/18/14 2201  10/18/14 2305 10/19/14 0013 10/19/14 0428  GLUCAP 111* 105* 100* 94 81 103*    Imaging Ct Head Wo Contrast  10/18/2014   CLINICAL DATA:  Right side flaccid; altered mental status  EXAM: CT HEAD WITHOUT CONTRAST  TECHNIQUE: Contiguous axial images were obtained from the base of the skull through the vertex without intravenous contrast.  COMPARISON:  None.  FINDINGS: There is mild underlying atrophy. There is cytotoxic edema throughout essentially the entire left middle cerebral artery distribution with impression on the left lateral ventricle. There is no midline shift. There is no hemorrhage or well-defined mass. There is no subdural or epidural fluid. Elsewhere there is mild small vessel disease in the centra semiovale bilaterally. No other acute infarct evident. There is increased attenuation in the left middle cerebral artery consistent with thrombosis of this vessel.  The bony calvarium appears intact. The mastoid air cells are clear. There is bilateral ethmoid air cell disease. There is a small retention cyst in the posterior right maxillary antrum  IMPRESSION: Acute infarct involving essentially the entire left middle cerebral artery distribution with partial effacement of the left lateral ventricle. There is increased attenuation in the left middle cerebral artery consistent with thrombosis of this vessel. No midline shift. No hemorrhage. There is mild underlying atrophy with mild periventricular small vessel disease.  Critical Value/emergent results were called by telephone at the time of interpretation on 10/18/2014 at 12:57 pm to Dr. Tressie Stalker , who verbally acknowledged these results.   Electronically Signed   By: Bretta Bang III M.D.   On: 10/18/2014 12:57   CXR 8/20 reviewed by me > shows L atx, no real change from 8/19  ASSESSMENT / PLAN:  PULMONARY OETT 8/17 >>  R chest tube 8/17 >>  L chest tubes 8/17 >>  A: Acute respiratory failure COPD OSA / Obesity hypoventilation  syndrome Stable calcified RUL nodule 2 smaller LUL nodules on CT 07/2014, unclear how long they have been present P:   PEEP to 5, tolerating PSV No plan to extubate given current neuro status. Will need to consider extubation to fully assess airway protection. Dr Cornelius Moras has discussed code status with his nephew. Does not sound like Mr Weimer would want prolonged invasive care, trach, etc.  Wean O2 as able Scheduled BD's; suspect he needs daily BD as an outpt as well If stabizes, Will try to obtain his old CT chest from [place] 2010, determine if the LUL nodules are new as of our 2016 CT. If so then I will follow with serial films.   CARDIOVASCULAR R IJ CVC + PA-c 8/17 >> 8/19 L radial art line 8/17 >>  A: s/p CABG, AVR, Maze Cardiogenic Shock post-op Chronic Systolic and Diastolic CHF Atrial fib Hx HTN P:  Weaning cardiac drips Lasix gtt  started 8/19 PA-c removed 8/19  RENAL A:  Acute renal insufficiency, stable w active diuresis Hypokalemia  P:   Follow BMP on lasix gtt  GASTROINTESTINAL A:  SUP P:   NPO PPI  HEMATOLOGIC A:  Thrombocytopenia Anemia, acute blood loss, improving P:  Follow CBC   INFECTIOUS A:  No evidence active infection P:   Cefuroxime and vanco surgical prophylaxis 8/17 >> 8/18  ENDOCRINE A:  DM   P:   Insulin gtt levemir   NEUROLOGIC A:  Sedation while ventilated Large L MCA CVA P:   Neurology following. No role for anticoag until stable post-op RASS goal: 0 Precedex, morphine and versed prn   FAMILY  - Updates: no family present 8/20  - Inter-disciplinary family meet or Palliative Care meeting due by:  10/23/14    TODAY'S SUMMARY:  Post-op CABG, AVR, Maze procedure.  Complicated by CVA. Tolerating PSV but no plan to extubate at this time given other active issues.     Independent CC time 32 minutes.    Levy Pupa, MD, PhD 10/19/2014, 9:23 AM [facility] [phone] or if no answer [phone]

## 2014-10-20 ENCOUNTER — Inpatient Hospital Stay (HOSPITAL_COMMUNITY): Payer: Medicare Other

## 2014-10-20 DIAGNOSIS — I10 Essential (primary) hypertension: Secondary | ICD-10-CM

## 2014-10-20 LAB — CBC
HCT: 25 % — ABNORMAL LOW (ref 39.0–52.0)
Hemoglobin: 8.4 g/dL — ABNORMAL LOW (ref 13.0–17.0)
MCH: 29.9 pg (ref 26.0–34.0)
MCHC: 33.6 g/dL (ref 30.0–36.0)
MCV: 89 fL (ref 78.0–100.0)
PLATELETS: 116 10*3/uL — AB (ref 150–400)
RBC: 2.81 MIL/uL — ABNORMAL LOW (ref 4.22–5.81)
RDW: 15.9 % — AB (ref 11.5–15.5)
WBC: 11.8 10*3/uL — AB (ref 4.0–10.5)

## 2014-10-20 LAB — GLUCOSE, CAPILLARY
GLUCOSE-CAPILLARY: 110 mg/dL — AB (ref 65–99)
GLUCOSE-CAPILLARY: 77 mg/dL (ref 65–99)
GLUCOSE-CAPILLARY: 90 mg/dL (ref 65–99)
Glucose-Capillary: 78 mg/dL (ref 65–99)
Glucose-Capillary: 58 mg/dL — ABNORMAL LOW (ref 65–99)
Glucose-Capillary: 78 mg/dL (ref 65–99)
Glucose-Capillary: 69 mg/dL (ref 65–99)
Glucose-Capillary: 84 mg/dL (ref 65–99)
Glucose-Capillary: 76 mg/dL (ref 65–99)

## 2014-10-20 LAB — CARBOXYHEMOGLOBIN
CARBOXYHEMOGLOBIN: 1.5 % (ref 0.5–1.5)
Methemoglobin: 1.3 % (ref 0.0–1.5)
O2 SAT: 72.8 %
TOTAL HEMOGLOBIN: 8.7 g/dL — AB (ref 13.5–18.0)

## 2014-10-20 LAB — POCT I-STAT, CHEM 8
BUN: 27 mg/dL — ABNORMAL HIGH (ref 6–20)
Calcium, Ion: 0.92 mmol/L — ABNORMAL LOW (ref 1.13–1.30)
Chloride: 100 mmol/L — ABNORMAL LOW (ref 101–111)
Creatinine, Ser: 1.5 mg/dL — ABNORMAL HIGH (ref 0.61–1.24)
Glucose, Bld: 90 mg/dL (ref 65–99)
HCT: 28 % — ABNORMAL LOW (ref 39.0–52.0)
Hemoglobin: 9.5 g/dL — ABNORMAL LOW (ref 13.0–17.0)
Potassium: 3.1 mmol/L — ABNORMAL LOW (ref 3.5–5.1)
Sodium: 138 mmol/L (ref 135–145)
TCO2: 28 mmol/L (ref 0–100)

## 2014-10-20 LAB — BASIC METABOLIC PANEL
Anion gap: 11 (ref 5–15)
BUN: 27 mg/dL — AB (ref 6–20)
CALCIUM: 7.5 mg/dL — AB (ref 8.9–10.3)
CO2: 28 mmol/L (ref 22–32)
Chloride: 101 mmol/L (ref 101–111)
Creatinine, Ser: 1.45 mg/dL — ABNORMAL HIGH (ref 0.61–1.24)
GFR calc Af Amer: 53 mL/min — ABNORMAL LOW (ref >60)
GFR, EST NON AFRICAN AMERICAN: 46 mL/min — AB (ref >60)
GLUCOSE: 74 mg/dL (ref 65–99)
Potassium: 2.7 mmol/L — CL (ref 3.5–5.1)
Sodium: 140 mmol/L (ref 135–145)

## 2014-10-20 LAB — PROTIME-INR
INR: 1.18 (ref 0.00–1.49)
Prothrombin Time: 15.2 seconds (ref 11.6–15.2)

## 2014-10-20 LAB — POCT I-STAT 3, ART BLOOD GAS (G3+)
Acid-Base Excess: 13 mmol/L — ABNORMAL HIGH (ref 0.0–2.0)
Acid-Base Excess: 7 mmol/L — ABNORMAL HIGH (ref 0.0–2.0)
Acid-Base Excess: 9 mmol/L — ABNORMAL HIGH (ref 0.0–2.0)
BICARBONATE: 35.9 meq/L — AB (ref 20.0–24.0)
BICARBONATE: 33.1 meq/L — AB (ref 20.0–24.0)
Bicarbonate: 30.7 mEq/L — ABNORMAL HIGH (ref 20.0–24.0)
O2 SAT: 95 %
O2 Saturation: 98 %
O2 Saturation: 98 %
PH ART: 7.552 — AB (ref 7.350–7.450)
PH ART: 7.519 — AB (ref 7.350–7.450)
PO2 ART: 104 mmHg — AB (ref 80.0–100.0)
Patient temperature: 100.5
TCO2: 37 mmol/L (ref 0–100)
TCO2: 32 mmol/L (ref 0–100)
TCO2: 34 mmol/L (ref 0–100)
pCO2 arterial: 41.1 mmHg (ref 35.0–45.0)
pCO2 arterial: 40.1 mmHg (ref 35.0–45.0)
pCO2 arterial: 40.7 mmHg (ref 35.0–45.0)
pH, Arterial: 7.492 — ABNORMAL HIGH (ref 7.350–7.450)
pO2, Arterial: 69 mmHg — ABNORMAL LOW (ref 80.0–100.0)
pO2, Arterial: 92 mmHg (ref 80.0–100.0)

## 2014-10-20 MED ORDER — DEXTROSE 50 % IV SOLN
25.0000 mL | Freq: Once | INTRAVENOUS | Status: AC
  Administered 2014-10-20: 25 mL via INTRAVENOUS

## 2014-10-20 MED ORDER — CHLORHEXIDINE GLUCONATE 0.12 % MT SOLN
15.0000 mL | Freq: Two times a day (BID) | OROMUCOSAL | Status: DC
  Administered 2014-10-20 – 2014-10-24 (×8): 15 mL via OROMUCOSAL
  Filled 2014-10-20 (×3): qty 15

## 2014-10-20 MED ORDER — VITAL 1.5 CAL PO LIQD
1000.0000 mL | ORAL | Status: DC
  Administered 2014-10-21: 1000 mL
  Filled 2014-10-20 (×2): qty 1000

## 2014-10-20 MED ORDER — SODIUM CHLORIDE 0.9 % IV SOLN: 250.0000 mL | INTRAVENOUS | Status: DC

## 2014-10-20 MED ORDER — POTASSIUM CHLORIDE 10 MEQ/50ML IV SOLN
10.0000 meq | INTRAVENOUS | Status: AC
  Administered 2014-10-20 (×2): 10 meq via INTRAVENOUS
  Filled 2014-10-20 (×2): qty 50

## 2014-10-20 MED ORDER — POTASSIUM CHLORIDE 10 MEQ/50ML IV SOLN
10.0000 meq | INTRAVENOUS | Status: AC
  Administered 2014-10-20 (×5): 10 meq via INTRAVENOUS
  Filled 2014-10-20 (×2): qty 50

## 2014-10-20 MED ORDER — POTASSIUM CHLORIDE 10 MEQ/50ML IV SOLN
10.0000 meq | INTRAVENOUS | Status: AC | PRN
  Administered 2014-10-20 (×3): 10 meq via INTRAVENOUS

## 2014-10-20 MED ORDER — INSULIN DETEMIR 100 UNIT/ML ~~LOC~~ SOLN
40.0000 [IU] | Freq: Two times a day (BID) | SUBCUTANEOUS | Status: DC
  Filled 2014-10-20 (×3): qty 0.4

## 2014-10-20 MED ORDER — CETYLPYRIDINIUM CHLORIDE 0.05 % MT LIQD
7.0000 mL | Freq: Two times a day (BID) | OROMUCOSAL | Status: DC
  Administered 2014-10-21 – 2014-10-24 (×6): 7 mL via OROMUCOSAL

## 2014-10-20 MED ORDER — DEXTROSE 50 % IV SOLN
INTRAVENOUS | Status: AC
  Filled 2014-10-20: qty 50

## 2014-10-20 NOTE — Progress Notes (Signed)
STROKE TEAM PROGRESS NOTE   HISTORY Charles [name] is a 74 y.o. male with hx COPD, restrictive lung disease, OSA/ OHS, chronic A fib, CAD with stable angina, Aortic Stenosis. He was admitted for elective CABG + AVR + Maze, performed on 10/16/14. He returned to the ICU intubated and sedated. Sedation discontinued approximately 24 hours ago and patient has not regained presurgical mental status. There has been some spontaneous movement on the left but no movement noted on the right. He is not following commands. Consult called for further recommendations.   Date last known well: Date: 10/16/2014 Time last known well: Time: 07:48 tPA Given: No: Outside time window, s/p major surgery   SUBJECTIVE (INTERVAL HISTORY) His RN is at the bedside. His eyes open, still has right neglect and hemiplegia. Not significant change from yesterday. Repeat CT head in am has not done yet.   OBJECTIVE Temp:  [97.5 F (36.4 C)-98.7 F (37.1 C)] 97.5 F (36.4 C) (08/21 0400) Pulse Rate:  [68-87] 71 (08/21 0900) Cardiac Rhythm:  [-] Normal sinus rhythm (08/21 0900) Resp:  [14-26] 19 (08/21 0900) BP: (106-151)/(38-79) 134/46 mmHg (08/21 0900) SpO2:  [96 %-100 %] 99 % (08/21 0900) Arterial Line BP: (67-159)/(37-111) 159/52 mmHg (08/21 0900) FiO2 (%):  [40 %] 40 % (08/21 0900) Weight:  [296 lb 4.8 oz (134.4 kg)] 296 lb 4.8 oz (134.4 kg) (08/21 0600)   Recent Labs Lab 10/19/14 1145 10/19/14 1606 10/19/14 1958 10/20/14 0013 10/20/14 0834  GLUCAP 117* 135* 134* 110* 58*    Recent Labs Lab 10/17/14 0317 10/17/14 1200 10/17/14 1708 10/17/14 1711 10/18/14 0500 10/19/14 0518 10/19/14 1807 10/20/14 0557  NA 141 141 141  --  140 139 139 140  K 2.7* 3.3* 3.7  --  3.9 3.1* 3.0* 2.7*  CL 108 107 104  --  106 101 98* 101  CO2 20* 25  --   --  26 26  --  28  GLUCOSE 253* 114* 109*  --  120* 105* 135* 74  BUN 22* 21* 22*  --  21* 24* 27* 27*  CREATININE 1.69* 1.53* 1.40* 1.63* 1.50* 1.51* 1.40*  1.45*  CALCIUM 7.8* 8.2*  --   --  8.0* 7.7*  --  7.5*  MG 2.8* 2.6*  --  2.5*  --   --   --   --     Recent Labs Lab 10/14/14 1335 10/18/14 0500  AST 36 136*  ALT 16* 23  ALKPHOS 69 39  BILITOT 0.8 1.6*  PROT 6.9 4.7*  ALBUMIN 3.7 2.9*    Recent Labs Lab 10/17/14 1200  10/17/14 1711 10/18/14 0500 10/19/14 0518 10/19/14 1807 10/20/14 0557  WBC 11.8*  --  11.9* 15.6* 13.8*  --  11.8*  HGB 8.7*  < > 8.2* 9.0* 9.0* 8.8* 8.4*  HCT 25.0*  < > 23.6* 25.7* 26.6* 26.0* 25.0*  MCV 86.5  --  85.8 85.7 88.4  --  89.0  PLT 92*  --  90* 101* 93*  --  116*  < > = values in this interval not displayed. No results for input(s): CKTOTAL, CKMB, CKMBINDEX, TROPONINI in the last 168 hours.  Recent Labs  10/20/14 0557  LABPROT 15.2  INR 1.18   No results for input(s): COLORURINE, LABSPEC, PHURINE, GLUCOSEU, HGBUR, BILIRUBINUR, KETONESUR, PROTEINUR, UROBILINOGEN, NITRITE, LEUKOCYTESUR in the last 72 hours.  Invalid input(s): APPERANCEUR     Component Value Date/Time   CHOL 136 06/05/2014 1211   TRIG 80.0 06/05/2014 1211  HDL 24.20* 06/05/2014 1211   CHOLHDL 6 06/05/2014 1211   VLDL 16.0 06/05/2014 1211   LDLCALC 96 06/05/2014 1211   Lab Results  Component Value Date   HGBA1C 8.9* 10/14/2014   No results found for: LABOPIA, COCAINSCRNUR, LABBENZ, AMPHETMU, THCU, LABBARB  No results for input(s): ETH in the last 168 hours.   IMAGING  Dg Chest 1 View 10/18/2014    1. Endotracheal tube has been advanced. Its tip is 2.3 cm above the carina. Remaining lines and tubes in stable position.  2. Prior CABG and aortic valve replacement. Persistent cardiomegaly. Increased interstitial markings suggesting mild congestive heart failure.  3. Low lung volumes with bibasilar atelectasis.     Ct Head Wo Contrast 10/18/2014    Acute infarct involving essentially the entire left middle cerebral artery distribution with partial effacement of the left lateral ventricle. There is increased  attenuation in the left middle cerebral artery consistent with thrombosis of this vessel. No midline shift. No hemorrhage. There is mild underlying atrophy with mild periventricular small vessel disease.    TEE Intraoperative   10/16/2014 Impressions: - LV systolic function baseline to mildly diminished, Right heart with moderate dysfunction, mild MR at baseline, no AI with well seated and function AV, no PFO or new ASD, no signs of dissection, TV with no TR, PAC in main PA, no signs of significant pleural effusion.  CT repeat pending  CUS - 1. Right: 40-59% ICA stensosis. Left: 60-79% ICA stenosis. 2. Right vertebral antegrade. Left vertebral flow not found.  PHYSICAL EXAM  Temp:  [97.5 F (36.4 C)-98.7 F (37.1 C)] 97.5 F (36.4 C) (08/21 0400) Pulse Rate:  [68-87] 71 (08/21 0900) Resp:  [14-26] 19 (08/21 0900) BP: (106-151)/(38-79) 134/46 mmHg (08/21 0900) SpO2:  [96 %-100 %] 99 % (08/21 0900) Arterial Line BP: (67-159)/(37-111) 159/52 mmHg (08/21 0900) FiO2 (%):  [40 %] 40 % (08/21 0900) Weight:  [296 lb 4.8 oz (134.4 kg)] 296 lb 4.8 oz (134.4 kg) (08/21 0600)  General - Well nourished, well developed, intubated not following commands.  Ophthalmologic - fundi not visualized due to noncooperation.  Cardiovascular - Regular rate and rhythm, not in afib.  Neuro - awake, eyes open, still intubated. Seems followed commands on eye closure but not other simple commands. Right visual neglect, blinking to visual threat on the left but not on the right, PERRL, eyes left gaze preference and barely cross to midline. Right nasolabial fold flattening. RUE flaccid, 0/5 on pain stimulatin, RLE flaccid but increased muscle tone, trace withdraw on pain stimulatin. LUE and LLE spontaneous movement. Right babinski positive.  ASSESSMENT/PLAN [name] is a 74 y.o. male with history of diabetes mellitus, hypertension, copd, aortic stenosis, atrial fibrillation (Xarelto in the  past DC'd secondary to hematuria), visual impairment, pulmonary nodule, previous stroke in 1997, asthma, and elective CABG + AVR + Maze, performed on 10/16/14.  presenting with altered mental status and right hemiplegia. He did not receive IV t-PA due to recent surgery.  Stroke:  Left MCA large infarct - embolic from known Afib not on [facility] or cardiac procedure related.  Resultant  Aphasia, right neglect and right hemiplegia.  MRI  not needed as it will not change management  MRA  not needed as it will not change management  CUS - 40-59% RICA stensosis. 60-79% LICA stenosis. Right vertebral antegrade. Left vertebral flow not found.  Recommend CTA neck once Cre normalizes  Repeat CT in am to evaluate brain edema - pending  TEE intraoperative EF 40 -45%. No PFO or cardiac clot.  LDL 96  HgbA1c 8.9  SCDs for VTE prophylaxis Diet NPO time specified  aspirin 81 mg orally every day prior to admission, now on aspirin 325 mg orally every day. Due to afib, pt should be on [facility] for stroke prevention. Currently pt has large MCA infarct, to avoid hemorrhagic transformation, will recommend to start [facility] at 10 days post stroke if not contraindicated. Due to valvular afib, coumadin may be the choice for anticoagulation. Recommend start coumadin at day 7, no need bridge with expection to reach INR 2-3 at day 10 post stroke.   Ongoing aggressive stroke risk factor management  Therapy recommendations:  Pending  Disposition:  Pending  Cerebral edema  Mass effect on initial CT with ventrical compression  No mid line shift on last CT  Repeat CT in am to evaluate cerebral edema - pending  Has central line placed  May consider 3% saline if needed  Chronic afib  Not on [facility] but on ASA at home  Has home amiodarone  Due to high CHA2DS2-VASc = 8, recommend anticoagulation at 7-10 days after stroke to avoid hemorrhagic transformation as the left MCA stroke is large. Recommend coumadin at day 7 without  bridging and with expection to reach INR 2-3 at day 10 post stroke.  Cardiology on board  S/p MAZE procedure         Condition Points   C CHF (or Left ventricular systolic dysfunction) 1   H HTN: BP consistently above 140/90 mmHg (or treated HTN on meds)  1   A2 Age ?75 years 2   D DM 1   S2 Prior Stroke or TIA or thromboembolism 2   V Vascular disease (e.g. PAD, MI, aortic plaque) 1   A Age 17-74 years 0   Terry male sex  0           Annual Stroke Risk CHA2DS2-VASc Score Stroke Risk % 95% CI  0 0  1 1.3  2 2.2  3 3.2  4 4.0  5 6.7  6 9.8  7 9.6  8 12.5  9 15.2      CAD s/p CABG and AVR  10/16/14 done  Due to AVR, pt likely to have coumadin for anticoagulation  CTS on board  Hypertension  Home meds: Atenolol, hydrochlorothiazide, and lisinopril  stable on Dopamine  Avoid hypotension to decrease cerebral hypoperfusion  Hyperlipidemia  Home meds: No lipid lowering medications prior to admission.  LDL 96, goal < 70  Currently AST 136 and ALT 23  May consider statin once AST normalizes  Diabetes  HgbA1c 8.9, goal < 7.0  Uncontrolled  On levemir  SSI  Management as per primary team  Other Stroke Risk Factors  Advanced age  Cigarette smoker, quit smoking in 1992   ETOH use  Obesity, Body mass index is 38.03 kg/(m^2).   Other Active Problems  Hypokalemia  Postop Anemia  Leukocytosis  Renal insufficiency  Elevated INR  Other Pertinent History    Hospital day # 4  This patient is critically ill due to large MCA infarct, coagulopathy, s/p intubation, s/p AVR + CABG + MAZE procedure, afib not on AC and at significant risk of neurological worsening, death form hemorrhagic transformation, recurrent stroke, heart failure, kidney failure, seizure, and respiratory failure. This patient's care requires constant monitoring of vital signs, hemodynamics, respiratory and cardiac monitoring, review of multiple databases, neurological assessment,  discussion with family, other specialists and  medical decision making of high complexity. I spent 35 minutes of neurocritical care time in the care of this patient.  Marvel Plan, MD PhD Stroke Neurology 10/20/2014 9:13 AM   To contact Stroke Continuity provider, please refer to [email or web address]. After hours, contact General Neurology

## 2014-10-20 NOTE — Progress Notes (Signed)
CT surgery p.m. Rounds  Patient extubated today with adequate ABGs Patient unable to speak but does follow some commands Attempt at [facility] placement was unsuccessful at bedside-will need fluoroscopic guidance Hemodynamic stable, sinus rhythm Dopamine weaned off

## 2014-10-20 NOTE — Procedures (Signed)
Extubation Procedure Note  Patient Details:   Name: Johann Santone DOB: [birth year] MRN: [id number]   Airway Documentation:  Airway 8 mm (Active)  Secured at (cm) 28 cm 10/20/2014 11:00 AM  Measured From Lips 10/20/2014 11:00 AM  Secured Location Right 10/20/2014 11:00 AM  Secured By [employer] 10/20/2014 11:00 AM  Tube Holder Repositioned Yes 10/20/2014 11:00 AM  Cuff Pressure (cm H2O) 26 cm H2O 10/20/2014  4:09 AM  Site Condition Dry 10/20/2014 11:00 AM    Evaluation  O2 sats: stable throughout Complications: No apparent complications Patient did tolerate procedure well. Bilateral Breath Sounds: Clear Suctioning: Airway Yes   Pt. Was extubated to a 4L [place] without any complications, dyspnea or stridor noted. Pt. Achieved a goal of .9L on VC & -26 on NIF. Pt. Is confused at this time (likely due to CVA) & is unable to do IS.   Sohaib Vereen, Margaretmary Dys 10/20/2014, 1:18 PM

## 2014-10-20 NOTE — Progress Notes (Signed)
4 Days Post-Op Procedure(s) (LRB): AORTIC VALVE REPLACEMENT (AVR)/CORONARY ARTERY BYPASS GRAFTING (CABG) x4 using left internal mammary artery and bilateral greater saphenous veins harvested laparoscopically (N/A) MAZE (N/A) TRANSESOPHAGEAL ECHOCARDIOGRAM (TEE) (N/A) CLIPPING OF ATRIAL APPENDAGE (N/A) Subjective: Back in nsr with iv amio Moving L side but not following command- repeat head CT ordered by neuro Avoid anticoagulation for 7-10 days after CVA to prevent hemorrhagic transformation Placing panda for TF Will attempt vent wean again today  Objective: Vital signs in last 24 hours: Temp:  [97.5 F (36.4 C)-98.7 F (37.1 C)] 97.5 F (36.4 C) (08/21 0400) Pulse Rate:  [68-87] 71 (08/21 0900) Cardiac Rhythm:  [-] Normal sinus rhythm (08/21 0900) Resp:  [14-26] 19 (08/21 0900) BP: (106-151)/(38-79) 134/46 mmHg (08/21 0900) SpO2:  [96 %-100 %] 99 % (08/21 0900) Arterial Line BP: (67-159)/(37-111) 159/52 mmHg (08/21 0900) FiO2 (%):  [40 %] 40 % (08/21 0900) Weight:  [296 lb 4.8 oz (134.4 kg)] 296 lb 4.8 oz (134.4 kg) (08/21 0600)  Hemodynamic parameters for last 24 hours:  stable  Intake/Output from previous day: 08/20 0701 - 08/21 0700 In: 1416.4 [I.V.:1006.4; NG/GT:310; IV Piggyback:100] Out: 4275 [Urine:4075; Chest Tube:200] Intake/Output this shift:    R side flaccid Edema better with lasix drip Min chest tub drainage Lab Results:  Recent Labs  10/19/14 0518 10/19/14 1807 10/20/14 0557  WBC 13.8*  --  11.8*  HGB 9.0* 8.8* 8.4*  HCT 26.6* 26.0* 25.0*  PLT 93*  --  116*   BMET:  Recent Labs  10/19/14 0518 10/19/14 1807 10/20/14 0557  NA 139 139 140  K 3.1* 3.0* 2.7*  CL 101 98* 101  CO2 26  --  28  GLUCOSE 105* 135* 74  BUN 24* 27* 27*  CREATININE 1.51* 1.40* 1.45*  CALCIUM 7.7*  --  7.5*    PT/INR:  Recent Labs  10/20/14 0557  LABPROT 15.2  INR 1.18   ABG    Component Value Date/Time   PHART 7.489* 10/19/2014 1142   HCO3 29.7*  10/19/2014 1142   TCO2 26 10/19/2014 1807   ACIDBASEDEF 7.0* 10/17/2014 0313   O2SAT 72.8 10/20/2014 0601   CBG (last 3)   Recent Labs  10/20/14 0013 10/20/14 0834 10/20/14 0945  GLUCAP 110* 58* 78    Assessment/Plan: S/P Procedure(s) (LRB): AORTIC VALVE REPLACEMENT (AVR)/CORONARY ARTERY BYPASS GRAFTING (CABG) x4 using left internal mammary artery and bilateral greater saphenous veins harvested laparoscopically (N/A) MAZE (N/A) TRANSESOPHAGEAL ECHOCARDIOGRAM (TEE) (N/A) CLIPPING OF ATRIAL APPENDAGE (N/A) Diuresis Diabetes control d/c tubes/lines supp K Attempt vent wean  LOS: 4 days    Kathlee Nations Trigt III 10/20/2014

## 2014-10-20 NOTE — Progress Notes (Signed)
eLink Physician-Brief Progress Note Patient Name: Charles [name] DOB: Aug 14, 1940 MRN: [id number]   Date of Service  10/20/2014  HPI/Events of Note  Hypoglycemia throughout the day today.  eICU Interventions  Nurse to hold levemir     Intervention Category Intermediate Interventions: Other:  Henry Russel, Demetrius Charity 10/20/2014, 10:53 PM

## 2014-10-20 NOTE — Progress Notes (Signed)
Pt. Was transported to CT & back to 2S06 without any complications.

## 2014-10-21 ENCOUNTER — Inpatient Hospital Stay (HOSPITAL_COMMUNITY): Payer: Medicare Other

## 2014-10-21 DIAGNOSIS — L8990 Pressure ulcer of unspecified site, unspecified stage: Secondary | ICD-10-CM | POA: Diagnosis not present

## 2014-10-21 DIAGNOSIS — I6340 Cerebral infarction due to embolism of unspecified cerebral artery: Secondary | ICD-10-CM

## 2014-10-21 DIAGNOSIS — I482 Chronic atrial fibrillation, unspecified: Secondary | ICD-10-CM | POA: Diagnosis present

## 2014-10-21 LAB — BASIC METABOLIC PANEL
ANION GAP: 12 (ref 5–15)
ANION GAP: 11 (ref 5–15)
BUN: 27 mg/dL — ABNORMAL HIGH (ref 6–20)
BUN: 32 mg/dL — ABNORMAL HIGH (ref 6–20)
CO2: 31 mmol/L (ref 22–32)
CO2: 33 mmol/L — ABNORMAL HIGH (ref 22–32)
Calcium: 7.4 mg/dL — ABNORMAL LOW (ref 8.9–10.3)
Calcium: 7.4 mg/dL — ABNORMAL LOW (ref 8.9–10.3)
Chloride: 97 mmol/L — ABNORMAL LOW (ref 101–111)
Chloride: 97 mmol/L — ABNORMAL LOW (ref 101–111)
Creatinine, Ser: 1.37 mg/dL — ABNORMAL HIGH (ref 0.61–1.24)
Creatinine, Ser: 1.46 mg/dL — ABNORMAL HIGH (ref 0.61–1.24)
GFR calc Af Amer: 52 mL/min — ABNORMAL LOW (ref >60)
GFR, EST AFRICAN AMERICAN: 57 mL/min — AB (ref >60)
GFR, EST NON AFRICAN AMERICAN: 49 mL/min — AB (ref >60)
GFR, EST NON AFRICAN AMERICAN: 45 mL/min — AB (ref >60)
GLUCOSE: 93 mg/dL (ref 65–99)
GLUCOSE: 181 mg/dL — AB (ref 65–99)
POTASSIUM: 3.2 mmol/L — AB (ref 3.5–5.1)
POTASSIUM: 3.7 mmol/L (ref 3.5–5.1)
SODIUM: 140 mmol/L (ref 135–145)
SODIUM: 141 mmol/L (ref 135–145)

## 2014-10-21 LAB — POCT I-STAT 3, ART BLOOD GAS (G3+)
ACID-BASE EXCESS: 8 mmol/L — AB (ref 0.0–2.0)
Acid-Base Excess: 6 mmol/L — ABNORMAL HIGH (ref 0.0–2.0)
Acid-Base Excess: 10 mmol/L — ABNORMAL HIGH (ref 0.0–2.0)
BICARBONATE: 28.3 meq/L — AB (ref 20.0–24.0)
Bicarbonate: 32.2 mEq/L — ABNORMAL HIGH (ref 20.0–24.0)
Bicarbonate: 33.3 mEq/L — ABNORMAL HIGH (ref 20.0–24.0)
O2 SAT: 96 %
O2 Saturation: 95 %
O2 Saturation: 95 %
PCO2 ART: 32.9 mmHg — AB (ref 35.0–45.0)
PH ART: 7.49 — AB (ref 7.350–7.450)
PH ART: 7.515 — AB (ref 7.350–7.450)
PO2 ART: 65 mmHg — AB (ref 80.0–100.0)
Patient temperature: 98.5
TCO2: 33 mmol/L (ref 0–100)
TCO2: 29 mmol/L (ref 0–100)
TCO2: 35 mmol/L (ref 0–100)
pCO2 arterial: 42.3 mmHg (ref 35.0–45.0)
pCO2 arterial: 41.3 mmHg (ref 35.0–45.0)
pH, Arterial: 7.543 — ABNORMAL HIGH (ref 7.350–7.450)
pO2, Arterial: 80 mmHg (ref 80.0–100.0)
pO2, Arterial: 68 mmHg — ABNORMAL LOW (ref 80.0–100.0)

## 2014-10-21 LAB — CBC
HCT: 29.3 % — ABNORMAL LOW (ref 39.0–52.0)
Hemoglobin: 9.5 g/dL — ABNORMAL LOW (ref 13.0–17.0)
MCH: 29.6 pg (ref 26.0–34.0)
MCHC: 32.4 g/dL (ref 30.0–36.0)
MCV: 91.3 fL (ref 78.0–100.0)
PLATELETS: 152 10*3/uL (ref 150–400)
RBC: 3.21 MIL/uL — AB (ref 4.22–5.81)
RDW: 15.8 % — ABNORMAL HIGH (ref 11.5–15.5)
WBC: 13.2 10*3/uL — AB (ref 4.0–10.5)

## 2014-10-21 LAB — GLUCOSE, CAPILLARY
GLUCOSE-CAPILLARY: 174 mg/dL — AB (ref 65–99)
Glucose-Capillary: 140 mg/dL — ABNORMAL HIGH (ref 65–99)
Glucose-Capillary: 86 mg/dL (ref 65–99)
Glucose-Capillary: 90 mg/dL (ref 65–99)
Glucose-Capillary: 98 mg/dL (ref 65–99)

## 2014-10-21 LAB — CARBOXYHEMOGLOBIN
Carboxyhemoglobin: 1.5 % (ref 0.5–1.5)
Methemoglobin: 1.2 % (ref 0.0–1.5)
O2 SAT: 70.4 %
Total hemoglobin: 10.5 g/dL — ABNORMAL LOW (ref 13.5–18.0)

## 2014-10-21 MED ORDER — MORPHINE SULFATE (PF) 2 MG/ML IV SOLN
2.0000 mg | INTRAVENOUS | Status: DC | PRN
  Administered 2014-10-23: 2 mg via INTRAVENOUS
  Filled 2014-10-21: qty 1

## 2014-10-21 MED ORDER — IOHEXOL 300 MG/ML  SOLN
50.0000 mL | Freq: Once | INTRAMUSCULAR | Status: DC | PRN
  Administered 2014-10-21: 40 mL via INTRAVENOUS
  Filled 2014-10-21: qty 50

## 2014-10-21 MED ORDER — POTASSIUM CHLORIDE 10 MEQ/50ML IV SOLN
10.0000 meq | INTRAVENOUS | Status: AC
  Administered 2014-10-21 (×3): 10 meq via INTRAVENOUS
  Filled 2014-10-21: qty 50

## 2014-10-21 MED ORDER — SODIUM CHLORIDE 0.9 % IJ SOLN
10.0000 mL | Freq: Two times a day (BID) | INTRAMUSCULAR | Status: DC
  Administered 2014-10-22 – 2014-10-24 (×4): 10 mL

## 2014-10-21 MED ORDER — SODIUM CHLORIDE 0.9 % IJ SOLN: 10.0000 mL | INTRAMUSCULAR | Status: DC | PRN

## 2014-10-21 MED ORDER — LIDOCAINE VISCOUS 2 % MT SOLN
OROMUCOSAL | Status: AC
  Filled 2014-10-21: qty 15

## 2014-10-21 MED ORDER — POTASSIUM CHLORIDE 10 MEQ/50ML IV SOLN
10.0000 meq | INTRAVENOUS | Status: AC
  Administered 2014-10-21 (×3): 10 meq via INTRAVENOUS
  Filled 2014-10-21 (×3): qty 50

## 2014-10-21 NOTE — Progress Notes (Signed)
      [address]       [place] [address]             [phone]        CARDIOTHORACIC SURGERY PROGRESS NOTE   R5 Days Post-Op Procedure(s) (LRB): AORTIC VALVE REPLACEMENT (AVR)/CORONARY ARTERY BYPASS GRAFTING (CABG) x4 using left internal mammary artery and bilateral greater saphenous veins harvested laparoscopically (N/A) MAZE (N/A) TRANSESOPHAGEAL ECHOCARDIOGRAM (TEE) (N/A) CLIPPING OF ATRIAL APPENDAGE (N/A)  Subjective: Opens eyes.  Intermittently has followed some simple commands on left side.  Not moving right side.  Not speaking at all.  Objective: Vital signs: BP Readings from Last 1 Encounters:  10/21/14 125/59   Pulse Readings from Last 1 Encounters:  10/21/14 70   Resp Readings from Last 1 Encounters:  10/21/14 19   Temp Readings from Last 1 Encounters:  10/21/14 98.9 F (37.2 C) Oral    Hemodynamics:    Physical Exam:  Rhythm:   sinus  Breath sounds: clear  Heart sounds:  RRR  Incisions:  Clean and dry  Abdomen:  Soft, non-distended  Extremities:  Warm, well-perfused, swollen   Intake/Output from previous day: 08/21 0701 - 08/22 0700 In: 1023.6 [I.V.:723.6; IV Piggyback:300] Out: 4675 [Urine:4675] Intake/Output this shift:    Lab Results:  CBC: Recent Labs  10/20/14 0557 10/20/14 1607 10/21/14 0419  WBC 11.8*  --  13.2*  HGB 8.4* 9.5* 9.5*  HCT 25.0* 28.0* 29.3*  PLT 116*  --  152    BMET:  Recent Labs  10/20/14 0557 10/20/14 1607 10/21/14 0419  NA 140 138 140  K 2.7* 3.1* 3.2*  CL 101 100* 97*  CO2 28  --  31  GLUCOSE 74 90 93  BUN 27* 27* 27*  CREATININE 1.45* 1.50* 1.37*  CALCIUM 7.5*  --  7.4*     PT/INR:   Recent Labs  10/20/14 0557  LABPROT 15.2  INR 1.18    CBG (last 3)   Recent Labs  10/20/14 2207 10/20/14 2357 10/21/14 0348  GLUCAP 90 86 90    ABG    Component Value Date/Time   PHART 7.490* 10/21/2014 0348   PCO2ART 42.3 10/21/2014 0348   PO2ART 80.0 10/21/2014 0348   HCO3 32.2* 10/21/2014 0348   TCO2 33 10/21/2014 0348   ACIDBASEDEF 7.0* 10/17/2014 0313   O2SAT 70.4 10/21/2014 0355    CXR: Mild diffuse opacity and LLL atelectasis, overall looks pretty good  Assessment/Plan: S/P Procedure(s) (LRB): AORTIC VALVE REPLACEMENT (AVR)/CORONARY ARTERY BYPASS GRAFTING (CABG) x4 using left internal mammary artery and bilateral greater saphenous veins harvested laparoscopically (N/A) MAZE (N/A) TRANSESOPHAGEAL ECHOCARDIOGRAM (TEE) (N/A) CLIPPING OF ATRIAL APPENDAGE (N/A)  Maintaining NSR and stable BP Resp status stable since extubation S/P left MCA embolic stroke with marginal improvement in neurologic status Expected post op acute blood loss anemia, stable Expected post op volume excess, diuresing well Expected post op atelectasis, mild Type II diabetes mellitus, excellent glycemic control   Wean dopamine off  D/C Aline  Insert PICC and D/C old central line  Place Panda tube and start tube feeds once tip post-pyloric  SCD's for DVT prophylaxis   [name] 10/21/2014 7:45 AM

## 2014-10-21 NOTE — Progress Notes (Signed)
PULMONARY / CRITICAL CARE MEDICINE   Name: Charles [name] MRN: [id number] DOB: Jan 09, 1941    ADMISSION DATE:  10/16/2014 CONSULTATION DATE:  10/16/14   REFERRING MD :  Dr Cornelius Moras  REASON FOR CONSULTATION: Acute Respiratory Failure  INITIAL PRESENTATION: 74 yo obese man with hx COPD, restrictive lung disease, OSA/ OHS, chronic A fib, CAD with stable angina, Aortic Stenosis. He was admitted for elective CABG + AVR + Maze, performed on 10/16/14. He returned to the ICU intubated and sedated. Course c/b L MCA CVA  STUDIES:  L heart cath 07/18/14 >> severe three-vessel coronary artery disease with moderate aortic stenosis and elevated left ventricular filling pressures. Mean transvalvular gradient was reported 23.4 mmHg. Ejection fraction was estimated 45-50% PFT 08/12/14 >> combined restriction and obstruction resulting in severely decreased FEV1 1.72L (50% pred), positive BD response, restriction on lung volumes, decreased DLCO that almost fully corrects when adjusted for Va Head CT 8/19 >> Large L MCA distribution CVA, no associated shift or bleed  SIGNIFICANT EVENTS: 8/17 > CABG + AVR + Maze  SUBJECTIVE:  Extubated No resp distress  no obvious pain afebrile  VITAL SIGNS: Temp:  [98.5 F (36.9 C)-100 F (37.8 C)] 98.5 F (36.9 C) (08/22 0729) Pulse Rate:  [53-82] 70 (08/22 0845) Resp:  [13-22] 18 (08/22 0845) BP: (109-199)/(51-74) 122/55 mmHg (08/22 0800) SpO2:  [92 %-100 %] 98 % (08/22 0845) Arterial Line BP: (131-181)/(34-59) 133/44 mmHg (08/22 0845) FiO2 (%):  [40 %] 40 % (08/21 1200) Weight:  [279 lb 5.2 oz (126.7 kg)] 279 lb 5.2 oz (126.7 kg) (08/22 0500) HEMODYNAMICS:   VENTILATOR SETTINGS: Vent Mode:  [-] PSV;CPAP FiO2 (%):  [40 %] 40 % PEEP:  [5 cmH20] 5 cmH20 Pressure Support:  [10 cmH20] 10 cmH20 INTAKE / OUTPUT:  Intake/Output Summary (Last 24 hours) at 10/21/14 1019 Last data filed at 10/21/14 0842  Gross per 24 hour  Intake 1057.5 ml  Output   5100 ml  Net  -4042.5 ml    PHYSICAL EXAMINATION: General:  Obese man, awake Neuro:   follows commands,  neglecting the R, moved his L UE  & LLE HEENT:  Pupils 4mm  Cardiovascular:  istant but regular Lungs:  Distant, decreased at bases, B basilar insp crackles Abdomen:  Obese, non-tender, no BS heard Musculoskeletal:  L LE wrapped, no deformities, no significant edema Skin:  No rash  LABS:  CBC  Recent Labs Lab 10/19/14 0518  10/20/14 0557 10/20/14 1607 10/21/14 0419  WBC 13.8*  --  11.8*  --  13.2*  HGB 9.0*  < > 8.4* 9.5* 9.5*  HCT 26.6*  < > 25.0* 28.0* 29.3*  PLT 93*  --  116*  --  152  < > = values in this interval not displayed. Coag's  Recent Labs Lab 10/14/14 1335 10/16/14 1815 10/20/14 0557  APTT 32 43*  --   INR 1.13 1.89* 1.18   BMET  Recent Labs Lab 10/19/14 0518  10/20/14 0557 10/20/14 1607 10/21/14 0419  NA 139  < > 140 138 140  K 3.1*  < > 2.7* 3.1* 3.2*  CL 101  < > 101 100* 97*  CO2 26  --  28  --  31  BUN 24*  < > 27* 27* 27*  CREATININE 1.51*  < > 1.45* 1.50* 1.37*  GLUCOSE 105*  < > 74 90 93  < > = values in this interval not displayed. Electrolytes  Recent Labs Lab 10/17/14 0317 10/17/14 1200 10/17/14 1711  10/19/14 0518 10/20/14 0557 10/21/14 0419  CALCIUM 7.8* 8.2*  --   < > 7.7* 7.5* 7.4*  MG 2.8* 2.6* 2.5*  --   --   --   --   < > = values in this interval not displayed. Sepsis Markers No results for input(s): LATICACIDVEN, PROCALCITON, O2SATVEN in the last 168 hours. ABG  Recent Labs Lab 10/20/14 1513 10/20/14 2211 10/21/14 0348  PHART 7.492* 7.519* 7.490*  PCO2ART 40.1 40.7 42.3  PO2ART 69.0* 92.0 80.0   Liver Enzymes  Recent Labs Lab 10/14/14 1335 10/18/14 0500  AST 36 136*  ALT 16* 23  ALKPHOS 69 39  BILITOT 0.8 1.6*  ALBUMIN 3.7 2.9*   Cardiac Enzymes No results for input(s): TROPONINI, PROBNP in the last 168 hours. Glucose  Recent Labs Lab 10/20/14 1601 10/20/14 2016 10/20/14 2207 10/20/14 2357  10/21/14 0348 10/21/14 0841  GLUCAP 76 77 90 86 90 98    Imaging Ct Head Wo Contrast  10/20/2014   CLINICAL DATA:  Acute left middle cerebral artery territory infarction. Worsening mental status, not following commands. Follow-up for changes.  EXAM: CT HEAD WITHOUT CONTRAST  TECHNIQUE: Contiguous axial images were obtained from the base of the skull through the vertex without intravenous contrast.  COMPARISON:  10/18/2014  FINDINGS: Very similar appearance to the study of 2 days ago. Low-density affecting the left temporal lobe, posterior frontal lobe, parietal lobe and basal ganglia consistent with near complete left MCA territory infarction. Low-density in swelling with mass effect upon the left lateral ventricle but only 2-3 mm of left-to-right shift. No evidence of hemorrhagic transformation. No other acute intracranial infarction. Brainstem and cerebellum are normal. Mild chronic small-vessel changes of the right cerebral hemisphere. No ventricular trapping.  IMPRESSION: No significant change since the study of 2 days ago. Low-density in swelling throughout the left MCA territory consistent with subtotal left MCA territory infarction. Mass effect with left-to-right shift of only 2-3 mm. No hemorrhagic transformation.   Electronically Signed   By: Paulina Fusi M.D.   On: 10/20/2014 11:08   Dg Chest Port 1 View  10/21/2014   CLINICAL DATA:  Status post aortic valve replacement and CABG on October 16, 2014  EXAM: PORTABLE CHEST - 1 VIEW  COMPARISON:  Portable chest x-ray of October 20, 2014  FINDINGS: The lungs are adequately inflated. The retrocardiac region on the left remains dense and the left hemidiaphragm obscured. The cardiac silhouette is mildly enlarged. The pulmonary vascularity is prominent centrally but slightly less engorged. The patient is rotated on this study. There are 8 intact sternal wires. A left atrial appendage clip is present. Two coronary artery bypass graft markers are visible.  The prosthetic aortic valve is faintly visible.  IMPRESSION: Slight interval improvement in the appearance of the pulmonary vascularity since yesterday's study. There is persistent left lower lobe atelectasis and small left pleural effusion.   Electronically Signed   By: David  [place] M.D.   On: 10/21/2014 07:47   CXR 8/22 reviewed by me > shows L vol loss  ASSESSMENT / PLAN:  PULMONARY OETT 8/17 >> 8/20 R chest tube 8/17 >> out L chest tubes 8/17 >> out A: Acute respiratory failure COPD OSA / Obesity hypoventilation syndrome Stable calcified RUL nodule 2 smaller LUL nodules on CT 07/2014, unclear how long they have been present P:   Pulm hygiene -remains aspiration risk Scheduled BD's; suspect he needs daily BD as an outpt as well If stabizes, Will try to obtain his old  CT chest from DC 2010, determine if the LUL nodules are new as of our 2016 CT. If so then  follow with serial films.   CARDIOVASCULAR R IJ CVC + PA-c 8/17 >> 8/19 L radial art line 8/17 >> 8/22 A: s/p CABG, AVR, Maze Cardiogenic Shock post-op Chronic Systolic and Diastolic CHF Atrial fib Hx HTN P:  Lasix gtt    RENAL A:  Acute renal insufficiency, stable w active diuresis Hypokalemia  P:   replete lytes as needed  GASTROINTESTINAL A:  SUP P:   NPO Swallow eval PPI  HEMATOLOGIC A:  Thrombocytopenia Anemia, acute blood loss, improving P:  Follow CBC   INFECTIOUS A:  No evidence active infection P:   Cefuroxime and vanco surgical prophylaxis 8/17 >> 8/18  ENDOCRINE A:  DM   P:   Off Insulin gtt levemir held while npo - SSI   NEUROLOGIC A:  Acute Large L MCA CVA P:   Neurology following.   morphine prn PT consult  FAMILY  - Updates: Dr Cornelius Moras has discussed code status with his nephew. Does not sound like Mr Newstrom would want prolonged invasive care, trach, etc.    - Inter-disciplinary family meet or Palliative Care meeting due by:  10/23/14    TODAY'S SUMMARY:  Post-op CABG,  AVR, Maze procedure.  Complicated by CVA. Extuabted, will see how he progresses    Cyril Mourning MD. FCCP. [facility] Pager [phone] If no response call [phone]      10/21/2014, 10:18 AM

## 2014-10-21 NOTE — Progress Notes (Signed)
STROKE TEAM PROGRESS NOTE   HISTORY Giovoni [name] is a 74 y.o. male with hx COPD, restrictive lung disease, OSA/ OHS, chronic A fib, CAD with stable angina, Aortic Stenosis. He was admitted for elective CABG + AVR + Maze, performed on 10/16/14. He returned to the ICU intubated and sedated. Sedation discontinued approximately 24 hours ago and patient has not regained presurgical mental status. There has been some spontaneous movement on the left but no movement noted on the right. He is not following commands. Consult called for further recommendations.   Date last known well: Date: 10/16/2014 Time last known well: Time: 07:48 tPA Given: No: Outside time window, s/p major surgery   SUBJECTIVE (INTERVAL HISTORY) His RN is at the bedside. Patient is going for swallow study His eyes open, still has right neglect and hemiplegia. Not change of mental status.  repeat CT head shows no change from study 2 days ago.    OBJECTIVE Temp:  [97.1 F (36.2 C)-98.9 F (37.2 C)] 98.7 F (37.1 C) (08/22 1249) Pulse Rate:  [53-82] 70 (08/22 1200) Cardiac Rhythm:  [-] Ventricular paced (08/22 0800) Resp:  [13-22] 22 (08/22 1200) BP: (109-199)/(52-74) 141/64 mmHg (08/22 1200) SpO2:  [92 %-100 %] 98 % (08/22 1200) Arterial Line BP: (131-181)/(34-66) 169/63 mmHg (08/22 1200) Weight:  [279 lb 5.2 oz (126.7 kg)] 279 lb 5.2 oz (126.7 kg) (08/22 0500)   Recent Labs Lab 10/20/14 2016 10/20/14 2207 10/20/14 2357 10/21/14 0348 10/21/14 0841  GLUCAP 77 90 86 90 98    Recent Labs Lab 10/17/14 0317 10/17/14 1200  10/17/14 1711 10/18/14 0500 10/19/14 0518 10/19/14 1807 10/20/14 0557 10/20/14 1607 10/21/14 0419  NA 141 141  < >  --  140 139 139 140 138 140  K 2.7* 3.3*  < >  --  3.9 3.1* 3.0* 2.7* 3.1* 3.2*  CL 108 107  < >  --  106 101 98* 101 100* 97*  CO2 20* 25  --   --  26 26  --  28  --  31  GLUCOSE 253* 114*  < >  --  120* 105* 135* 74 90 93  BUN 22* 21*  < >  --  21* 24* 27* 27* 27*  27*  CREATININE 1.69* 1.53*  < > 1.63* 1.50* 1.51* 1.40* 1.45* 1.50* 1.37*  CALCIUM 7.8* 8.2*  --   --  8.0* 7.7*  --  7.5*  --  7.4*  MG 2.8* 2.6*  --  2.5*  --   --   --   --   --   --   < > = values in this interval not displayed.  Recent Labs Lab 10/14/14 1335 10/18/14 0500  AST 36 136*  ALT 16* 23  ALKPHOS 69 39  BILITOT 0.8 1.6*  PROT 6.9 4.7*  ALBUMIN 3.7 2.9*    Recent Labs Lab 10/17/14 1711 10/18/14 0500 10/19/14 0518 10/19/14 1807 10/20/14 0557 10/20/14 1607 10/21/14 0419  WBC 11.9* 15.6* 13.8*  --  11.8*  --  13.2*  HGB 8.2* 9.0* 9.0* 8.8* 8.4* 9.5* 9.5*  HCT 23.6* 25.7* 26.6* 26.0* 25.0* 28.0* 29.3*  MCV 85.8 85.7 88.4  --  89.0  --  91.3  PLT 90* 101* 93*  --  116*  --  152   No results for input(s): CKTOTAL, CKMB, CKMBINDEX, TROPONINI in the last 168 hours.  Recent Labs  10/20/14 0557  LABPROT 15.2  INR 1.18   No results for input(s): COLORURINE, LABSPEC, PHURINE,  GLUCOSEU, HGBUR, BILIRUBINUR, KETONESUR, PROTEINUR, UROBILINOGEN, NITRITE, LEUKOCYTESUR in the last 72 hours.  Invalid input(s): APPERANCEUR     Component Value Date/Time   CHOL 136 06/05/2014 1211   TRIG 80.0 06/05/2014 1211   HDL 24.20* 06/05/2014 1211   CHOLHDL 6 06/05/2014 1211   VLDL 16.0 06/05/2014 1211   LDLCALC 96 06/05/2014 1211   Lab Results  Component Value Date   HGBA1C 8.9* 10/14/2014   No results found for: LABOPIA, COCAINSCRNUR, LABBENZ, AMPHETMU, THCU, LABBARB  No results for input(s): ETH in the last 168 hours.   IMAGING  Dg Chest 1 View 10/18/2014    1. Endotracheal tube has been advanced. Its tip is 2.3 cm above the carina. Remaining lines and tubes in stable position.  2. Prior CABG and aortic valve replacement. Persistent cardiomegaly. Increased interstitial markings suggesting mild congestive heart failure.  3. Low lung volumes with bibasilar atelectasis.     Ct Head Wo Contrast 10/18/2014    Acute infarct involving essentially the entire left middle  cerebral artery distribution with partial effacement of the left lateral ventricle. There is increased attenuation in the left middle cerebral artery consistent with thrombosis of this vessel. No midline shift. No hemorrhage. There is mild underlying atrophy with mild periventricular small vessel disease.    TEE Intraoperative   10/16/2014 Impressions: - LV systolic function baseline to mildly diminished, Right heart with moderate dysfunction, mild MR at baseline, no AI with well seated and function AV, no PFO or new ASD, no signs of dissection, TV with no TR, PAC in main PA, no signs of significant pleural effusion.  CT repeat 10/20/14 No significant change since the study of 2 days ago. Low-density in swelling throughout the left MCA territory consistent with subtotal left MCA territory infarction. Mass effect with left-to-right shift of only 2-3 mm. No hemorrhagic transformation  CUS - preCABG 10/14/14 :1. Right: 40-59% ICA stensosis. Left: 60-79% ICA stenosis. 2. Right vertebral antegrade. Left vertebral flow not found.   PHYSICAL EXAM  Temp:  [97.1 F (36.2 C)-98.9 F (37.2 C)] 98.7 F (37.1 C) (08/22 1249) Pulse Rate:  [53-82] 70 (08/22 1200) Resp:  [13-22] 22 (08/22 1200) BP: (109-199)/(52-74) 141/64 mmHg (08/22 1200) SpO2:  [92 %-100 %] 98 % (08/22 1200) Arterial Line BP: (131-181)/(34-66) 169/63 mmHg (08/22 1200) Weight:  [279 lb 5.2 oz (126.7 kg)] 279 lb 5.2 oz (126.7 kg) (08/22 0500)  General - obese elderly Caucasian male not in distress  Ophthalmologic - fundi not visualized due to noncooperation.  Cardiovascular - Regular rate and rhythm, not in afib.  Neuro - awake,  Globally aphasic and not following commands. Right visual neglect, blinking to visual threat on the left but not on the right, PERRL, eyes left gaze preference and barely cross to midline. Right nasolabial fold flattening. RUE flaccid, 0/5 on pain stimulatin, RLE flaccid but increased muscle  tone, trace withdraw on pain stimulatin. LUE and LLE spontaneous movement. Right babinski positive.  ASSESSMENT/PLAN Charles [name] is a 74 y.o. male with history of diabetes mellitus, hypertension, copd, aortic stenosis, atrial fibrillation (Xarelto in the past DC'd secondary to hematuria), visual impairment, pulmonary nodule, previous stroke in 1997, asthma, and elective CABG + AVR + Maze, performed on 10/16/14.  presenting with altered mental status and right hemiplegia. He did not receive IV t-PA due to recent surgery.  Stroke:  Left MCA large infarct - embolic from known Afib not on [facility] or cardiac procedure related.  Resultant  Aphasia, right neglect and  right hemiplegia.  MRI  not needed as it will not change management  MRA  not needed as it will not change management  CUS - 40-59% RICA stensosis. 60-79% LICA stenosis. Right vertebral antegrade. Left vertebral flow not found.  Recommend CTA neck once Cre normalizes  TEE intraoperative EF 40 -45%. No PFO or cardiac clot.  LDL 96  HgbA1c 8.9  SCDs for VTE prophylaxis Diet NPO time specified  aspirin 81 mg orally every day prior to admission, now on aspirin 325 mg orally every day. Due to afib, pt should be on [facility] for stroke prevention. Currently pt has large MCA infarct, to avoid hemorrhagic transformation, will recommend start AC at 10 days post stroke if not contraindicated. Due to valvular afib, coumadin may be the choice for anticoagulation. Recommend start coumadin at day 7, no need bridge and with expection to reach INR 2-3 at day 10 post stroke.   Ongoing aggressive stroke risk factor management  Therapy recommendations:  Pending  Disposition:  Pending  Cerebral edema  Mass effect on CT with ventrical compression  No mid line shift on last CT  Repeat CT in am to evaluate cerebral edema  Has central line placed  May consider 3% saline if needed  Chronic afib  Not on [facility] but on ASA at home  Has home  amiodarone  Last INR check 10/16/14 - 1.89 - will repeat in am  Due to high CHA2DS2-VASc, recommend anticoagulation at 7-10 days after stroke to avoid hemorrhagic transformation as the left MCA stroke is large. Recommend coumadin at day 7 without bridging and with expection to reach INR 2-3 at day 10 post stroke.  Cardiology on board  S/p MAZE procedure         Condition Points   C CHF (or Left ventricular systolic dysfunction) 1   H HTN: BP consistently above 140/90 mmHg (or treated HTN on meds)  1   A2 Age ?75 years 2   D DM 1   S2 Prior Stroke or TIA or thromboembolism 2   V Vascular disease (e.g. PAD, MI, aortic plaque) 1   A Age 71-74 years 0   [place] male sex  0           Annual Stroke Risk CHA2DS2-VASc Score Stroke Risk % 95% CI  0 0  1 1.3  2 2.2  3 3.2  4 4.0  5 6.7  6 9.8  7 9.6  8 12.5  9 15.2      CAD s/p CABG and AVR  10/16/14 done  Due to AVR, pt likely to have coumadin for anticoagulation  CTS on board  Hypertension  Home meds: Atenolol, hydrochlorothiazide, and lisinopril  stable on Dopamine  Avoid hypotension to decrease cerebral hypoperfusion  Hyperlipidemia  Home meds: No lipid lowering medications prior to admission.  LDL 96, goal < 70  Currently AST 136 and ALT 23  May consider statin once AST normalizes  Diabetes  HgbA1c 8.9, goal < 7.0  Uncontrolled  On levemir  SSI  Management as per primary team  Other Stroke Risk Factors  Advanced age  Cigarette smoker, quit smoking in 1992   ETOH use  Obesity, Body mass index is 35.85 kg/(m^2).   Other Active Problems  Hypokalemia  Postop Anemia  Leukocytosis  Renal insufficiency  Elevated INR  Other Pertinent History    Hospital day # 5  This patient is critically ill due to large MCA infarct, coagulopathy,  , s/p AVR +  CABG + MAZE procedure, afib not on AC and at significant risk of neurological worsening, death form hemorrhagic transformation, recurrent  stroke, heart failure, kidney failure, seizure, and respiratory failure . Check swallow eval today. Delia Heady, MD Stroke Neurology 10/21/2014 12:58 PM   To contact Stroke Continuity provider, please refer to [email or web address]. After hours, contact General Neurology

## 2014-10-21 NOTE — Progress Notes (Signed)
Peripherally Inserted Central Catheter/Midline Placement  The IV Nurse has discussed with the patient and/or persons authorized to consent for the patient, the purpose of this procedure and the potential benefits and risks involved with this procedure.  The benefits include less needle sticks, lab draws from the catheter and patient may be discharged home with the catheter.  Risks include, but not limited to, infection, bleeding, blood clot (thrombus formation), and puncture of an artery; nerve damage and irregular heat beat.  Alternatives to this procedure were also discussed.  PICC/Midline Placement Documentation  PICC / Midline Double Lumen 10/21/14 PICC Right Cephalic 45 cm 0 cm (Active)  Indication for Insertion or Continuance of Line Prolonged intravenous therapies 10/21/2014  7:00 PM  Exposed Catheter (cm) 0 cm 10/21/2014  7:00 PM  Dressing Change Due 10/28/14 10/21/2014  7:00 PM       Stacie Glaze Horton 10/21/2014, 7:21 PM

## 2014-10-21 NOTE — Progress Notes (Signed)
Patient ID: Charles [name], male   DOB: Aug 29, 1940, 74 y.o.   MRN: [id number]  SICU Evening Rounds:  Hemodynamically stable off dopamine.   Diuresing well on lasix drip. Repeat BMET this pm.  Having PICC line placed at this time.

## 2014-10-21 NOTE — Consult Note (Signed)
WOC wound consult note Reason for Consult: suspected deep tissue injury, sacrum Pt admitted 10/16/14 for AVR, CABGx 4, MAZE procedure, and TEE, lengthy OR case. Sacral prophylactic dressing placed prior to his case. Documented 0500 10/16/14. Pt returned from the OR and has since suffered left large MCA infarct with midline shift.  Pt is extubated but is not responsive Wound type:sDTI (supected deep tissue injury) Pressure Ulcer POA: No Measurement:11cm x 6cm x 0.1c  Wound ZOX:WRUE, dark maroon tissue with superficial blistering  Drainage (amount, consistency, odor) serous Periwound:intact  Dressing procedure/placement/frequency: Continue silicone foam for protection and absorption of drainage. Will need bedside nurses to monitor closely for evolution of this wound.  Placed on SPORT/LALM at the time of discovery.  WOC team will follow along with you for weekly wound assessments.  Please notify me of any acute changes in the wounds or any new areas of concerns Armen Pickup RN,CWOCN [phone]

## 2014-10-22 DIAGNOSIS — Z515 Encounter for palliative care: Secondary | ICD-10-CM

## 2014-10-22 LAB — GLUCOSE, CAPILLARY
GLUCOSE-CAPILLARY: 178 mg/dL — AB (ref 65–99)
GLUCOSE-CAPILLARY: 188 mg/dL — AB (ref 65–99)
GLUCOSE-CAPILLARY: 222 mg/dL — AB (ref 65–99)
Glucose-Capillary: 129 mg/dL — ABNORMAL HIGH (ref 65–99)
Glucose-Capillary: 181 mg/dL — ABNORMAL HIGH (ref 65–99)
Glucose-Capillary: 186 mg/dL — ABNORMAL HIGH (ref 65–99)
Glucose-Capillary: 187 mg/dL — ABNORMAL HIGH (ref 65–99)
Glucose-Capillary: 187 mg/dL — ABNORMAL HIGH (ref 65–99)
Glucose-Capillary: 199 mg/dL — ABNORMAL HIGH (ref 65–99)

## 2014-10-22 LAB — CBC
HEMATOCRIT: 30.1 % — AB (ref 39.0–52.0)
HEMOGLOBIN: 9.8 g/dL — AB (ref 13.0–17.0)
MCH: 30 pg (ref 26.0–34.0)
MCHC: 32.6 g/dL (ref 30.0–36.0)
MCV: 92 fL (ref 78.0–100.0)
Platelets: 208 10*3/uL (ref 150–400)
RBC: 3.27 MIL/uL — ABNORMAL LOW (ref 4.22–5.81)
RDW: 15.4 % (ref 11.5–15.5)
WBC: 11.7 10*3/uL — ABNORMAL HIGH (ref 4.0–10.5)

## 2014-10-22 LAB — BASIC METABOLIC PANEL
Anion gap: 11 (ref 5–15)
BUN: 33 mg/dL — ABNORMAL HIGH (ref 6–20)
CHLORIDE: 96 mmol/L — AB (ref 101–111)
CO2: 33 mmol/L — AB (ref 22–32)
CREATININE: 1.39 mg/dL — AB (ref 0.61–1.24)
Calcium: 7.5 mg/dL — ABNORMAL LOW (ref 8.9–10.3)
GFR calc non Af Amer: 48 mL/min — ABNORMAL LOW (ref >60)
GFR, EST AFRICAN AMERICAN: 56 mL/min — AB (ref >60)
Glucose, Bld: 201 mg/dL — ABNORMAL HIGH (ref 65–99)
Potassium: 3.4 mmol/L — ABNORMAL LOW (ref 3.5–5.1)
Sodium: 140 mmol/L (ref 135–145)

## 2014-10-22 MED ORDER — FUROSEMIDE 10 MG/ML IJ SOLN
40.0000 mg | Freq: Two times a day (BID) | INTRAMUSCULAR | Status: DC
  Administered 2014-10-23 – 2014-10-24 (×3): 40 mg via INTRAVENOUS
  Filled 2014-10-22 (×7): qty 4

## 2014-10-22 MED ORDER — VITAL AF 1.2 CAL PO LIQD
1000.0000 mL | ORAL | Status: DC
  Filled 2014-10-22 (×3): qty 1000

## 2014-10-22 MED ORDER — METOPROLOL TARTRATE 25 MG/10 ML ORAL SUSPENSION
50.0000 mg | Freq: Two times a day (BID) | ORAL | Status: DC
  Filled 2014-10-22: qty 20

## 2014-10-22 MED ORDER — GLYCOPYRROLATE 0.2 MG/ML IJ SOLN
0.1000 mg | Freq: Once | INTRAMUSCULAR | Status: AC
  Administered 2014-10-22: 0.1 mg via INTRAVENOUS
  Filled 2014-10-22: qty 0.5

## 2014-10-22 MED ORDER — DIAZEPAM 5 MG/ML IJ SOLN
5.0000 mg | Freq: Every day | INTRAMUSCULAR | Status: DC
  Administered 2014-10-22 – 2014-10-23 (×2): 5 mg via INTRAVENOUS
  Filled 2014-10-22 (×2): qty 2

## 2014-10-22 MED ORDER — BIOTENE DRY MOUTH MT LIQD: 15.0000 mL | OROMUCOSAL | Status: DC | PRN

## 2014-10-22 MED ORDER — LORAZEPAM 2 MG/ML IJ SOLN
1.0000 mg | INTRAMUSCULAR | Status: DC | PRN
  Filled 2014-10-22: qty 1

## 2014-10-22 MED ORDER — POTASSIUM CHLORIDE 10 MEQ/50ML IV SOLN
10.0000 meq | INTRAVENOUS | Status: AC
  Administered 2014-10-22 (×3): 10 meq via INTRAVENOUS

## 2014-10-22 MED ORDER — POTASSIUM CHLORIDE 10 MEQ/50ML IV SOLN
INTRAVENOUS | Status: AC
  Filled 2014-10-22: qty 50

## 2014-10-22 MED ORDER — INSULIN DETEMIR 100 UNIT/ML ~~LOC~~ SOLN
20.0000 [IU] | Freq: Two times a day (BID) | SUBCUTANEOUS | Status: DC
  Administered 2014-10-22: 20 [IU] via SUBCUTANEOUS
  Filled 2014-10-22 (×2): qty 0.2

## 2014-10-22 MED ORDER — VITAL HIGH PROTEIN PO LIQD: 1000.0000 mL | ORAL | Status: DC

## 2014-10-22 MED ORDER — POLYVINYL ALCOHOL 1.4 % OP SOLN
1.0000 [drp] | Freq: Four times a day (QID) | OPHTHALMIC | Status: DC | PRN
Start: 2014-10-22 — End: 2014-10-24
  Administered 2014-10-24: 1 [drp] via OPHTHALMIC
  Filled 2014-10-22: qty 15

## 2014-10-22 MED ORDER — VITAL AF 1.2 CAL PO LIQD: 1000.0000 mL | ORAL | Status: DC

## 2014-10-22 NOTE — Progress Notes (Signed)
Rehab Admissions Coordinator Note:  Patient was screened by Trish Mage for appropriateness for an Inpatient Acute Rehab Consult.  At this time, we are recommending Inpatient Rehab consult.  Trish Mage 10/22/2014, 1:38 PM  I can be reached at [phone].

## 2014-10-22 NOTE — Progress Notes (Signed)
STROKE TEAM PROGRESS NOTE   HISTORY Charles [name] is a 74 y.o. male with hx COPD, restrictive lung disease, OSA/ OHS, chronic A fib, CAD with stable angina, Aortic Stenosis. He was admitted for elective CABG + AVR + Maze, performed on 10/16/14. He returned to the ICU intubated and sedated. Sedation discontinued approximately 24 hours ago and patient has not regained presurgical mental status. There has been some spontaneous movement on the left but no movement noted on the right. He is not following commands. Consult called for further recommendations.   Date last known well: Date: 10/16/2014 Time last known well: Time: 07:48 tPA Given: No: Outside time window, s/p major surgery   SUBJECTIVE (INTERVAL HISTORY) His RN , friends and nephew are at the bedside.  Aphasia and right hemiplegia remains unchanged    OBJECTIVE Temp:  [97.2 F (36.2 C)-99.2 F (37.3 C)] 98.5 F (36.9 C) (08/23 1100) Pulse Rate:  [63-77] 77 (08/23 0800) Cardiac Rhythm:  [-] Normal sinus rhythm (08/23 0800) Resp:  [15-23] 21 (08/23 0800) BP: (91-169)/(39-88) 112/51 mmHg (08/23 0800) SpO2:  [93 %-100 %] 96 % (08/23 0844) Weight:  [271 lb 9.7 oz (123.2 kg)] 271 lb 9.7 oz (123.2 kg) (08/23 0450)   Recent Labs Lab 10/21/14 1657 10/21/14 1958 10/21/14 2053 10/22/14 0030 10/22/14 0436  GLUCAP 140* 174* 181* 178* 187*    Recent Labs Lab 10/17/14 0317 10/17/14 1200  10/17/14 1711  10/19/14 0518  10/20/14 0557 10/20/14 1607 10/21/14 0419 10/21/14 2109 10/22/14 0506  NA 141 141  < >  --   < > 139  < > 140 138 140 141 140  K 2.7* 3.3*  < >  --   < > 3.1*  < > 2.7* 3.1* 3.2* 3.7 3.4*  CL 108 107  < >  --   < > 101  < > 101 100* 97* 97* 96*  CO2 20* 25  --   --   < > 26  --  28  --  31 33* 33*  GLUCOSE 253* 114*  < >  --   < > 105*  < > 74 90 93 181* 201*  BUN 22* 21*  < >  --   < > 24*  < > 27* 27* 27* 32* 33*  CREATININE 1.69* 1.53*  < > 1.63*  < > 1.51*  < > 1.45* 1.50* 1.37* 1.46* 1.39*   CALCIUM 7.8* 8.2*  --   --   < > 7.7*  --  7.5*  --  7.4* 7.4* 7.5*  MG 2.8* 2.6*  --  2.5*  --   --   --   --   --   --   --   --   < > = values in this interval not displayed.  Recent Labs Lab 10/18/14 0500  AST 136*  ALT 23  ALKPHOS 39  BILITOT 1.6*  PROT 4.7*  ALBUMIN 2.9*    Recent Labs Lab 10/18/14 0500 10/19/14 0518 10/19/14 1807 10/20/14 0557 10/20/14 1607 10/21/14 0419 10/22/14 0506  WBC 15.6* 13.8*  --  11.8*  --  13.2* 11.7*  HGB 9.0* 9.0* 8.8* 8.4* 9.5* 9.5* 9.8*  HCT 25.7* 26.6* 26.0* 25.0* 28.0* 29.3* 30.1*  MCV 85.7 88.4  --  89.0  --  91.3 92.0  PLT 101* 93*  --  116*  --  152 208   No results for input(s): CKTOTAL, CKMB, CKMBINDEX, TROPONINI in the last 168 hours.  Recent Labs  10/20/14  0557  LABPROT 15.2  INR 1.18   No results for input(s): COLORURINE, LABSPEC, PHURINE, GLUCOSEU, HGBUR, BILIRUBINUR, KETONESUR, PROTEINUR, UROBILINOGEN, NITRITE, LEUKOCYTESUR in the last 72 hours.  Invalid input(s): APPERANCEUR     Component Value Date/Time   CHOL 136 06/05/2014 1211   TRIG 80.0 06/05/2014 1211   HDL 24.20* 06/05/2014 1211   CHOLHDL 6 06/05/2014 1211   VLDL 16.0 06/05/2014 1211   LDLCALC 96 06/05/2014 1211   Lab Results  Component Value Date   HGBA1C 8.9* 10/14/2014   No results found for: LABOPIA, COCAINSCRNUR, LABBENZ, AMPHETMU, THCU, LABBARB  No results for input(s): ETH in the last 168 hours.   IMAGING  Dg Chest 1 View 10/18/2014    1. Endotracheal tube has been advanced. Its tip is 2.3 cm above the carina. Remaining lines and tubes in stable position.  2. Prior CABG and aortic valve replacement. Persistent cardiomegaly. Increased interstitial markings suggesting mild congestive heart failure.  3. Low lung volumes with bibasilar atelectasis.     Ct Head Wo Contrast 10/18/2014    Acute infarct involving essentially the entire left middle cerebral artery distribution with partial effacement of the left lateral ventricle. There is  increased attenuation in the left middle cerebral artery consistent with thrombosis of this vessel. No midline shift. No hemorrhage. There is mild underlying atrophy with mild periventricular small vessel disease.    TEE Intraoperative   10/16/2014 Impressions: - LV systolic function baseline to mildly diminished, Right heart with moderate dysfunction, mild MR at baseline, no AI with well seated and function AV, no PFO or new ASD, no signs of dissection, TV with no TR, PAC in main PA, no signs of significant pleural effusion.  CT repeat 10/20/14 No significant change since the study of 2 days ago. Low-density in swelling throughout the left MCA territory consistent with subtotal left MCA territory infarction. Mass effect with left-to-right shift of only 2-3 mm. No hemorrhagic transformation  CUS - preCABG 10/14/14 :1. Right: 40-59% ICA stensosis. Left: 60-79% ICA stenosis. 2. Right vertebral antegrade. Left vertebral flow not found.   PHYSICAL EXAM  Temp:  [97.2 F (36.2 C)-99.2 F (37.3 C)] 98.5 F (36.9 C) (08/23 1100) Pulse Rate:  [63-77] 77 (08/23 0800) Resp:  [15-23] 21 (08/23 0800) BP: (91-169)/(39-88) 112/51 mmHg (08/23 0800) SpO2:  [93 %-100 %] 96 % (08/23 0844) Weight:  [271 lb 9.7 oz (123.2 kg)] 271 lb 9.7 oz (123.2 kg) (08/23 0450)  General - obese elderly Caucasian male not in distress  Ophthalmologic - fundi not visualized due to noncooperation.  Cardiovascular - Regular rate and rhythm, not in afib.  Neuro - awake,  Globally aphasic and not following commands. Right visual neglect, blinking to visual threat on the left but not on the right, PERRL, eyes left gaze preference and barely cross to midline. Right nasolabial fold flattening. RUE flaccid, 0/5 on pain stimulatin, RLE flaccid but increased muscle tone, trace withdraw on pain stimulatin. LUE and LLE spontaneous movement. Right babinski positive.  ASSESSMENT/PLAN [name] is a 74 y.o. male  with history of diabetes mellitus, hypertension, copd, aortic stenosis, atrial fibrillation (Xarelto in the past DC'd secondary to hematuria), visual impairment, pulmonary nodule, previous stroke in 1997, asthma, and elective CABG + AVR + Maze, performed on 10/16/14.  presenting with altered mental status and right hemiplegia. He did not receive IV t-PA due to recent surgery.  Stroke:  Left MCA large infarct - embolic from known Afib not on [facility] or cardiac procedure  related.  Resultant  Aphasia, right neglect and right hemiplegia.  MRI  not needed as it will not change management  MRA  not needed as it will not change management  CUS - 40-59% RICA stensosis. 60-79% LICA stenosis. Right vertebral antegrade. Left vertebral flow not found.  Recommend CTA neck once Cre normalizes  TEE intraoperative EF 40 -45%. No PFO or cardiac clot.  LDL 96  HgbA1c 8.9  SCDs for VTE prophylaxis Diet NPO time specified  aspirin 81 mg orally every day prior to admission, now on aspirin 325 mg orally every day. Due to afib, pt should be on [facility] for stroke prevention. Currently pt has large MCA infarct, to avoid hemorrhagic transformation, will recommend start AC at 10 days post stroke if not contraindicated. Due to valvular afib, coumadin may be the choice for anticoagulation. Recommend start coumadin at day 7, no need bridge and with expection to reach INR 2-3 at day 10 post stroke.   Ongoing aggressive stroke risk factor management  Therapy recommendations:  Pending  Disposition:  Pending  Cerebral edema  Mass effect on CT with ventrical compression  No mid line shift on last CT  Repeat CT in am to evaluate cerebral edema  Has central line placed  May consider 3% saline if needed  Chronic afib  Not on [facility] but on ASA at home  Has home amiodarone  Last INR check 10/16/14 - 1.89 - will repeat in am  Due to high CHA2DS2-VASc, recommend anticoagulation at 7-10 days after stroke to avoid  hemorrhagic transformation as the left MCA stroke is large. Recommend coumadin at day 7 without bridging and with expection to reach INR 2-3 at day 10 post stroke.  Cardiology on board  S/p MAZE procedure         Condition Points   C CHF (or Left ventricular systolic dysfunction) 1   H HTN: BP consistently above 140/90 mmHg (or treated HTN on meds)  1   A2 Age ?75 years 2   D DM 1   S2 Prior Stroke or TIA or thromboembolism 2   V Vascular disease (e.g. PAD, MI, aortic plaque) 1   A Age 73-74 years 0   Terry male sex  0           Annual Stroke Risk CHA2DS2-VASc Score Stroke Risk % 95% CI  0 0  1 1.3  2 2.2  3 3.2  4 4.0  5 6.7  6 9.8  7 9.6  8 12.5  9 15.2      CAD s/p CABG and AVR  10/16/14 done  Due to AVR, pt likely to have coumadin for anticoagulation  CTS on board  Hypertension  Home meds: Atenolol, hydrochlorothiazide, and lisinopril  stable on Dopamine  Avoid hypotension to decrease cerebral hypoperfusion  Hyperlipidemia  Home meds: No lipid lowering medications prior to admission.  LDL 96, goal < 70  Currently AST 136 and ALT 23  May consider statin once AST normalizes  Diabetes  HgbA1c 8.9, goal < 7.0  Uncontrolled  On levemir  SSI  Management as per primary team  Other Stroke Risk Factors  Advanced age  Cigarette smoker, quit smoking in 1992   ETOH use  Obesity, Body mass index is 34.86 kg/(m^2).   Other Active Problems  Hypokalemia  Postop Anemia  Leukocytosis  Renal insufficiency  Elevated INR  Other Pertinent History    Hospital day # 6  This patient is critically ill due to large  MCA infarct, coagulopathy,  , s/p AVR + CABG + MAZE procedure, afib not on AC and at significant risk of neurological worsening, death form hemorrhagic transformation, recurrent stroke, heart failure, kidney failure, seizure, and respiratory failure . I had a  20 minute long discussion with the patient's nephew and friend regarding his  neurological deficits, prognosis and answered questions. The nephew clearly states patient's desire of not wanting to live a life of disability and in a nursing home which unfortunately is unavoidable. He wants to meet with palliative care and decided on comfort care. Discussed with Dr. Zigmund Daniel, MD Stroke Neurology 10/22/2014 12:54 PM   To contact Stroke Continuity provider, please refer to [email or web address]. After hours, contact General Neurology

## 2014-10-22 NOTE — Consult Note (Signed)
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 She underwent elective CABG+AVR+Maze on 8/17. Following surgery he did not regain his presurgical mental status and was noted to have right sided hemiparesis. Subsequent imaging demonstrated a large incomplete left hemispheric stroke.  The palliative medicine team was consult it to confirm goals of care and to assist with possible transition to comfort care.  I met with patient's nephe  w [name] and his lifelong friend [name] who share HCPOA and their spouses to discuss goals of care. I also provided gentle but straightforward information to Charles [name] directly at the bedside.  Charles [name] had explicitly outlined his wishes to his legal surrogates regarding the type of care he would want to receive if he had a serious or debilitating illness or injury. He was known as a "talker", he was a Emergency planning/management officer and loved to play guitar- he was always known to be "fun and social" and also loved to "eat". He was also fiercely independent and had told his family and close friends that if he were unable to care for himself that he would not want his life prolonged. He had additionally told them that he would never want to be in a nursing home and he would not want any type of artificial feeding  tube. This left hemispheric stroke will unfortunately take away the life activities that he had the most joy in- talking, eating and being social.  Code Status/Advance Care Planning:  DNR, allow for natural death to occur  Symptom Management:   Palliative EOL order set  PRN Morphine for pain and dyspnea  Palliative Prophylaxis: Bowel regimen, Nausea  Given size of stroke I am concerned about seizure activity especially as he becomes hypoxic- I will give him a small dose of diazepam this evening for prophylaxis  Additional Recommendations (Limitations, Scope, Preferences):  We have been fortunate enough to have had detailed and significant pre-surgical conversations with this gentleman regarding his preferences for QOL in the face of his baseline serious illness and in the event of a serious complication as he has had following surgery.  I appreciate the CVTS team for having these discussions and for consulting palliative care based on this gentleman's stated preferences.  Any additional medical interventions geared towards "prolonging his life" would not be in line with his expressed goals of care and prolong his inevitable death- this gentleman was given every best chance to get better with surgery, but has suffered unexpected but serious complications from his underlying chronic diseases following major CVTS surgery done to help his already failing QOL.  Discontinued feeding tube per family request- this will not prevent aspiration, he will not eat again and PEG would never be an option.  Discontinued O2, not contributing to comfort, allow for natural death to occur is most compassionate approach and use opiates PRN for signs of distress or syspnea  Oral care and comfort feeding only.  Psycho-social/Spiritual:   Support System: Family in [place] and [place]  Desire for further Chaplaincy support:yes  Prognosis: < 2 weeks  Discharge Planning:  Hospice facility   Family very  concerned about being out of town and wanting to make sure that he gets the very best EOL care and has dignity. I recommend  a [place] for his EOL management- he is very fragile and exhausted his energy today with his family- I witnessed a dramaic decline in his alertness once they left. Patient endorsed understanding by nodding when I spoke with him about comfort care and his stroke. Family expressed gratitude for the CVTS team and open discussion about goals of care and prognosis. They reported that they all felt "at peace". They desire to be updated and alerted for changes in his condition.       Chief Complaint/History of Present Illness: Stroke  Primary Diagnoses  Present on Admission:  . Atrial fibrillation . Chronic combined systolic and diastolic congestive heart failure . COPD   . Coronary atherosclerosis . Hypertension . Mitral regurgitation . Obesity, Class III, BMI 40-49.9 (morbid obesity) . OSA on CPAP . Physical deconditioning . 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 The following aspects are pertinent.  Past Medical History  Diagnosis Date  . Morbid obesity   . Diverticulosis of colon   . Acute pancreatitis     Gallstone pancreatitis  . COPD (chronic obstructive pulmonary disease)     Dr Lamonte Sakai  . OSA (obstructive sleep apnea)     Dr. Lamonte Sakai  . CAD (coronary artery disease)   . Cataract   . Nonexudative senile macular degeneration of retina   . Solitary pulmonary nodule     Dr. Lamonte Sakai    . Hyperlipidemia   . Osteoarthritis   . Anemia   . Diabetes mellitus, type 2   . Hypertension   . Aortic stenosis   . Atrial fibrillation     New diagnosis August 11, 2011  . DOE (dyspnea on exertion)   . Edema   . Chronic combined systolic and diastolic congestive heart failure     . Visual impairment     severe  . Physical deconditioning   . Limited mobility   . Incidental pulmonary nodule, greater than or equal to 72m 08/12/2014    Right lung - [place] seen [place]  . Dysrhythmia     atrial fib  . Heart murmur   . Stroke 1997    numbness in left hand  . Asthma     child  . S/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  10/16/2014    [facility] L[facility] bovine bioprosthetic tissue valve  . S/P CABG x 4 10/16/2014    LIMA to LAD, SVG to D2, SVG to OM, SVG to PDA, EVH via bilateral thighs  . S/P Maze operation for atrial fibrillation 10/16/2014    Complete bilateral atrial lesion set using cryothermy and bipolar radiofrequency ablation with clipping of LA appendage  . Cerebral embolism with cerebral infarction 10/18/2014   Social History   Social History  . Marital Status: Single    Spouse Name: N/A  . Number of Children: N/A  . Years of Education: N/A   Occupational History  . retired     sCurator  professional musician for 12 years.    Social History Main Topics  . Smoking status: Former Smoker -- 3.00 packs/day for 35 years    Types: Cigarettes    Quit date: 03/01/1990  . Smokeless tobacco: Never Used  . Alcohol Use: Yes     Comment: occ rare  . Drug Use: No  . Sexual Activity: Not Currently   Other Topics Concern  . None   Social History Narrative   Publishing rights manager   Work- retired. 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    Family History  Problem Relation Age of Onset  . Cancer Father     metastatic  . Heart failure Father   . Heart disease Mother   . Heart attack Mother   . Hypertension Mother   . Diabetes Mother    Scheduled Meds: . antiseptic oral rinse  7 mL Mouth Rinse q12n4p  . aspirin EC  325 mg Oral Daily   Or  . aspirin  324 mg Per Tube Daily  . bisacodyl  10 mg Rectal Daily   . chlorhexidine  15 mL Mouth Rinse BID  . [START ON 10/23/2014] furosemide  40 mg Intravenous Q12H  . insulin aspart  0-24 Units Subcutaneous 6 times per day  . insulin detemir  20 Units Subcutaneous BID  . ipratropium-albuterol  3 mL Nebulization Q6H  . metoprolol tartrate  50 mg Per Tube BID  . pantoprazole sodium  40 mg Per Tube Daily  . potassium chloride      . sodium chloride  10-40 mL Intracatheter Q12H  . sodium chloride  3 mL Intravenous Q12H   Continuous Infusions: . feeding supplement (VITAL AF 1.2 CAL)     PRN Meds:.iohexol, metoprolol, morphine injection, ondansetron (ZOFRAN) IV, sodium chloride, sodium chloride Medications Prior to Admission:  Prior to Admission medications   Medication Sig Start Date End Date Taking? Authorizing Provider  Aflibercept 2 MG/0.05ML SOLN Inject into the eye as directed. Injection into right eye every 4 weeks   Yes Historical Provider, MD  amiodarone (PACERONE) 200 MG tablet Take 1 tablet (200 mg total) by mouth 2 (two) times daily. 09/16/14  Yes Rexene Alberts, MD  aspirin EC 81 MG tablet Take 1 tablet (81 mg total) by mouth daily. 07/01/14  Yes Janith Lima, MD  atenolol (TENORMIN) 50 MG tablet Take 25 mg by mouth daily. Taking one half tablet (25 mg) by mouth daily   Yes Historical Provider, MD  hydrochlorothiazide (HYDRODIURIL) 25 MG tablet Take 25 mg by mouth daily.   Yes Historical Provider, MD  Insulin Isophane & Regular Human (HUMULIN 70/30 KWIKPEN) (70-30) 100 UNIT/ML PEN Inject 30 Units into the skin 2 (two) times daily with a meal. 03/11/14  Yes Janith Lima, MD  lisinopril (PRINIVIL,ZESTRIL) 20 MG tablet Take 20 mg by mouth daily.   Yes Historical Provider, MD  metFORMIN (GLUCOPHAGE) 1000 MG tablet Take 1,000 mg by mouth 2 (two) times daily with a meal.   Yes Historical Provider, MD  minocycline (MINOCIN,DYNACIN) 100 MG capsule Take 100 mg by mouth 3 (three) times a week.  05/31/14  Yes Historical Provider, MD  nitroGLYCERIN  (NITROSTAT) 0.4 MG SL tablet Place 1 tablet (0.4 mg total) under the tongue every 5 (five) minutes as needed for chest pain. 07/01/14  Yes Janith Lima, MD  albuterol (PROVENTIL HFA;VENTOLIN HFA) 108 (90 BASE) MCG/ACT inhaler Inhale 2 puffs into the lungs every 6 (six)  hours as needed for wheezing or shortness of breath.    Historical Provider, MD  B-D INS SYRINGE 0.5CC/30GX1/2" 30G X 1/2" 0.5 ML MISC USE AS DIRECTED. 02/04/12   Neena Rhymes, MD  Blood Glucose Monitoring Suppl [facility] BLOOD GLUCOSE) W/DEVICE KIT Test up to TID Dx: E11.39 12/13/13   Janith Lima, MD  glucose blood (TRUETRACK TEST) test strip Test up to TID Dx:E11.39 12/13/13   Janith Lima, MD  Respiratory Therapy Supplies (FLUTTER) DEVI Use as directed 06/21/13   Collene Gobble, MD   No Known Allergies CBC:    Component Value Date/Time   WBC 11.7* 10/22/2014 0506   HGB 9.8* 10/22/2014 0506   HCT 30.1* 10/22/2014 0506   PLT 208 10/22/2014 0506   MCV 92.0 10/22/2014 0506   NEUTROABS 5.5 07/10/2014 1615   LYMPHSABS 1.2 07/10/2014 1615   MONOABS 0.4 07/10/2014 1615   EOSABS 0.2 07/10/2014 1615   BASOSABS 0.0 07/10/2014 1615   Comprehensive Metabolic Panel:    Component Value Date/Time   NA 140 10/22/2014 0506   K 3.4* 10/22/2014 0506   CL 96* 10/22/2014 0506   CO2 33* 10/22/2014 0506   BUN 33* 10/22/2014 0506   CREATININE 1.39* 10/22/2014 0506   GLUCOSE 201* 10/22/2014 0506   CALCIUM 7.5* 10/22/2014 0506   AST 136* 10/18/2014 0500   ALT 23 10/18/2014 0500   ALKPHOS 39 10/18/2014 0500   BILITOT 1.6* 10/18/2014 0500   PROT 4.7* 10/18/2014 0500   ALBUMIN 2.9* 10/18/2014 0500    Physical Exam: Vital Signs: BP 112/51 mmHg  Pulse 77  Temp(Src) 99 F (37.2 C) (Axillary)  Resp 21  Wt 123.2 kg (271 lb 9.7 oz)  SpO2 98% SpO2: SpO2: 98 % O2 Device: O2 Device: Nasal Cannula O2 Flow Rate: O2 Flow Rate (L/min): 2 L/min Intake/output summary:  Intake/Output Summary (Last 24 hours) at 10/22/14 1800 Last  data filed at 10/22/14 1700  Gross per 24 hour  Intake 844.67 ml  Output   3175 ml  Net -2330.33 ml   LBM:   Baseline Weight: Weight: 127.007 kg (280 lb) Most recent weight: Weight: 123.2 kg (271 lb 9.7 oz)  Exam Findings:  Critically ill appearing, very dry mouth Able to open his eyes and attempts to communicate, cannot speak or swallow, has dense right hemiparesis.         Palliative Performance Scale: 10           Additional Data Reviewed: Recent Labs     10/21/14  0419  10/21/14  2109  10/22/14  0506  WBC  13.2*   --   11.7*  HGB  9.5*   --   9.8*  PLT  152   --   208  NA  140  141  140  BUN  27*  32*  33*  CREATININE  1.37*  1.46*  1.39*     Time : 430PM-6PM Time : 90 minutes  Greater than 50%  of this time was spent counseling and coordinating care related to the above assessment and plan.  Signed by: Roma Schanz, DO  10/22/2014, 6:00 PM  Please contact Palliative Medicine Team phone at [phone] for questions and concerns.

## 2014-10-22 NOTE — Progress Notes (Addendum)
      [address]       [name] [address]             [phone]        CARDIOTHORACIC SURGERY PROGRESS NOTE   R6 Days Post-Op Procedure(s) (LRB): AORTIC VALVE REPLACEMENT (AVR)/CORONARY ARTERY BYPASS GRAFTING (CABG) x4 using left internal mammary artery and bilateral greater saphenous veins harvested laparoscopically (N/A) MAZE (N/A) TRANSESOPHAGEAL ECHOCARDIOGRAM (TEE) (N/A) CLIPPING OF ATRIAL APPENDAGE (N/A)  Subjective: More alert.  Follows some commands with left arm and leg.  Not speaking and not moving right side.  Objective: Vital signs: BP Readings from Last 1 Encounters:  10/22/14 119/52   Pulse Readings from Last 1 Encounters:  10/22/14 77   Resp Readings from Last 1 Encounters:  10/22/14 21   Temp Readings from Last 1 Encounters:  10/22/14 97.2 F (36.2 C) Axillary    Hemodynamics:    Physical Exam:  Rhythm:   sinus  Breath sounds: Coarse rhonchi  Heart sounds:  RRR  Incisions:  Clean and dry  Abdomen:  Soft, non-distended  Extremities:  Warm, well-perfused    Intake/Output from previous day: 08/22 0701 - 08/23 0700 In: 895.6 [I.V.:226.9; NG/GT:318.7; IV Piggyback:350] Out: 4645 [Urine:4645] Intake/Output this shift: Total I/O In: 108 [I.V.:8; NG/GT:50; IV Piggyback:50] Out: 180 [Urine:180]  Lab Results:  CBC: Recent Labs  10/21/14 0419 10/22/14 0506  WBC 13.2* 11.7*  HGB 9.5* 9.8*  HCT 29.3* 30.1*  PLT 152 208    BMET:  Recent Labs  10/21/14 2109 10/22/14 0506  NA 141 140  K 3.7 3.4*  CL 97* 96*  CO2 33* 33*  GLUCOSE 181* 201*  BUN 32* 33*  CREATININE 1.46* 1.39*  CALCIUM 7.4* 7.5*     PT/INR:   Recent Labs  10/20/14 0557  LABPROT 15.2  INR 1.18    CBG (last 3)   Recent Labs  10/21/14 2053 10/22/14 0030 10/22/14 0436  GLUCAP 181* 178* 187*    ABG    Component Value Date/Time   PHART 7.515* 10/21/2014 2124   PCO2ART 41.3 10/21/2014 2124   PO2ART 68.0* 10/21/2014 2124   HCO3  33.3* 10/21/2014 2124   TCO2 35 10/21/2014 2124   ACIDBASEDEF 7.0* 10/17/2014 0313   O2SAT 95.0 10/21/2014 2124    CXR: n/a  Assessment/Plan: S/P Procedure(s) (LRB): AORTIC VALVE REPLACEMENT (AVR)/CORONARY ARTERY BYPASS GRAFTING (CABG) x4 using left internal mammary artery and bilateral greater saphenous veins harvested laparoscopically (N/A) MAZE (N/A) TRANSESOPHAGEAL ECHOCARDIOGRAM (TEE) (N/A) CLIPPING OF ATRIAL APPENDAGE (N/A)  Overall stable now POD6 Maintaining NSR w/ stable BP Breathing comfortably and O2 sats 96% on 2 L/min via [place] Neuro status slightly improved but dense right hemiparesis and aphasia persists Expected post op acute blood loss anemia, stable Expected post op volume excess, diuresing well on lasix drip Expected post op atelectasis, mild Type II diabetes mellitus, CBG's trending up since Levemir stopped Hypokalemia - diuretic induced Sacral decubitus wound   Continue tube feeds  Restart Levemir insulin  Supplement potassium  Continue lasix drip today  PT/OT as much as possible  ASA only for anticoagulation until okay w/ Neurology to start warfarin (tentative plan for day #7)  Plan as outlined by wound care team for sacral wound  [name] 10/22/2014 8:48 AM

## 2014-10-22 NOTE — Progress Notes (Addendum)
Nutrition Consult/Follow Up  DOCUMENTATION CODES:   Obesity unspecified  INTERVENTION:    Once Panda tube placed & verified, initiate Vital AF 1.2 formula at 20 ml/hr and increase by 10 ml every 4 hours to goal rate of 80 ml/hr  TF regimen to provide 2304 kcals, 144 gm protein, 1557 ml of free water  NUTRITION DIAGNOSIS:   Inadequate oral intake related to inability to eat as evidenced by NPO status, ongoing  GOAL:   Patient will meet greater than or equal to 90% of their needs, currently unmet  MONITOR:   TF tolerance, Weight trends, Labs, I & O's  ASSESSMENT:   74 yo Male with hx COPD, restrictive lung disease, OSA/ OHS, chronic A fib, CAD with stable angina, Aortic Stenosis. He was admitted for elective CABG + AVR + Maze, performed on 10/16/14. He returned to the ICU intubated and sedated.    Patient s/p procedures 8/17: AORTIC VALVE REPLACEMENT CORONARY ARTERY BYPASS GRAFTING x 4 MAZE PROCEDURE  Pt suffered left large MCA infarct with midline shift post-op.  Pt extubated 8/21, however, remains unresponsive.  CWOCN note 8/22 reviewed.  Pt with sDTI (suspeced deep tissue injury) to sacrum.  Plan is for post pyloric feeding tube placement.  RD consulted for TF initiation & management.  Diet Order:  Diet NPO time specified  Skin:  Wound (see comment) (sDTI to sacrum)  Last BM:  N/A  Height:   Ht Readings from Last 1 Encounters:  10/14/14  (1.88 m)    Weight:   Wt Readings from Last 1 Encounters:  10/22/14 271 lb 9.7 oz (123.2 kg)    Ideal Body Weight:  86.3 kg  BMI:  Body mass index is 34.86 kg/(m^2).  Re-estimated Nutritional Needs:   Kcal:  2200-2400  Protein:  140-150 gm  Fluid:  per MD  EDUCATION NEEDS:   No education needs identified at this time  Maureen Chatters, RD, LDN Pager #: [phone] After-Hours Pager #: [phone]

## 2014-10-22 NOTE — Evaluation (Addendum)
Physical Therapy Evaluation Patient Details Name: Charles [name] MRN: [id number] DOB: [birth year] Today's Date: 10/22/2014   History of Present Illness  Patient is a 74 y/o male with history of aortic stenosis, coronary artery disease, chronic combined systolic and diastolic congestive heart failure, chronic persistent atrial fibrillation, previous stroke, COPD, and obstructive sleep apnea.  He is s/p CABG x 4, AVR and MAZE procedure complicated postoperative by large L MCA CVA.  Clinical Impression  Patient presents with decreased independence with mobility due to deficits listed below and will benefit from skilled PT in the acute setting prior to d/c to inpatient rehab setting.  Feel CIR level therapies indicated to decrease burden of care prior to probable d/c to longer term rehab setting.    Follow Up Recommendations CIR    Equipment Recommendations  Other (comment) (TBA)    Recommendations for Other Services Rehab consult;OT consult;Speech consult     Precautions / Restrictions Precautions Precautions: Fall      Mobility  Bed Mobility Overal bed mobility: +2 for physical assistance;Needs Assistance Bed Mobility: Rolling;Sidelying to Sit;Sit to Supine Rolling: Max assist Sidelying to sit: +2 for physical assistance;Total assist   Sit to supine: +2 for physical assistance;Total assist   General bed mobility comments: flexed pt's leg and assisted to roll; most alert when attempting to roll, then side to sit assist for trunk and legs, pt not attempting to help  Transfers                 General transfer comment: NT due to patient not following commands and falling right with poor head control in sitting  Ambulation/Gait                Stairs            Wheelchair Mobility    Modified Rankin (Stroke Patients Only) Modified Rankin (Stroke Patients Only) Pre-Morbid Rankin Score: Slight disability Modified Rankin: Severe disability     Balance  Overall balance assessment: Needs assistance Sitting-balance support: Feet supported Sitting balance-Leahy Scale: Zero Sitting balance - Comments: sat edge of bed about 5 minutes attempting to increased level of arousal and participation in head control activities.  Did hold bed rail when left hand placed there, but when removed did not reach back out.   Postural control: Posterior lean;Right lateral lean                                   Pertinent Vitals/Pain Pain Assessment: Faces Faces Pain Scale: No hurt    Home Living Family/patient expects to be discharged to:: Inpatient rehab Living Arrangements: Alone                    Prior Function Level of Independence: Independent with assistive device(s)         Comments: according to chart patient lived alone used a cane due to instability and stopped driving due to poor eyesight.     Hand Dominance        Extremity/Trunk Assessment   Upper Extremity Assessment: RUE deficits/detail;LUE deficits/detail;Difficult to assess due to impaired cognition RUE Deficits / Details: no active movement and decreased muscle tone, edema in hand; PROM WFL     LUE Deficits / Details: moves spontaneiously, but not to command today, mitt on hand initially, seems limited with AAROM shoulder flexion > 110   Lower Extremity Assessment: RLE deficits/detail;LLE deficits/detail;Difficult to assess due to impaired  cognition RLE Deficits / Details: held in ER and flexion, no active movement noted, stiff with PROM, but no specific muscle or joint restrictions noted       Communication   Communication: Other (comment) (limited by level of arousal)  Cognition Arousal/Alertness: Lethargic Behavior During Therapy: Flat affect Overall Cognitive Status: Impaired/Different from baseline Area of Impairment: Following commands       Following Commands: Follows one step commands inconsistently       General Comments: not  following commands during PT session, RN reports was following some earlier    General Comments General comments (skin integrity, edema, etc.): eyes do not track past midline to right    Exercises Other Exercises Other Exercises: PROM right UE and LE      Assessment/Plan    PT Assessment Patient needs continued PT services  PT Diagnosis Hemiplegia dominant side;Altered mental status   PT Problem List Decreased strength;Impaired sensation;Impaired tone;Decreased balance;Decreased mobility;Decreased safety awareness;Decreased knowledge of use of DME;Decreased activity tolerance;Decreased cognition  PT Treatment Interventions DME instruction;Balance training;Neuromuscular re-education;Cognitive remediation;Functional mobility training;Therapeutic activities;Therapeutic exercise;Patient/family education;Wheelchair mobility training   PT Goals (Current goals can be found in the Care Plan section) Acute Rehab PT Goals Patient Stated Goal: None stated PT Goal Formulation: Patient unable to participate in goal setting Time For Goal Achievement: 11/05/14 Potential to Achieve Goals: Fair    Frequency Min 3X/week   Barriers to discharge Decreased caregiver support      Co-evaluation               End of Session   Activity Tolerance: Patient limited by lethargy Patient left: in bed;with nursing/sitter in room           Time: 1002-1032 PT Time Calculation (min) (ACUTE ONLY): 30 min   Charges:   PT Evaluation $Initial PT Evaluation Tier I: 1 Procedure PT Treatments $Therapeutic Activity: 8-22 mins   PT G Codes:        Rexine Gowens,CYNDI 11/09/2014, 12:54 PM  Sheran Lawless, PT [phone] 11/09/14 .

## 2014-10-22 NOTE — Progress Notes (Signed)
[facility] BRIEF SICU PROGRESS NOTE  6 Days Post-Op  S/P Procedure(s) (LRB): AORTIC VALVE REPLACEMENT (AVR)/CORONARY ARTERY BYPASS GRAFTING (CABG) x4 using left internal mammary artery and bilateral greater saphenous veins harvested laparoscopically (N/A) MAZE (N/A) TRANSESOPHAGEAL ECHOCARDIOGRAM (TEE) (N/A) CLIPPING OF ATRIAL APPENDAGE (N/A)   I had a long conversation with the patient's nephew and close friends after they spoke with Dr. Pearlean Brownie.  We discussed the severity of his stroke, his overall poor prognosis for long term functional recovery, and his advanced directive regarding long term care that was clearly stated preoperatively.    Plan: We will not escalate his care in any manner and we plan to consult the Palliative Care Team to assist with his long term management.  We will keep his feeding tube in place for now and continue other medical therapy as currently ordered, including physical therapy and rehab.  All of their questions have been addressed.  Charles [name] 10/22/2014 4:22 PM

## 2014-10-23 LAB — GLUCOSE, CAPILLARY: Glucose-Capillary: 201 mg/dL — ABNORMAL HIGH (ref 65–99)

## 2014-10-23 MED ORDER — IPRATROPIUM-ALBUTEROL 0.5-2.5 (3) MG/3ML IN SOLN: 3.0000 mL | Freq: Four times a day (QID) | RESPIRATORY_TRACT | Status: DC | PRN

## 2014-10-23 NOTE — Progress Notes (Signed)
      [address]       [place] [address]             [phone]        CARDIOTHORACIC SURGERY PROGRESS NOTE   R7 Days Post-Op Procedure(s) (LRB): AORTIC VALVE REPLACEMENT (AVR)/CORONARY ARTERY BYPASS GRAFTING (CABG) x4 using left internal mammary artery and bilateral greater saphenous veins harvested laparoscopically (N/A) MAZE (N/A) TRANSESOPHAGEAL ECHOCARDIOGRAM (TEE) (N/A) CLIPPING OF ATRIAL APPENDAGE (N/A)  Subjective: Unresponsive.  Looks comfortable.  Objective: Vital signs: BP Readings from Last 1 Encounters:  10/23/14 139/60   Pulse Readings from Last 1 Encounters:  10/22/14 77   Resp Readings from Last 1 Encounters:  10/23/14 20   Temp Readings from Last 1 Encounters:  10/23/14 98.3 F (36.8 C) Oral    Hemodynamics:    Physical Exam:  Rhythm:   sinus  Breath sounds: Coarse rhonchi  Heart sounds:  RRR  Incisions:  Clean and dry  Abdomen:  Soft, non-distended  Extremities:  Warm, well-perfused    Intake/Output from previous day: 08/23 0701 - 08/24 0700 In: 550 [I.V.:220; NG/GT:230; IV Piggyback:100] Out: 2940 [Urine:2940] Intake/Output this shift:    Lab Results:  CBC: Recent Labs  10/21/14 0419 10/22/14 0506  WBC 13.2* 11.7*  HGB 9.5* 9.8*  HCT 29.3* 30.1*  PLT 152 208    BMET:  Recent Labs  10/21/14 2109 10/22/14 0506  NA 141 140  K 3.7 3.4*  CL 97* 96*  CO2 33* 33*  GLUCOSE 181* 201*  BUN 32* 33*  CREATININE 1.46* 1.39*  CALCIUM 7.4* 7.5*     PT/INR:  No results for input(s): LABPROT, INR in the last 72 hours.  CBG (last 3)   Recent Labs  10/22/14 1923 10/22/14 2345 10/23/14 0353  GLUCAP 186* 187* 201*    ABG    Component Value Date/Time   PHART 7.515* 10/21/2014 2124   PCO2ART 41.3 10/21/2014 2124   PO2ART 68.0* 10/21/2014 2124   HCO3 33.3* 10/21/2014 2124   TCO2 35 10/21/2014 2124   ACIDBASEDEF 7.0* 10/17/2014 0313   O2SAT 95.0 10/21/2014 2124    CXR: n/a  Assessment/Plan: S/P  Procedure(s) (LRB): AORTIC VALVE REPLACEMENT (AVR)/CORONARY ARTERY BYPASS GRAFTING (CABG) x4 using left internal mammary artery and bilateral greater saphenous veins harvested laparoscopically (N/A) MAZE (N/A) TRANSESOPHAGEAL ECHOCARDIOGRAM (TEE) (N/A) CLIPPING OF ATRIAL APPENDAGE (N/A)  Massive embolic stroke in left MCA territory w/ essentially no hope for any reasonable recovery Plans for palliative care only per Dr Phillips Odor and colleagues Comfort measures only Await disposition   [name] 10/23/2014 10:03 AM

## 2014-10-23 NOTE — Care Management Important Message (Signed)
Important Message  Patient Details  Name: Layton Naves MRN: [id number] Date of Birth: 18-Oct-1940   Medicare Important Message Given:  [facility] notification given    Kyla Balzarine 10/23/2014, 11:43 AMImportant Message  Patient Details  Name: Clemon Devaul MRN: [id number] Date of Birth: [birth year]   Medicare Important Message Given:  Yes-second notification given    Kyla Balzarine 10/23/2014, 11:43 AM

## 2014-10-23 NOTE — Progress Notes (Signed)
STROKE TEAM PROGRESS NOTE   HISTORY Charles [name] is a 74 y.o. male with hx COPD, restrictive lung disease, OSA/ OHS, chronic A fib, CAD with stable angina, Aortic Stenosis. He was admitted for elective CABG + AVR + Maze, performed on 10/16/14. He returned to the ICU intubated and sedated. Sedation discontinued approximately 24 hours ago and patient has not regained presurgical mental status. There has been some spontaneous movement on the left but no movement noted on the right. He is not following commands. Consult called for further recommendations.   Date last known well: Date: 10/16/2014 Time last known well: Time: 07:48 tPA Given: No: Outside time window, s/p major surgery   SUBJECTIVE (INTERVAL HISTORY) His RN is at the bedside.  Nephew has decided on comfort care. Plan to transfer patient to [facility] later today   OBJECTIVE Temp:  [98.3 F (36.8 C)-99 F (37.2 C)] 98.3 F (36.8 C) (08/24 0400) Pulse Rate:  [75-79] 77 (08/23 2000) Cardiac Rhythm:  [-] Normal sinus rhythm (08/24 0600) Resp:  [15-21] 20 (08/24 1030) BP: (113-140)/(52-60) 136/59 mmHg (08/24 1030) SpO2:  [83 %-98 %] 88 % (08/24 1030)   Recent Labs Lab 10/22/14 1218 10/22/14 1537 10/22/14 1923 10/22/14 2345 10/23/14 0353  GLUCAP 188* 199* 186* 187* 201*    Recent Labs Lab 10/17/14 0317 10/17/14 1200  10/17/14 1711  10/19/14 0518  10/20/14 0557 10/20/14 1607 10/21/14 0419 10/21/14 2109 10/22/14 0506  NA 141 141  < >  --   < > 139  < > 140 138 140 141 140  K 2.7* 3.3*  < >  --   < > 3.1*  < > 2.7* 3.1* 3.2* 3.7 3.4*  CL 108 107  < >  --   < > 101  < > 101 100* 97* 97* 96*  CO2 20* 25  --   --   < > 26  --  28  --  31 33* 33*  GLUCOSE 253* 114*  < >  --   < > 105*  < > 74 90 93 181* 201*  BUN 22* 21*  < >  --   < > 24*  < > 27* 27* 27* 32* 33*  CREATININE 1.69* 1.53*  < > 1.63*  < > 1.51*  < > 1.45* 1.50* 1.37* 1.46* 1.39*  CALCIUM 7.8* 8.2*  --   --   < > 7.7*  --  7.5*  --  7.4*  7.4* 7.5*  MG 2.8* 2.6*  --  2.5*  --   --   --   --   --   --   --   --   < > = values in this interval not displayed.  Recent Labs Lab 10/18/14 0500  AST 136*  ALT 23  ALKPHOS 39  BILITOT 1.6*  PROT 4.7*  ALBUMIN 2.9*    Recent Labs Lab 10/18/14 0500 10/19/14 0518 10/19/14 1807 10/20/14 0557 10/20/14 1607 10/21/14 0419 10/22/14 0506  WBC 15.6* 13.8*  --  11.8*  --  13.2* 11.7*  HGB 9.0* 9.0* 8.8* 8.4* 9.5* 9.5* 9.8*  HCT 25.7* 26.6* 26.0* 25.0* 28.0* 29.3* 30.1*  MCV 85.7 88.4  --  89.0  --  91.3 92.0  PLT 101* 93*  --  116*  --  152 208   No results for input(s): CKTOTAL, CKMB, CKMBINDEX, TROPONINI in the last 168 hours. No results for input(s): LABPROT, INR in the last 72 hours. No results for input(s): COLORURINE, LABSPEC,  PHURINE, GLUCOSEU, HGBUR, BILIRUBINUR, KETONESUR, PROTEINUR, UROBILINOGEN, NITRITE, LEUKOCYTESUR in the last 72 hours.  Invalid input(s): APPERANCEUR     Component Value Date/Time   CHOL 136 06/05/2014 1211   TRIG 80.0 06/05/2014 1211   HDL 24.20* 06/05/2014 1211   CHOLHDL 6 06/05/2014 1211   VLDL 16.0 06/05/2014 1211   LDLCALC 96 06/05/2014 1211   Lab Results  Component Value Date   HGBA1C 8.9* 10/14/2014   No results found for: LABOPIA, COCAINSCRNUR, LABBENZ, AMPHETMU, THCU, LABBARB  No results for input(s): ETH in the last 168 hours.   IMAGING  Dg Chest 1 View 10/18/2014    1. Endotracheal tube has been advanced. Its tip is 2.3 cm above the carina. Remaining lines and tubes in stable position.  2. Prior CABG and aortic valve replacement. Persistent cardiomegaly. Increased interstitial markings suggesting mild congestive heart failure.  3. Low lung volumes with bibasilar atelectasis.     Ct Head Wo Contrast 10/18/2014    Acute infarct involving essentially the entire left middle cerebral artery distribution with partial effacement of the left lateral ventricle. There is increased attenuation in the left middle cerebral artery  consistent with thrombosis of this vessel. No midline shift. No hemorrhage. There is mild underlying atrophy with mild periventricular small vessel disease.    TEE Intraoperative   10/16/2014 Impressions: - LV systolic function baseline to mildly diminished, Right heart with moderate dysfunction, mild MR at baseline, no AI with well seated and function AV, no PFO or new ASD, no signs of dissection, TV with no TR, PAC in main PA, no signs of significant pleural effusion.  CT repeat 10/20/14 No significant change since the study of 2 days ago. Low-density in swelling throughout the left MCA territory consistent with subtotal left MCA territory infarction. Mass effect with left-to-right shift of only 2-3 mm. No hemorrhagic transformation  CUS - preCABG 10/14/14 :1. Right: 40-59% ICA stensosis. Left: 60-79% ICA stenosis. 2. Right vertebral antegrade. Left vertebral flow not found.   PHYSICAL EXAM  Temp:  [98.3 F (36.8 C)-99 F (37.2 C)] 98.3 F (36.8 C) (08/24 0400) Pulse Rate:  [75-79] 77 (08/23 2000) Resp:  [15-21] 20 (08/24 1030) BP: (113-140)/(52-60) 136/59 mmHg (08/24 1030) SpO2:  [83 %-98 %] 88 % (08/24 1030)  General - obese elderly Caucasian male not in distress  Ophthalmologic - fundi not visualized due to noncooperation.  Cardiovascular - Regular rate and rhythm, not in afib.  Neuro - drowsy but easily opens eyes,  Globally aphasic and not following commands. Right visual neglect, blinking to visual threat on the left but not on the right, PERRL, eyes left gaze preference and barely cross to midline. Right nasolabial fold flattening. RUE flaccid, 0/5 on pain stimulatin, RLE flaccid but increased muscle tone, trace withdraw on pain stimulatin. LUE and LLE spontaneous movement. Right babinski positive.  ASSESSMENT/PLAN [name] is a 74 y.o. male with history of diabetes mellitus, hypertension, copd, aortic stenosis, atrial fibrillation (Xarelto in the  past DC'd secondary to hematuria), visual impairment, pulmonary nodule, previous stroke in 1997, asthma, and elective CABG + AVR + Maze, performed on 10/16/14.  presenting with altered mental status and right hemiplegia. He did not receive IV t-PA due to recent surgery.  Stroke:  Left MCA large infarct - embolic from known Afib not on [facility] or cardiac procedure related.  Resultant  Aphasia, right neglect and right hemiplegia.  MRI  not needed as it will not change management  MRA  not needed as  it will not change management  CUS - 40-59% RICA stensosis. 60-79% LICA stenosis. Right vertebral antegrade. Left vertebral flow not found.  Recommend CTA neck once Cre normalizes  TEE intraoperative EF 40 -45%. No PFO or cardiac clot.  LDL 96  HgbA1c 8.9  SCDs for VTE prophylaxis    aspirin 81 mg orally every day prior to admission, now on aspirin 325 mg orally every day. Due to afib, pt should be on [facility] for stroke prevention. Currently pt has large MCA infarct, to avoid hemorrhagic transformation, will recommend start AC at 10 days post stroke if not contraindicated. Due to valvular afib, coumadin may be the choice for anticoagulation. Recommend start coumadin at day 7, no need bridge and with expection to reach INR 2-3 at day 10 post stroke.   Ongoing aggressive stroke risk factor management  Therapy recommendations:  Pending  Disposition:  Pending  Cerebral edema  Mass effect on CT with ventrical compression  No mid line shift on last CT  Repeat CT in am to evaluate cerebral edema  Has central line placed  May consider 3% saline if needed  Chronic afib  Not on [facility] but on ASA at home  Has home amiodarone  Last INR check 10/16/14 - 1.89 - will repeat in am  Due to high CHA2DS2-VASc, recommend anticoagulation at 7-10 days after stroke to avoid hemorrhagic transformation as the left MCA stroke is large. Recommend coumadin at day 7 without bridging and with expection to reach INR 2-3  at day 10 post stroke.  Cardiology on board  S/p MAZE procedure         Condition Points   C CHF (or Left ventricular systolic dysfunction) 1   H HTN: BP consistently above 140/90 mmHg (or treated HTN on meds)  1   A2 Age ?75 years 2   D DM 1   S2 Prior Stroke or TIA or thromboembolism 2   V Vascular disease (e.g. PAD, MI, aortic plaque) 1   A Age 16-74 years 0   [place] male sex  0           Annual Stroke Risk CHA2DS2-VASc Score Stroke Risk % 95% CI  0 0  1 1.3  2 2.2  3 3.2  4 4.0  5 6.7  6 9.8  7 9.6  8 12.5  9 15.2      CAD s/p CABG and AVR  10/16/14 done  Due to AVR, pt likely to have coumadin for anticoagulation  CTS on board  Hypertension  Home meds: Atenolol, hydrochlorothiazide, and lisinopril  stable on Dopamine  Avoid hypotension to decrease cerebral hypoperfusion  Hyperlipidemia  Home meds: No lipid lowering medications prior to admission.  LDL 96, goal < 70  Currently AST 136 and ALT 23  May consider statin once AST normalizes  Diabetes  HgbA1c 8.9, goal < 7.0  Uncontrolled  On levemir  SSI  Management as per primary team  Other Stroke Risk Factors  Advanced age  Cigarette smoker, quit smoking in 1992   ETOH use  Obesity, Body mass index is 34.86 kg/(m^2).   Other Active Problems  Hypokalemia  Postop Anemia  Leukocytosis  Renal insufficiency  Elevated INR  Other Pertinent History    Hospital day # 7  This patient is critically ill due to large MCA infarct, coagulopathy,  , s/p AVR + CABG + MAZE procedure, afib not on AC and at significant risk of neurological worsening, death form hemorrhagic transformation, recurrent stroke,  heart failure, kidney failure, seizure, and respiratory failure . I had a  20 minute long discussion with the patient's nephew and friend regarding his neurological deficits, prognosis and answered questions. The nephew wants  palliative care and has decided on comfort care. Discussed with  Dr. Cornelius Moras.Stroke team will sign off. Call for questions. Delia Heady, MD Stroke Neurology 10/23/2014 1:07 PM   To contact Stroke Continuity provider, please refer to [email or web address]. After hours, contact General Neurology

## 2014-10-23 NOTE — Progress Notes (Signed)
Chaplain responded to consult for palliative care.  Pt not able to talk.  Chaplain reiterated some of what palliative care MD had shared in prior not that we are honoring pt's wishes.  Chaplain offered support to pt at bedside and talked briefly directly to pt.  Chaplain available for follow up with pt and/or family as needed.    10/23/14 0900  Clinical Encounter Type  Visited With Patient  Visit Type Initial;Spiritual support;Social support;Critical Care  Referral From Palliative care team  Spiritual Encounters  Spiritual Needs Emotional  Stress Factors  Patient Stress Factors Health changes  Advance Directives (For Healthcare)  Does patient have an advance directive? Yes   Blain Pais 10/23/2014 9:53 AM

## 2014-10-23 NOTE — Consult Note (Addendum)
[facility] Liaison:  Received request from CSW for patient interest in [facility]. Chart reviewed, spoke with bedside RN, no family or friends present. Will update CSW later this afternoon re availability. Updated CSW and RNCM. Thank you. Forrestine Him, LCSW [phone]

## 2014-10-24 MED ORDER — MORPHINE SULFATE (PF) 2 MG/ML IV SOLN: 2.0000 mg | INTRAVENOUS | Status: AC | PRN

## 2014-10-24 MED ORDER — ONDANSETRON HCL 4 MG/2ML IJ SOLN: 4.0000 mg | Freq: Four times a day (QID) | INTRAMUSCULAR | Status: AC | PRN

## 2014-10-24 MED ORDER — POLYVINYL ALCOHOL 1.4 % OP SOLN: 1.0000 [drp] | Freq: Four times a day (QID) | OPHTHALMIC | Status: AC | PRN

## 2014-10-24 MED ORDER — LORAZEPAM 2 MG/ML IJ SOLN: 1.0000 mg | INTRAMUSCULAR | Status: AC | PRN

## 2014-10-24 NOTE — Progress Notes (Signed)
TCTS BRIEF SICU PROGRESS NOTE  8 Days Post-Op  S/P Procedure(s) (LRB): AORTIC VALVE REPLACEMENT (AVR)/CORONARY ARTERY BYPASS GRAFTING (CABG) x4 using left internal mammary artery and bilateral greater saphenous veins harvested laparoscopically (N/A) MAZE (N/A) TRANSESOPHAGEAL ECHOCARDIOGRAM (TEE) (N/A) CLIPPING OF ATRIAL APPENDAGE (N/A)   Patient remains comfortable  Plan: Await disposition per Palliative Care Team  Charles [name] 10/24/2014 8:04 AM

## 2014-10-24 NOTE — Progress Notes (Signed)
Discussed care of patient with Dr. Cornelius Moras. Due to no hospice facility bed availability I will assume the care of this patient and continue to support in full comfort care here in the hospital. I spoke with his Merwyn Katos who is willing to expand the geographic area for hospice facilities to ensure Weston Brass gets the best possible EOL care.  Anderson Malta, DO Palliative Medicine

## 2014-10-24 NOTE — Consult Note (Signed)
[facility] Liaison: (late entry from 10/23/14 - could not access EPIC)  Spoke with patient's nephew Tawanna Cooler yesterday by phone to confirm interest in [facility]. Answered his questions and offered support. He deferred me to patient's close friend Casimiro Needle and requested I call Casimiro Needle this morning which I will do. Tawanna Cooler has my contact information and I encouraged him to give information to Casimiro Needle. Tawanna Cooler reports both of them are feeling a little overwhelmed. He expressed appreciation for this call. Will follow up with Casimiro Needle today. Will update CSW and Dr. Phillips Odor re availability later this morning. Thank you. Forrestine Him, LCSW [phone]

## 2014-10-24 NOTE — Clinical Social Work Note (Signed)
Thursday, 10/24/2014 CSW contacted by 2S RNCM regarding change in discharge disposition. Per RNCM, Palliative team now recommending residential hospice placement at any available facility. CSW has made appropriate referrals to all surrounding residential hospice facilities. RNCM updated.  Wednesday, 10/23/2014 CSW received referral for comfort care and possible residential hospice placement. CSW contacted palliative care team to confirm discharge disposition discussed with family. Palliative team confirmed disposition for [facility], however, if no beds available at [facility] for patient to be evaluated for GIP status. CSW made appropriate referral to [facility] admissions liaison. CSW also updated RNCM regarding request for GIP evaluation as [facility] had no available beds.  Barrier to discharge: Residential hospice facilities have stated that admission on 10/24/2014 (if patient accepted and bed available) could potentially be difficult due to patient's family living out of state and no family local to assist with admission paperwork.  Marcelline Deist, [place] - [phone] Clinical Social Work Department Orthopedics [phone]) and Surgical [phone])

## 2014-10-24 NOTE — Progress Notes (Signed)
Palliative team following closely. Palliative team will assist with discharge details and comfort care plan. Please call Dr. Phillips Odor with questions related to symptom management or care coordination [phone]. He has required 2 boluses of IV morphine for comfort and is on scheduled Robinul for secretions. He requires frequent oral care and repositioning for his comfort. Call from [facility] after hours- I will contact him this AM with an update.   Anderson Malta, DO Palliative Medicine

## 2014-10-24 NOTE — Discharge Summary (Signed)
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, without macular edema, with retinopathy   Mitral regurgitation   Chronic combined systolic and diastolic congestive heart failure   Physical deconditioning   S/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 + CABG x 4 + maze procedure   S/P CABG x 4   S/P Maze operation for atrial fibrillation   Pressure ulcer   Chronic atrial fibrillation   Discharge Condition: Terminal, comfort care  Diet recommendation: NPO, mouth care  Filed Weights   10/20/14 0600 10/21/14 0500 10/22/14 0450  Weight: 134.4 kg (296 lb 4.8 oz) 126.7 kg (279 lb 5.2 oz) 123.2 kg (271 lb 9.7 oz)    History of present illness:  "Charles 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 He underwent elective CABG+AVR+Maze on 8/17. Following surgery he did not regain his presurgical mental status and was noted to have right sided hemiparesis. Subsequent imaging demonstrated a large incomplete left hemispheric stroke.  The palliative medicine team was consult it to confirm goals of care and to assist with possible transition to comfort care.  I met with patient's nephe w Charles [name] and his lifelong friend Charles [name] who share  HCPOA and their spouses to discuss goals of care. I also provided gentle but straightforward information to Charles [name] directly at the bedside.  Charles [name] had explicitly outlined his wishes to his legal surrogates regarding the type of care he would want to receive if he had a serious or debilitating illness or injury. He was known as a "talker", he was a Emergency planning/management officer and loved to play guitar- he was always known to be "fun and social" and also loved to "eat". He was also fiercely independent and had told his family and close friends that if he were unable to care for himself that he would not want his life prolonged. He had additionally told them that he would never want to be in a nursing home and he would not want any type of artificial feeding tube. This left hemispheric stroke will unfortunately take away the life activities that he had the most joy in- talking, eating and being social.  Code Status/Advance Care Planning:  DNR, allow for natural death to occur  Symptom Management:   Palliative EOL order set  PRN Morphine for pain and dyspnea  Palliative Prophylaxis: Bowel regimen, Nausea  Given size of stroke I am concerned about seizure activity especially as he becomes hypoxic- I will give him a small dose of diazepam this evening for prophylaxis  Additional Recommendations (Limitations, Scope, Preferences):  We have been fortunate enough to have had detailed and significant pre-surgical conversations with this gentleman regarding his preferences for QOL in the face of his baseline serious illness and in the event of a serious complication as he has had following surgery.  I appreciate  the CVTS team for having these discussions and for consulting palliative care based on this gentleman's stated preferences.  Any additional medical interventions geared towards "prolonging his life" would not be in line with his expressed goals of care and prolong his inevitable death- this  gentleman was given every best chance to get better with surgery, but has suffered unexpected but serious complications from his underlying chronic diseases following major CVTS surgery done to help his already failing QOL.  Discontinued feeding tube per family request- this will not prevent aspiration, he will not eat again and PEG would never be an option.  Discontinued O2, not contributing to comfort, allow for natural death to occur is most compassionate approach and use opiates PRN for signs of distress or syspnea  Oral care and comfort feeding only.  Hospital Course:  As above.  Procedures:  Pre-operative studies reviewed.  He is s/p CABG,AVR,MAZE, External Pacer wires present  Consultations:  Palliative Care  CVTS primary  CCM ventilator  Discharge Exam: Filed Vitals:   10/24/14 1600  BP: 143/54  Pulse:   Temp:   Resp: 21    General: opens his eyes to verbal stimulation, very weak Cardiovascular: Bradycardic Respiratory: +course rhonchi Neuro: Dense right hemiparesis, neglect and aphasia, severe dysphagia Very dry oral mucosa  Discharge Instructions Comfort care per [facility].  Discharge Instructions    Discharge instructions    Complete by:  As directed   Symptom management at end of life with [facility]          Current Discharge Medication List    START taking these medications   Details  LORazepam (ATIVAN) 2 MG/ML injection Inject 0.5 mLs (1 mg total) into the vein every 4 (four) hours as needed for anxiety. Qty: 1 mL, Refills: 0    morphine 2 MG/ML injection Inject 1 mL (2 mg total) into the vein every hour as needed. Qty: 1 mL, Refills: 0    ondansetron (ZOFRAN) 4 MG/2ML SOLN injection Inject 2 mLs (4 mg total) into the vein every 6 (six) hours as needed for nausea or vomiting. Qty: 2 mL, Refills: 0    polyvinyl alcohol (LIQUIFILM TEARS) 1.4 % ophthalmic solution Place 1 drop into both eyes 4 (four) times daily as  needed for dry eyes. Qty: 15 mL, Refills: 0      STOP taking these medications     Aflibercept 2 MG/0.05ML SOLN      amiodarone (PACERONE) 200 MG tablet      aspirin EC 81 MG tablet      atenolol (TENORMIN) 50 MG tablet      hydrochlorothiazide (HYDRODIURIL) 25 MG tablet      Insulin Isophane & Regular Human (HUMULIN 70/30 KWIKPEN) (70-30) 100 UNIT/ML PEN      lisinopril (PRINIVIL,ZESTRIL) 20 MG tablet      metFORMIN (GLUCOPHAGE) 1000 MG tablet      minocycline (MINOCIN,DYNACIN) 100 MG capsule      nitroGLYCERIN (NITROSTAT) 0.4 MG SL tablet      albuterol (PROVENTIL HFA;VENTOLIN HFA) 108 (90 BASE) MCG/ACT inhaler      B-D INS SYRINGE 0.5CC/30GX1/2" 30G X 1/2" 0.5 ML MISC      Blood Glucose Monitoring Suppl (TRUETRACK BLOOD GLUCOSE) W/DEVICE KIT      glucose blood (TRUETRACK TEST) test strip      Respiratory Therapy Supplies (FLUTTER) DEVI        No Known Allergies    The results of significant diagnostics from this  hospitalization (including imaging, microbiology, ancillary and laboratory) are listed below for reference.    Significant Diagnostic Studies: Dg Chest 1 View  10/18/2014   CLINICAL DATA:  CABG.  Aortic valve replacement.  EXAM: CHEST  1 VIEW  COMPARISON:  10/17/2014.  FINDINGS: Endotracheal tube has been advanced, its is tip is 2.3 cm above the carina. NG tube, Swan-Ganz catheter, mediastinal drainage catheter, bilateral chest tubes in stable position. Prior CABG and aortic valve replacement. Stable cardiomegaly. Low lung volumes with basilar atelectasis. Increased interstitial prominence noted. Mild component congestive heart failure cannot be excluded. Small left pleural effusion again noted. No pneumothorax.  IMPRESSION: 1. Endotracheal tube has been advanced. Its tip is 2.3 cm above the carina. Remaining lines and tubes in stable position. 2. Prior CABG and aortic valve replacement. Persistent cardiomegaly. Increased interstitial markings suggesting mild  congestive heart failure. 3. Low lung volumes with bibasilar atelectasis.   Electronically Signed   By: Marcello Moores  Register   On: 10/18/2014 07:41   Dg Chest 2 View  10/14/2014   CLINICAL DATA:  Preop chest exam for aortic stenosis. Coronary artery disease. Atrial fibrillation.  EXAM: CHEST  2 VIEW  COMPARISON:  CT 08/12/2014  FINDINGS: Mild increased interstitial markings. Peribronchial thickening. Findings likely reflect bronchitis. No confluent opacities or effusions. Heart is normal size. No acute bony abnormality.  IMPRESSION: Probable bronchitic changes.   Electronically Signed   By: Rolm Baptise M.D.   On: 10/14/2014 14:29   Dg Abd 1 View  10/21/2014   CLINICAL DATA:  Feeding tube placement  EXAM: ABDOMEN - 1 VIEW  COMPARISON:  None.  FINDINGS: Single spot image is submitted demonstrating a feeding tube at the ligament of Treitz. Contrast was injected to verify placement.  IMPRESSION: Feeding tube tip at the ligament of Treitz.   Electronically Signed   By: Rolm Baptise M.D.   On: 10/21/2014 12:09   Ct Head Wo Contrast  10/20/2014   CLINICAL DATA:  Acute left middle cerebral artery territory infarction. Worsening mental status, not following commands. Follow-up for changes.  EXAM: CT HEAD WITHOUT CONTRAST  TECHNIQUE: Contiguous axial images were obtained from the base of the skull through the vertex without intravenous contrast.  COMPARISON:  10/18/2014  FINDINGS: Very similar appearance to the study of 2 days ago. Low-density affecting the left temporal lobe, posterior frontal lobe, parietal lobe and basal ganglia consistent with near complete left MCA territory infarction. Low-density in swelling with mass effect upon the left lateral ventricle but only 2-3 mm of left-to-right shift. No evidence of hemorrhagic transformation. No other acute intracranial infarction. Brainstem and cerebellum are normal. Mild chronic small-vessel changes of the right cerebral hemisphere. No ventricular trapping.   IMPRESSION: No significant change since the study of 2 days ago. Low-density in swelling throughout the left MCA territory consistent with subtotal left MCA territory infarction. Mass effect with left-to-right shift of only 2-3 mm. No hemorrhagic transformation.   Electronically Signed   By: Nelson Chimes M.D.   On: 10/20/2014 11:08   Ct Head Wo Contrast  10/18/2014   CLINICAL DATA:  Right side flaccid; altered mental status  EXAM: CT HEAD WITHOUT CONTRAST  TECHNIQUE: Contiguous axial images were obtained from the base of the skull through the vertex without intravenous contrast.  COMPARISON:  None.  FINDINGS: There is mild underlying atrophy. There is cytotoxic edema throughout essentially the entire left middle cerebral artery distribution with impression on the left lateral ventricle. There is no midline shift. There is  no hemorrhage or well-defined mass. There is no subdural or epidural fluid. Elsewhere there is mild small vessel disease in the centra semiovale bilaterally. No other acute infarct evident. There is increased attenuation in the left middle cerebral artery consistent with thrombosis of this vessel.  The bony calvarium appears intact. The mastoid air cells are clear. There is bilateral ethmoid air cell disease. There is a small retention cyst in the posterior right maxillary antrum  IMPRESSION: Acute infarct involving essentially the entire left middle cerebral artery distribution with partial effacement of the left lateral ventricle. There is increased attenuation in the left middle cerebral artery consistent with thrombosis of this vessel. No midline shift. No hemorrhage. There is mild underlying atrophy with mild periventricular small vessel disease.  Critical Value/emergent results were called by telephone at the time of interpretation on 10/18/2014 at 12:57 pm to Dr. Darylene Price , who verbally acknowledged these results.   Electronically Signed   By: Lowella Grip III M.D.   On:  10/18/2014 12:57   Dg Chest Port 1 View  10/21/2014   CLINICAL DATA:  Status post aortic valve replacement and CABG on October 16, 2014  EXAM: PORTABLE CHEST - 1 VIEW  COMPARISON:  Portable chest x-ray of October 20, 2014  FINDINGS: The lungs are adequately inflated. The retrocardiac region on the left remains dense and the left hemidiaphragm obscured. The cardiac silhouette is mildly enlarged. The pulmonary vascularity is prominent centrally but slightly less engorged. The patient is rotated on this study. There are 8 intact sternal wires. A left atrial appendage clip is present. Two coronary artery bypass graft markers are visible. The prosthetic aortic valve is faintly visible.  IMPRESSION: Slight interval improvement in the appearance of the pulmonary vascularity since yesterday's study. There is persistent left lower lobe atelectasis and small left pleural effusion.   Electronically Signed   By: David  Martinique M.D.   On: 10/21/2014 07:47   Dg Chest Port 1 View  10/20/2014   CLINICAL DATA:  Patient with shortness of breath.  EXAM: PORTABLE CHEST - 1 VIEW  COMPARISON:  Chest radiograph 10/19/2014  FINDINGS: ET tube terminates in the mid trachea. Right IJ central venous catheter sheath projects over the superior vena cava. Enteric tube courses inferior to the diaphragm. Bilateral chest tubes stable in position. Stable cardiomegaly with bilateral interstitial opacities. Small bilateral pleural effusions. No definite pneumothorax.  IMPRESSION: Stable support apparatus.  Stable cardiomegaly with interstitial edema and small effusions.   Electronically Signed   By: Lovey Newcomer M.D.   On: 10/20/2014 09:48   Dg Chest Port 1 View  10/19/2014   CLINICAL DATA:  Patient status post CABG.  Aortic valve replacement.  EXAM: PORTABLE CHEST - 1 VIEW  COMPARISON:  Chest radiograph 10/17/2014  FINDINGS: Interval retraction pulmonary arterial catheter. Right IJ sheath stable in position. ET tube terminates in the distal  trachea, unchanged from prior. Enteric tube courses inferior to the diaphragm. Bilateral chest tubes in place. Stable enlarged cardiac and mediastinal contours status post median sternotomy. Unchanged retrocardiac pulmonary consolidation and heterogeneous opacities within right lung base. No definite pneumothorax.  IMPRESSION: Interval retraction pulmonary arterial catheter. Otherwise stable support apparatus.  Re- demonstrated bilateral left-greater-than-right lower lung heterogeneous opacities represent atelectasis. Infection not excluded.   Electronically Signed   By: Lovey Newcomer M.D.   On: 10/19/2014 11:03   Dg Chest Port 1 View  10/17/2014   CLINICAL DATA:  CABG and aortic valve replacement.  EXAM: PORTABLE CHEST -  1 VIEW  COMPARISON:  10/16/2014.  FINDINGS: What may represent endotracheal tube tip is at the top of the image at the level of the the thoracic inlet, clinical correlation is suggested. More distal repositioning should be considered if the endotracheal tube has not been removed. NG tube, Swan-Ganz catheter, mediastinal drainage catheter, bilateral chest tubes in stable position. Prior CABG and aortic valve replacement. Stable cardiomegaly. Low lung volumes with mild basilar atelectasis. Left lower lobe infiltrate cannot be excluded. Small left pleural effusion cannot be excluded. No pneumothorax.  IMPRESSION: 1. What may represent the endotracheal tube tip is noted at the top of the image at the level of the thoracic inlet, clinical correlation suggested. More distal repositioning should be considered if the endotracheal tube has not been removed. Remaining lines and tubes in stable position. 2. Low lung volumes with bibasilar atelectasis. Left lower lobe infiltrate and small left pleural effusion cannot be excluded . Findings stable from prior exam. 3. Prior CABG and aortic valve replacement. Stable cardiomegaly. No pulmonary venous congestion. Critical Value/emergent results were called by  telephone at the time of interpretation on 10/17/2014 at 7:37 am to nurse Altamese Dilling, who verbally acknowledged these results.   Electronically Signed   By: Marcello Moores  Register   On: 10/17/2014 07:41   Dg Chest Port 1 View  10/16/2014   CLINICAL DATA:  Status post CABG and aortic valve replacement today.  EXAM: PORTABLE CHEST - 1 VIEW  COMPARISON:  CT chest 08/12/2014.  PA and lateral chest 10/14/2014.  FINDINGS: Bilateral chest tubes are in place. No pneumothorax identified. Endotracheal tube is seen with the tip at the level of clavicular heads, well above the carina. NG tube courses into the stomach. Right IJ approach Swan-Ganz catheter tip is in the proximal right main pulmonary artery. Mediastinal drain is noted. There is left basilar atelectasis and there is likely a small left effusion. No right effusion is noted. There is no pulmonary edema.  IMPRESSION: Support apparatus projects in good position. Negative for pneumothorax.  Left basilar atelectasis and likely small effusion.   Electronically Signed   By: Inge Rise M.D.   On: 10/16/2014 19:01   Dg Addison Bailey G Tube Plc W/fl-no Rad  10/21/2014   CLINICAL DATA:    NASO G TUBE PLACEMENT WITH FLUORO  Fluoroscopy was utilized by the requesting physician.  No radiographic  interpretation.     Microbiology: No results found for this or any previous visit (from the past 240 hour(s)).   Labs: Basic Metabolic Panel:  Recent Labs Lab 10/19/14 0518  10/20/14 0557 10/20/14 1607 10/21/14 0419 10/21/14 2109 10/22/14 0506  NA 139  < > 140 138 140 141 140  K 3.1*  < > 2.7* 3.1* 3.2* 3.7 3.4*  CL 101  < > 101 100* 97* 97* 96*  CO2 26  --  28  --  31 33* 33*  GLUCOSE 105*  < > 74 90 93 181* 201*  BUN 24*  < > 27* 27* 27* 32* 33*  CREATININE 1.51*  < > 1.45* 1.50* 1.37* 1.46* 1.39*  CALCIUM 7.7*  --  7.5*  --  7.4* 7.4* 7.5*  < > = values in this interval not displayed. Liver Function Tests:  Recent Labs Lab 10/18/14 0500  AST 136*  ALT 23   ALKPHOS 39  BILITOT 1.6*  PROT 4.7*  ALBUMIN 2.9*   No results for input(s): LIPASE, AMYLASE in the last 168 hours. No results for input(s): AMMONIA in the last  168 hours. CBC:  Recent Labs Lab 10/18/14 0500 10/19/14 0518 10/19/14 1807 10/20/14 0557 10/20/14 1607 10/21/14 0419 10/22/14 0506  WBC 15.6* 13.8*  --  11.8*  --  13.2* 11.7*  HGB 9.0* 9.0* 8.8* 8.4* 9.5* 9.5* 9.8*  HCT 25.7* 26.6* 26.0* 25.0* 28.0* 29.3* 30.1*  MCV 85.7 88.4  --  89.0  --  91.3 92.0  PLT 101* 93*  --  116*  --  152 208   Cardiac Enzymes: No results for input(s): CKTOTAL, CKMB, CKMBINDEX, TROPONINI in the last 168 hours. BNP: BNP (last 3 results) No results for input(s): BNP in the last 8760 hours.  ProBNP (last 3 results) No results for input(s): PROBNP in the last 8760 hours.  CBG:  Recent Labs Lab 10/22/14 1218 10/22/14 1537 10/22/14 1923 10/22/14 2345 10/23/14 0353  GLUCAP 188* 199* 186* 187* 201*       Signed:  GOLDING,ELIZABETH  [facility] 10/24/2014, 5:21 PM

## 2014-10-24 NOTE — Progress Notes (Signed)
Report called to Hewitt Shorts RN at [facility]. # [phone]. All questions answered. Awaiting Gold form and transport team.  Jacqulyn Cane RN, BSN, CCRN

## 2014-10-24 NOTE — Consult Note (Signed)
[facility] Referral for Hospice Home at [facility] received from [place], [place]. Spoke with nephew/Co-HCPOA Gwenlyn Found, and friend/Co-HCPOA  Vertell Novak via phone separately to confirm interest in [facility]. (Both live in [place].) Reviewed hospice philosophy and scope of services that are consistent with Goals of Care. Levels of Care also reviewed. The patient has been approved for Hospice Home at [facility] by Dr. Oleta Mouse and can be admitted there today, pending completion of Admission Documents by nephew, Tawanna Cooler. Plan: Admission documents were e-mailed to [facility] at his request and he will fax them to [facility] when he completes. When completed  Admission documents are received, Marcelline Deist, SW will be notified so that ambulance can be called for transport.  Thank you for this referral.  Nelson Chimes, RN, [facility]  [phone]

## 2014-10-24 NOTE — Clinical Social Work Note (Signed)
Patient to be discharged to [facility] of [facility]. Patient's family updated via Hospice Home admissions liaison.  Packet complete and placed on chart. RN to secure GOLD DNR form from Palliative team. (Per RN GOLD DNR form removed by Palliative team to obtain signature).  RN documented report has been called to [facility] of [employer]. Patient to be transported via EMS. CSW has arranged for EMS transportation.  Marcelline Deist, [place] - [phone] Clinical Social Work Department Orthopedics [phone]) and Surgical [phone])

## 2014-10-25 ENCOUNTER — Telehealth: Payer: Self-pay | Admitting: *Deleted

## 2014-10-25 NOTE — Telephone Encounter (Signed)
Pt was on tcm list d/c 8/25 dx terminal comfort care. Pt was d/c to [facility]...Raechel Chute

## 2014-10-31 DEATH — deceased

## 2016-12-01 IMAGING — NM NM MYOCAR MULTI W/ SPECT
3 series · 18 of 18 positions shown · non-contrast
Comparison: none

[Series 1: rest_(id)_sa · 6.5mm · 6.51mm/px · 6 of 64 frames shown]
[frame 6/64]
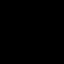
[frame 16/64]
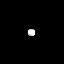
[frame 27/64]
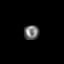
[frame 38/64]
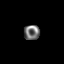
[frame 48/64]
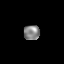
[frame 59/64]
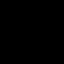

[Series 1: stress_(id)_sa · 6.5mm · 6.51mm/px · 6 of 512 frames shown (1 of 2)]
[frame 43/512]
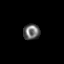
[frame 128/512]
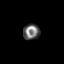
[frame 214/512]
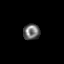
[frame 299/512]
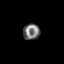
[frame 384/512]
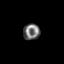
[frame 470/512]
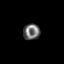

[Series 1: stress_(id)_sa · 6.5mm · 6.51mm/px · 6 of 64 frames shown (2 of 2)]
[frame 6/64]
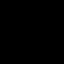
[frame 16/64]
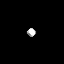
[frame 27/64]
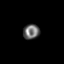
[frame 38/64]
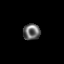
[frame 48/64]
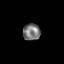
[frame 59/64]
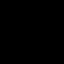

[18 of 18 positions shown; findings below may reference images not displayed]

Canned report from images found in remote index.

Refer to host system for actual result text.
